# Patient Record
Sex: Male | Born: 1955 | Race: Black or African American | Hispanic: No | Marital: Single | Smoking: Current every day smoker
Health system: Southern US, Community
[De-identification: ages and names within clinical notes are randomized; demographics above are authoritative.]

## PROBLEM LIST (undated history)

## (undated) DIAGNOSIS — I10 Essential (primary) hypertension: Secondary | ICD-10-CM

## (undated) DIAGNOSIS — E785 Hyperlipidemia, unspecified: Secondary | ICD-10-CM

## (undated) DIAGNOSIS — Z9989 Dependence on other enabling machines and devices: Secondary | ICD-10-CM

## (undated) DIAGNOSIS — I2510 Atherosclerotic heart disease of native coronary artery without angina pectoris: Secondary | ICD-10-CM

## (undated) DIAGNOSIS — G4733 Obstructive sleep apnea (adult) (pediatric): Secondary | ICD-10-CM

## (undated) DIAGNOSIS — E669 Obesity, unspecified: Secondary | ICD-10-CM

## (undated) HISTORY — DX: Obesity, unspecified: E66.9

## (undated) HISTORY — DX: Atherosclerotic heart disease of native coronary artery without angina pectoris: I25.10

## (undated) HISTORY — DX: Obstructive sleep apnea (adult) (pediatric): G47.33

## (undated) HISTORY — DX: Dependence on other enabling machines and devices: Z99.89

## (undated) HISTORY — DX: Hyperlipidemia, unspecified: E78.5

## (undated) HISTORY — PX: NO PAST SURGERIES: SHX2092

## (undated) HISTORY — PX: CARDIAC CATHETERIZATION: SHX172

## (undated) HISTORY — DX: Essential (primary) hypertension: I10

---

## 2001-02-23 ENCOUNTER — Emergency Department (HOSPITAL_COMMUNITY): Admission: EM | Admit: 2001-02-23 | Discharge: 2001-02-23 | Payer: Self-pay | Admitting: Emergency Medicine

## 2002-01-29 ENCOUNTER — Emergency Department (HOSPITAL_COMMUNITY): Admission: EM | Admit: 2002-01-29 | Discharge: 2002-01-29 | Payer: Self-pay

## 2002-01-30 ENCOUNTER — Encounter: Payer: Self-pay | Admitting: Emergency Medicine

## 2002-01-30 ENCOUNTER — Emergency Department (HOSPITAL_COMMUNITY): Admission: EM | Admit: 2002-01-30 | Discharge: 2002-01-30 | Payer: Self-pay | Admitting: Emergency Medicine

## 2006-05-21 ENCOUNTER — Emergency Department (HOSPITAL_COMMUNITY): Admission: EM | Admit: 2006-05-21 | Discharge: 2006-05-21 | Payer: Self-pay | Admitting: Emergency Medicine

## 2006-06-28 ENCOUNTER — Encounter: Admission: RE | Admit: 2006-06-28 | Discharge: 2006-08-02 | Payer: Self-pay | Admitting: Internal Medicine

## 2006-07-17 ENCOUNTER — Emergency Department (HOSPITAL_COMMUNITY): Admission: EM | Admit: 2006-07-17 | Discharge: 2006-07-17 | Payer: Self-pay | Admitting: Emergency Medicine

## 2006-09-07 ENCOUNTER — Ambulatory Visit: Payer: Self-pay

## 2006-09-07 ENCOUNTER — Encounter: Payer: Self-pay | Admitting: Cardiology

## 2006-10-12 ENCOUNTER — Ambulatory Visit (HOSPITAL_COMMUNITY): Admission: RE | Admit: 2006-10-12 | Discharge: 2006-10-12 | Payer: Self-pay | Admitting: Cardiology

## 2006-10-12 ENCOUNTER — Encounter (INDEPENDENT_AMBULATORY_CARE_PROVIDER_SITE_OTHER): Payer: Self-pay | Admitting: Cardiology

## 2008-01-19 ENCOUNTER — Emergency Department (HOSPITAL_COMMUNITY): Admission: EM | Admit: 2008-01-19 | Discharge: 2008-01-19 | Payer: Self-pay | Admitting: Emergency Medicine

## 2008-04-14 ENCOUNTER — Ambulatory Visit: Payer: Self-pay | Admitting: Cardiology

## 2008-04-14 ENCOUNTER — Inpatient Hospital Stay (HOSPITAL_COMMUNITY): Admission: AD | Admit: 2008-04-14 | Discharge: 2008-04-26 | Payer: Self-pay | Admitting: Cardiology

## 2008-04-14 ENCOUNTER — Encounter: Payer: Self-pay | Admitting: Emergency Medicine

## 2008-04-16 ENCOUNTER — Ambulatory Visit: Payer: Self-pay | Admitting: Thoracic Surgery (Cardiothoracic Vascular Surgery)

## 2008-04-16 ENCOUNTER — Encounter: Payer: Self-pay | Admitting: Surgery

## 2008-04-16 ENCOUNTER — Encounter: Payer: Self-pay | Admitting: Cardiology

## 2008-04-17 ENCOUNTER — Encounter: Payer: Self-pay | Admitting: Cardiothoracic Surgery

## 2008-04-29 ENCOUNTER — Ambulatory Visit: Payer: Self-pay | Admitting: Internal Medicine

## 2008-04-30 ENCOUNTER — Encounter: Payer: Self-pay | Admitting: Internal Medicine

## 2008-04-30 ENCOUNTER — Inpatient Hospital Stay (HOSPITAL_COMMUNITY): Admission: EM | Admit: 2008-04-30 | Discharge: 2008-05-07 | Payer: Self-pay | Admitting: Emergency Medicine

## 2008-05-21 ENCOUNTER — Ambulatory Visit (HOSPITAL_COMMUNITY): Admission: RE | Admit: 2008-05-21 | Discharge: 2008-05-21 | Payer: Self-pay | Admitting: Cardiology

## 2008-05-21 ENCOUNTER — Encounter: Payer: Self-pay | Admitting: Physician Assistant

## 2008-05-21 ENCOUNTER — Ambulatory Visit: Payer: Self-pay | Admitting: Cardiology

## 2008-05-30 ENCOUNTER — Encounter: Admission: RE | Admit: 2008-05-30 | Discharge: 2008-05-30 | Payer: Self-pay | Admitting: Cardiothoracic Surgery

## 2008-05-30 ENCOUNTER — Ambulatory Visit: Payer: Self-pay | Admitting: Cardiothoracic Surgery

## 2008-06-04 ENCOUNTER — Ambulatory Visit: Payer: Self-pay | Admitting: Cardiovascular Disease

## 2008-06-18 ENCOUNTER — Encounter (HOSPITAL_COMMUNITY): Admission: RE | Admit: 2008-06-18 | Discharge: 2008-07-18 | Payer: Self-pay | Admitting: Cardiology

## 2008-07-02 ENCOUNTER — Ambulatory Visit: Payer: Self-pay | Admitting: Cardiology

## 2008-09-22 ENCOUNTER — Ambulatory Visit: Payer: Self-pay | Admitting: Cardiology

## 2008-09-22 ENCOUNTER — Ambulatory Visit (HOSPITAL_COMMUNITY): Admission: RE | Admit: 2008-09-22 | Discharge: 2008-09-22 | Payer: Self-pay | Admitting: Cardiology

## 2008-09-23 LAB — CONVERTED CEMR LAB
Cholesterol: 144 mg/dL
Triglyceride fasting, serum: 229 mg/dL

## 2008-11-16 ENCOUNTER — Ambulatory Visit: Admission: RE | Admit: 2008-11-16 | Discharge: 2008-11-16 | Payer: Self-pay | Admitting: Internal Medicine

## 2008-11-22 ENCOUNTER — Ambulatory Visit: Payer: Self-pay | Admitting: Internal Medicine

## 2008-12-22 ENCOUNTER — Ambulatory Visit: Payer: Self-pay | Admitting: Cardiology

## 2009-02-01 ENCOUNTER — Ambulatory Visit: Admission: RE | Admit: 2009-02-01 | Discharge: 2009-02-01 | Payer: Self-pay | Admitting: Internal Medicine

## 2009-02-08 ENCOUNTER — Ambulatory Visit: Payer: Self-pay | Admitting: Internal Medicine

## 2009-05-06 ENCOUNTER — Encounter (INDEPENDENT_AMBULATORY_CARE_PROVIDER_SITE_OTHER): Payer: Self-pay | Admitting: *Deleted

## 2009-05-12 DIAGNOSIS — I10 Essential (primary) hypertension: Secondary | ICD-10-CM | POA: Insufficient documentation

## 2009-05-12 DIAGNOSIS — G4733 Obstructive sleep apnea (adult) (pediatric): Secondary | ICD-10-CM

## 2009-05-12 DIAGNOSIS — E119 Type 2 diabetes mellitus without complications: Secondary | ICD-10-CM

## 2009-05-12 DIAGNOSIS — I5032 Chronic diastolic (congestive) heart failure: Secondary | ICD-10-CM

## 2009-05-12 DIAGNOSIS — E785 Hyperlipidemia, unspecified: Secondary | ICD-10-CM

## 2009-05-12 DIAGNOSIS — E663 Overweight: Secondary | ICD-10-CM | POA: Insufficient documentation

## 2009-05-12 DIAGNOSIS — E782 Mixed hyperlipidemia: Secondary | ICD-10-CM | POA: Insufficient documentation

## 2009-06-04 ENCOUNTER — Encounter (INDEPENDENT_AMBULATORY_CARE_PROVIDER_SITE_OTHER): Payer: Self-pay | Admitting: *Deleted

## 2009-07-20 ENCOUNTER — Telehealth (INDEPENDENT_AMBULATORY_CARE_PROVIDER_SITE_OTHER): Payer: Self-pay | Admitting: *Deleted

## 2009-08-21 ENCOUNTER — Ambulatory Visit: Payer: Self-pay | Admitting: Cardiology

## 2009-08-21 ENCOUNTER — Encounter (INDEPENDENT_AMBULATORY_CARE_PROVIDER_SITE_OTHER): Payer: Self-pay | Admitting: *Deleted

## 2009-08-21 DIAGNOSIS — I2510 Atherosclerotic heart disease of native coronary artery without angina pectoris: Secondary | ICD-10-CM | POA: Insufficient documentation

## 2009-08-21 LAB — CONVERTED CEMR LAB
AST: 12 units/L
Albumin: 4.3 g/dL
BUN: 34 mg/dL
CO2: 21 meq/L
Calcium: 9.5 mg/dL
Chloride: 101 meq/L
Creatinine, Ser: 1.041 mg/dL
HCT: 38.1 %
Platelets: 229 10*3/uL
Potassium: 4.9 meq/L
Sodium: 134 meq/L
TSH: 1.029 microintl units/mL
Total Protein: 8 g/dL
WBC: 9.6 10*3/uL

## 2009-09-02 ENCOUNTER — Telehealth (INDEPENDENT_AMBULATORY_CARE_PROVIDER_SITE_OTHER): Payer: Self-pay | Admitting: *Deleted

## 2009-09-15 ENCOUNTER — Encounter: Payer: Self-pay | Admitting: Cardiology

## 2009-09-15 ENCOUNTER — Encounter (INDEPENDENT_AMBULATORY_CARE_PROVIDER_SITE_OTHER): Payer: Self-pay | Admitting: *Deleted

## 2009-09-15 LAB — CONVERTED CEMR LAB
ALT: 9 units/L
AST: 11 units/L
Alkaline Phosphatase: 88 units/L
BUN: 26 mg/dL
Brain Natriuretic Peptide: 87.8
Creatinine, Ser: 1.24 mg/dL
Total Protein: 7.7 g/dL

## 2009-09-16 ENCOUNTER — Encounter (INDEPENDENT_AMBULATORY_CARE_PROVIDER_SITE_OTHER): Payer: Self-pay | Admitting: *Deleted

## 2009-09-16 LAB — CONVERTED CEMR LAB
ALT: 9 units/L (ref 0–53)
AST: 11 units/L (ref 0–37)
Albumin: 4.2 g/dL (ref 3.5–5.2)
Alkaline Phosphatase: 88 units/L (ref 39–117)
Glucose, Bld: 175 mg/dL — ABNORMAL HIGH (ref 70–99)
Potassium: 5.4 meq/L — ABNORMAL HIGH (ref 3.5–5.3)
Sodium: 137 meq/L (ref 135–145)
Total Protein: 7.7 g/dL (ref 6.0–8.3)

## 2009-09-21 ENCOUNTER — Ambulatory Visit: Payer: Self-pay | Admitting: Cardiology

## 2009-10-27 ENCOUNTER — Encounter (INDEPENDENT_AMBULATORY_CARE_PROVIDER_SITE_OTHER): Payer: Self-pay | Admitting: *Deleted

## 2009-10-27 LAB — CONVERTED CEMR LAB
BUN: 21 mg/dL
BUN: 21 mg/dL (ref 6–23)
CO2: 25 meq/L
CO2: 25 meq/L (ref 19–32)
Calcium: 9.4 mg/dL
Calcium: 9.4 mg/dL (ref 8.4–10.5)
Creatinine, Ser: 1.23 mg/dL (ref 0.40–1.50)
Glucose, Bld: 125 mg/dL
Glucose, Bld: 125 mg/dL — ABNORMAL HIGH (ref 70–99)
Sodium: 138 meq/L (ref 135–145)

## 2009-10-29 ENCOUNTER — Encounter (INDEPENDENT_AMBULATORY_CARE_PROVIDER_SITE_OTHER): Payer: Self-pay | Admitting: *Deleted

## 2010-04-01 ENCOUNTER — Encounter (INDEPENDENT_AMBULATORY_CARE_PROVIDER_SITE_OTHER): Payer: Self-pay | Admitting: *Deleted

## 2010-04-09 ENCOUNTER — Encounter (INDEPENDENT_AMBULATORY_CARE_PROVIDER_SITE_OTHER): Payer: Self-pay | Admitting: *Deleted

## 2010-04-09 ENCOUNTER — Ambulatory Visit: Payer: Self-pay | Admitting: Cardiology

## 2010-04-13 ENCOUNTER — Telehealth (INDEPENDENT_AMBULATORY_CARE_PROVIDER_SITE_OTHER): Payer: Self-pay | Admitting: *Deleted

## 2010-04-13 ENCOUNTER — Encounter (INDEPENDENT_AMBULATORY_CARE_PROVIDER_SITE_OTHER): Payer: Self-pay | Admitting: *Deleted

## 2010-04-13 LAB — CONVERTED CEMR LAB
ALT: 9 units/L (ref 0–53)
AST: 10 units/L (ref 0–37)
Albumin: 4 g/dL (ref 3.5–5.2)
CO2: 25 meq/L (ref 19–32)
Calcium: 9.6 mg/dL (ref 8.4–10.5)
Chloride: 103 meq/L (ref 96–112)
Cholesterol: 131 mg/dL (ref 0–200)
Creatinine, Ser: 1.26 mg/dL (ref 0.40–1.50)
Eosinophils Absolute: 0.2 10*3/uL (ref 0.0–0.7)
Eosinophils Relative: 3 % (ref 0–5)
HCT: 39.8 % (ref 39.0–52.0)
Lymphocytes Relative: 55 % — ABNORMAL HIGH (ref 12–46)
Lymphs Abs: 4.3 10*3/uL — ABNORMAL HIGH (ref 0.7–4.0)
MCV: 85.4 fL (ref 78.0–100.0)
Monocytes Relative: 9 % (ref 3–12)
Neutrophils Relative %: 33 % — ABNORMAL LOW (ref 43–77)
Potassium: 5.3 meq/L (ref 3.5–5.3)
RBC: 4.66 M/uL (ref 4.22–5.81)
Total CHOL/HDL Ratio: 4.4
WBC: 7.9 10*3/uL (ref 4.0–10.5)

## 2010-06-12 ENCOUNTER — Encounter (INDEPENDENT_AMBULATORY_CARE_PROVIDER_SITE_OTHER): Payer: Self-pay | Admitting: *Deleted

## 2010-06-12 LAB — CONVERTED CEMR LAB
BUN: 19 mg/dL
BUN: 19 mg/dL (ref 6–23)
CO2: 28 meq/L
CO2: 28 meq/L (ref 19–32)
Calcium: 9.3 mg/dL
Chloride: 105 meq/L (ref 96–112)
Creatinine, Ser: 1.27 mg/dL
Glucose, Bld: 120 mg/dL
Glucose, Bld: 120 mg/dL — ABNORMAL HIGH (ref 70–99)
Potassium: 5.2 meq/L (ref 3.5–5.3)
Sodium: 139 meq/L
Sodium: 139 meq/L (ref 135–145)

## 2010-06-14 ENCOUNTER — Encounter (INDEPENDENT_AMBULATORY_CARE_PROVIDER_SITE_OTHER): Payer: Self-pay | Admitting: *Deleted

## 2010-08-02 ENCOUNTER — Encounter (INDEPENDENT_AMBULATORY_CARE_PROVIDER_SITE_OTHER): Payer: Self-pay | Admitting: *Deleted

## 2010-08-11 ENCOUNTER — Telehealth (INDEPENDENT_AMBULATORY_CARE_PROVIDER_SITE_OTHER): Payer: Self-pay | Admitting: *Deleted

## 2010-08-13 ENCOUNTER — Encounter (INDEPENDENT_AMBULATORY_CARE_PROVIDER_SITE_OTHER): Payer: Self-pay | Admitting: *Deleted

## 2010-08-23 ENCOUNTER — Ambulatory Visit (HOSPITAL_COMMUNITY): Admission: RE | Admit: 2010-08-23 | Discharge: 2010-08-23 | Payer: Self-pay | Admitting: Oral Surgery

## 2010-10-01 ENCOUNTER — Encounter: Payer: Self-pay | Admitting: Cardiology

## 2010-11-29 ENCOUNTER — Encounter: Payer: Self-pay | Admitting: Cardiothoracic Surgery

## 2010-12-09 NOTE — Assessment & Plan Note (Signed)
Summary: 6 mth f/u per checkout on 08/21/09/tg  Medications Added LYRICA 100 MG CAPS (PREGABALIN) take 1 tab three times a day METFORMIN HCL 1000 MG TABS (METFORMIN HCL) take 1 tab two times a day ASPIRIN 81 MG TBEC (ASPIRIN) Take one tablet by mouth daily FUROSEMIDE 20 MG TABS (FUROSEMIDE) take 1 tab daily COMBIVENT 18-103 MCG/ACT AERO (IPRATROPIUM-ALBUTEROL) take 2 puffs two times a day      Allergies Added: NKDA  Visit Type:  Follow-up Primary Provider:  Dr. Concepcion Elk   History of Present Illness: [name] returns to the office as scheduled for continued assessment and treatment of coronary disease and cardiovascular risk factors.  He has done generally well, but does note daily edema, more prominent in the right leg, that resolves overnight.  He has occasional episodes of a sharp discomfort across the anterior mid chest lasting matter of seconds.  Lifestyle is sedentary, but he experiences some dyspnea with exertion and with bending.  Nonetheless, he does some Lobbyist and restoration.   Current Medications (verified): 1)  Diovan 320 Mg Tabs (Valsartan) .... Take 1 Tablet By Mouth Once A Day 2)  Lyrica 100 Mg Caps (Pregabalin) .... Take 1 Tab Three Times A Day 3)  Metformin Hcl 1000 Mg Tabs (Metformin Hcl) .... Take 1 Tab Two Times A Day 4)  Simvastatin 40 Mg Tabs (Simvastatin) .... Take 1 Tab Daily 5)  Glimepiride 4 Mg Tabs (Glimepiride) .... Take 1 Tab Two Times A Day 6)  Aspirin 81 Mg Tbec (Aspirin) .... Take One Tablet By Mouth Daily 7)  Toprol Xl 50 Mg Xr24h-Tab (Metoprolol Succinate) .... Take One  and One Half Tablets By Mouth Daily 8)  Furosemide 20 Mg Tabs (Furosemide) .... Take 1 Tab Daily 9)  Combivent 18-103 Mcg/act Aero (Ipratropium-Albuterol) .... Take 2 Puffs Two Times A Day  Allergies (verified): No Known Drug Allergies  Past History:  PMH, FH, and Social History reviewed and updated.  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  Social History: Disabled  Single  Tobacco Use - 40 pack years; quit in 6/09 Alcohol Use -remote excessive use  Review of Systems       See history of present illness.  Vital Signs:  Patient profile:   55 year old male Weight:      315 pounds Pulse rate:   80 / minute BP sitting:   123 / 79  (right arm)  Vitals Entered By: Dreama Saa, CNA (April 09, 2010 3:02 PM)  Physical Exam  General:    Well developed; no acute distress; obese   Neck-No JVD; no carotid bruits: Lungs-No tachypnea, no rales; no rhonchi; no wheezes; decreased breath sounds at the bases  Thorax- keloid formation in median sternotomy scar Cardiovascular-normal PMI; normal S1 and S2: Abdomen-BS normal; soft and non-tender without masses or organomegaly:  Musculoskeletal-No deformities, no cyanosis or clubbing: Neurologic-Normal cranial nerves; symmetric strength and tone:  Skin-Warm, no significant lesions; scattered minor scars over the trunk Extremities-intact distal pulses; 1+ ankle edema on the right and 1/2+ on the left.     Impression & Recommendations:  Problem # 1:   ATHEROSCLEROTIC CARDIOVASCULAR DISEASE (ICD-429.2) Patient has done well with respect to coronary disease.  He is currently asymptomatic with nothing to suggest ischemia.  Aspirin dosage will be reduced to 81 mg q.d.  Problem # 2:  OBSTRUCTIVE SLEEP APNEA (ICD-327.23)  He has what sounds like a BiPAP device, but uses it only intermittently.  After one month of steady usage, he noted no change in his sense of well-being.  He does not experience daytime somnolence.  Problem # 3:  OVERWEIGHT/OBESITY (ICD-278.02) He has lost 13 pounds since his last visit, which he accomplished by reducing beer consumption.  He also drinks only artificially sweetened beverages and avoids salt and high caloric items.  I suggested that he eliminate fried and fatty foods from his diet.  Problem # 4:  HYPERTENSION, UNSPECIFIED (ICD-401.9) Blood pressure control is good; current medication can be continued.  Problem # 5:  HYPERLIPIDEMIA-MIXED (ICD-272.4) Lipid profile and chemistry profile are pending.  I will plan to see this very nice gentleman again in one year.  Other Orders: Future Orders: T-Lipid Profile ([id number]) ... 04/12/2010 T-Comprehensive Metabolic Panel [phone]) ... 04/12/2010 T-CBC w/Diff ([id number]) ... 04/12/2010  Patient Instructions: 1)  Your physician recommends that you schedule a follow-up appointment in: 1 yr 2)  Your physician recommends that you return for lab work in: next week 3)  Your physician has recommended you make the following change in your medication: decrease aspirin to 81mg  daily b

## 2010-12-09 NOTE — Letter (Signed)
Summary: [place] [facility], agricultural at [employer]  [address] [place] [address]   Phone: [phone]  Fax: [phone]     April 13, 2010 MRN: [id number]   [name] [address] [place], [place]  [address]      YOUR LAB WORK IS DUE   __________August 8, 2011_______________________________  Please go to [facility], located across the street from [facility] on the second floor.  Hours are Monday - Friday 7am until 7:30pm         Saturday 8am until 12noon    __  DO NOT EAT OR DRINK AFTER MIDNIGHT EVENING PRIOR TO LABWORK  _x_ YOUR LABWORK IS NOT FASTING --YOU MAY EAT PRIOR TO LABWORK

## 2010-12-09 NOTE — Miscellaneous (Signed)
Summary: labs bmp,06/12/2010  Clinical Lists Changes  Observations: Added new observation of CALCIUM: 9.3 mg/dL (16/08/9603 5:40) Added new observation of CREATININE: 1.27 mg/dL (98/09/9146 8:29) Added new observation of BUN: 19 mg/dL (56/21/3086 5:78) Added new observation of BG RANDOM: 120 mg/dL (46/96/2952 8:41) Added new observation of CO2 PLSM/SER: 28 meq/L (06/12/2010 8:48) Added new observation of CL SERUM: 105 meq/L (06/12/2010 8:48) Added new observation of K SERUM: 5.2 meq/L (06/12/2010 8:48) Added new observation of NA: 139 meq/L (06/12/2010 8:48)

## 2010-12-09 NOTE — Progress Notes (Signed)
   Phone Note Call from Patient   Caller: [name] Call For: nurse Reason for Call: Refill Medication Details for Reason: called to tell us that his recent Rx had not been received by the pharmacy. Summary of Call: [name] called to tell the nurse that his most recent Rx had not been received by the pharmacy as of today. Initial call taken by: Enzo Montgomery. Clovis Riley  Follow-up for Phone Call        [name] to [facility] diovan to pts house Follow-up by: Teressa Lower RN,  April 13, 2010 5:06 PM

## 2010-12-09 NOTE — Letter (Signed)
Summary: Clearance Letter  [facility]  [address], [place] [address]   Phone: [phone]  Fax: [phone]    August 13, 2010  Re:     [name] Address:   [address]     [place], [place]  [address] DOB:     02/23/1956 MRN:     [id number]   Dear Dr. Ocie Doyne:       [name] is cleared for dental extractions with sedation.  He   is a low cardiac risk.  He should continue all his cardiac medications   as usual.  Please feel free to contact us if we can be of assistance.      Sincerely,  Joni Reining, NP

## 2010-12-09 NOTE — Miscellaneous (Signed)
Summary: labs cmp,bnp,09/15/2009  Clinical Lists Changes  Observations: Added new observation of CALCIUM: 9.4 mg/dL (16/08/9603 5:40) Added new observation of CREATININE: 1.23 mg/dL (98/09/9146 8:29) Added new observation of BUN: 21 mg/dL (56/21/3086 5:78) Added new observation of BG RANDOM: 125 mg/dL (46/96/2952 8:41) Added new observation of CO2 PLSM/SER: 25 meq/L (10/27/2009 8:48) Added new observation of CL SERUM: 103 meq/L (10/27/2009 8:48) Added new observation of K SERUM: 5.3 meq/L (10/27/2009 8:48) Added new observation of NA: 138 meq/L (10/27/2009 8:48) Added new observation of CALCIUM: 9.7 mg/dL (32/44/0102 7:25) Added new observation of ALBUMIN: 4.2 g/dL (36/64/4034 7:42) Added new observation of PROTEIN, TOT: 7.7 g/dL (59/56/3875 6:43) Added new observation of SGPT (ALT): 9 units/L (09/15/2009 8:48) Added new observation of SGOT (AST): 11 units/L (09/15/2009 8:48) Added new observation of ALK PHOS: 88 units/L (09/15/2009 8:48) Added new observation of CREATININE: 1.24 mg/dL (32/95/1884 1:66) Added new observation of BUN: 26 mg/dL (05/06/1600 0:93) Added new observation of BG RANDOM: 175 mg/dL (23/55/7322 0:25) Added new observation of CO2 PLSM/SER: 22 meq/L (09/15/2009 8:48) Added new observation of CL SERUM: 101 meq/L (09/15/2009 8:48) Added new observation of K SERUM: 5.4 meq/L (09/15/2009 8:48) Added new observation of NA: 137 meq/L (09/15/2009 8:48) Added new observation of BNP: 87.8  (09/15/2009 8:48)

## 2010-12-09 NOTE — Letter (Signed)
Summary: Ocie Doyne DMD OFFICE NOTES  SCOTT JENSEN DMD OFFICE NOTES   Imported By: Faythe Ghee 10/01/2010 13:52:56  _____________________________________________________________________  External Attachment:    Type:   Image     Comment:   External Document

## 2010-12-09 NOTE — Progress Notes (Signed)
Summary: Speak with nurse   Phone Note Call from Patient   Caller: Patient Reason for Call: Talk to Nurse Summary of Call: pt states that Dr.Scott Jensen's office was gonna send [place] information regarding being "put to sleep" for teeth extraction / explained to him that we have not yet received that so he wanted to speak with nurse /tg Initial call taken by: Raechel Ache [facility],  August 11, 2010 1:20 PM     Appended Document: Speak with nurse clearance letter generated and faxed to Dr. Barbette Merino

## 2010-12-09 NOTE — Letter (Signed)
Summary: [place] [facility], agricultural at [facility]  [address] [place] [address]   Phone: [phone]  Fax: [phone]      August 02, 2010 MRN: [id number]   DOC MANDALA [address] [place], [place]  [address]   Dear Mr. Gellis,  Your test ordered by Selena Batten has been reviewed by your physician (or physician assistant) and was found to be normal or stable. Your physician (or physician assistant) felt no changes were needed at this time.  ____ Echocardiogram  ____ Cardiac Stress Test  __x__ Lab Work  ____ Peripheral vascular study of arms, legs or neck  ____ CT scan or X-ray  ____ Lung or Breathing test  ____ Other:  No change in medical treatment at this time, per Dr. Dietrich Pates.  Enclosed is a copy of your labwork for your records.  Thank you, Tammy Allyne Gee RN    Sanger Bing, MD, Lenise Arena.C.Gaylord Shih, MD, F.A.C.C Lewayne Bunting, MD, F.A.C.C Nona Dell, MD, F.A.C.C Charlton Haws, MD, Lenise Arena.C.C

## 2010-12-09 NOTE — Letter (Signed)
Summary: [place] [facility] Work [facility], agricultural at [employer]  [address], [place] [address]   Phone: [phone]  Fax: [phone]     April 09, 2010 MRN: [id number]   MARQUAVIOUS NAZAR [address] [place], [place]  [address]      YOUR LAB WORK IS DUE   ____________MONDDAY   JUNE 6, 2011____________________________  Please go to [facility], located across the street from [facility] on the second floor.  Hours are Monday - Friday 7am until 7:30pm         Saturday 8am until 12noon    _X_  DO NOT EAT OR DRINK AFTER MIDNIGHT EVENING PRIOR TO LABWORK  __ YOUR LABWORK IS NOT FASTING --YOU MAY EAT PRIOR TO LABWORK

## 2010-12-09 NOTE — Letter (Signed)
Summary: [place] Results Engineer, agricultural at [facility]  [address], [place] [address]   Phone: [phone]  Fax: [phone]      April 13, 2010 MRN: [id number]   [name] [address] [place], [place]  [address]   Dear [name],  Your test ordered by Selena Batten has been reviewed by your physician (or physician assistant) and was found to be normal or stable. Your physician (or physician assistant) felt no changes were needed at this time.  ____ Echocardiogram  ____ Cardiac Stress Test  __X__ Lab Work  ____ Peripheral vascular study of arms, legs or neck  ____ CT scan or X-ray  ____ Lung or Breathing test  ____ Other: Please decrease the amount of potassium in your diet.  Enclosed is a copy of  your labwork for your records and a list of food high in potassium to avoid in your diet.  We will recheck more labwork in August.  Thank you, Teressa Lower RN    Glen Bing, MD, F.A.C.Gaylord Shih, MD, F.A.C.C Lewayne Bunting, MD, F.A.C.C Nona Dell, MD, F.A.C.C Charlton Haws, MD, Lenise Arena.C.C

## 2011-01-19 LAB — GLUCOSE, CAPILLARY: Glucose-Capillary: 156 mg/dL — ABNORMAL HIGH (ref 70–99)

## 2011-01-20 LAB — BASIC METABOLIC PANEL
BUN: 21 mg/dL (ref 6–23)
CO2: 30 mEq/L (ref 19–32)
Calcium: 9.6 mg/dL (ref 8.4–10.5)
Chloride: 103 mEq/L (ref 96–112)
Creatinine, Ser: 1.17 mg/dL (ref 0.4–1.5)
GFR calc Af Amer: >60 mL/min (ref >60)
Glucose, Bld: 125 mg/dL — ABNORMAL HIGH (ref 70–99)

## 2011-01-20 LAB — CBC
MCH: 26.9 pg (ref 26.0–34.0)
MCHC: 32.3 g/dL (ref 30.0–36.0)
MCV: 83.4 fL (ref 78.0–100.0)
Platelets: 195 10*3/uL (ref 150–400)
RBC: 5.05 MIL/uL (ref 4.22–5.81)

## 2011-03-22 NOTE — Op Note (Signed)
NAMELAEL, PILCH NO.:  [id number]   MEDICAL RECORD NO.:  [id number]           PATIENT TYPE:   LOCATION:                                 FACILITY:   PHYSICIAN:  Kerin Perna, M.D.  DATE OF BIRTH:  January 23, 1956   DATE OF PROCEDURE:  04/17/2008  DATE OF DISCHARGE:                               OPERATIVE REPORT   OPERATION:  1. Coronary artery bypass grafting x4 (left internal mammary artery      LAD, saphenous vein graft to ramus intermediate, saphenous vein      graft to circumflex marginal, and saphenous vein graft to right      coronary artery).  2. Coronary endarterectomy of the left anterior descending  3. Endoscopic saphenous vein harvest of the right leg.  4. Placement of left subclavian triple-lumen catheter for IV access.   SURGEON:  Kerin Perna, MD   ASSISTANTS:  1. Doree Fudge, PA  2. Rowe Clack, PA-C   ANESTHESIA:  General.   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   CLINICAL NOTE:  The patient is a 55 year old disabled diabetic smoker.  He has had progressive dyspnea and chest pain on exertion.  He was  admitted to the hospital and ruled out for MI, but a cardiac cath by Dr.  Gala Romney demonstrated severe critical left main stenosis of 95% with  subpleural stenosis of the right coronary and severe disease of the  circumflex circulation.  His EF was 45%, and he was felt to be candidate  for surgical evaluation.  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  However, it was felt that the anatomic stenosis of the  left main was so great that surgery should proceed without the benefit  of a Plavix washout of a few days.   I examined the patient in the CCU the evening prior surgery and reviewed  the results of his  cardiac cath with the patient.  I discussed the  indications, the benefits, and risks of coronary artery bypass grafting  as well as the alternatives to surgery.  He understood the major aspects  of the procedure including the location of the surgical incisions, the  use of general anesthesia and cardiopulmonary bypass, and the expected  postoperative recovery.  He understood due to his chronic smoking,  obesity, and diabetes that he would be at increased risk for  postoperative pulmonary problems as well as infections.  He also  understood that he would be at a risk for bleeding, blood transfusion  requirement, stroke, MI, and death.  He understood these issues and  agreed to proceed with surgery under what I felt was an informed  consent.   OPERATIVE FINDINGS:  The patient's body habitus made exposure of the  heart difficult.  The aorta was very short.  The heart was enlarged.  The right atrium was fairly  posterior.  There was a question of a patent  foramen ovale on an echo done in 2007; however, the TEE down in the  operating room showed the patent foramen ovale to be extremely small in  the range of 2-3 mm at most.  For that reason, it was not closed.  The  patient did require blood transfusion during surgery.  He also required  platelets and FFP after reversal of the heparin with protamine due to  persistent coagulation abnormalities from long-term Plavix therapy.  Due  to his diffuse and severe coronary disease which required a  endarterectomy of the LAD, his foreshortened aorta, and difficult vessel  exposure, redo surgery would be at extremely high risk if at all  possible.   PROCEDURE:  The patient was brought to the operative room, placed on the  operating table where general anesthesia was induced.  The chest,  abdomen, and legs were prepped with Betadine and draped as a sterile  field.  A sternal incision was made as the saphenous vein was harvested  endoscopically from  the right leg.  The left internal mammary artery was  harvested as a pedicle graft from its origin at the subclavian vessels.  There was a good vessel with excellent flow.  After the saphenous vein  had been adequately harvested, inspected, and found to be adequate, the  patient was heparinized.  The pericardium was opened and suspended.  Pursestrings were placed in the right atrium and in the ascending aorta,  and the patient was cannulated and placed on bypass.  Dual venous  drainage was used.  The coronaries were then identified and dissected  for grafting.  Cardioplegia catheters were placed for both antegrade and  retrograde cold blood cardioplegia, and the patient was cooled to 32  degrees.  The mammary artery and vein grafts were prepared for the  distal anastomoses.  The aortic cross-clamp was then applied and 1 L of  cold blood cardioplegia was delivered in split doses between the  antegrade aortic and retrograde coronary sinus catheters.  There was  good cardioplegic arrest and septal temperature dropped less than 15  degrees.  Cardioplegia was then delivered every 20 minutes or less while  the cross-clamp was applied.   The distal coronary anastomoses were then performed.  The first distal  anastomosis was to the distal RCA.  This was placed at the large RV  marginal branch with free communication to the vein channel of the right  coronary which was heavily calcified and was a poor target for placement  of the anastomosis.  A reverse saphenous vein measuring 4 mm in diameter  was sewn to the 2.0-mm coronary artery using running 7-0 Prolene.  There  was good flow through the graft.  The second distal anastomosis was to  the circumflex marginal.  This had a proximal 90% stenosis and was a 1.5-  mm diameter vessel.  A reverse saphenous vein which was approximately 4  mm in diameter was sewn end to side with running 7-0 Prolene with good  flow through the graft.  Cardioplegia was  redosed.  The third distal  anastomosis was to the ramus branch of the left coronary.  This had a  proximal left main stenosis as well as a proper vessel 70% stenosis, and  the reverse saphenous vein was sewn end to side with running 7-0  Prolene.  The fourth distal anastomosis was to the distal LAD.  It was  heavily calcified and deeply intramyocardial  more proximally.  After an  attempt to perform the anastomosis with the calcified plaque present and  being unsuccessful, I proceeded with a coronary endarterectomy of the  LAD with a good anatomic dissection of the intramural plaque which  feathered off to a tapered end distally.  The endarterectomy was  completed both distally and proximally, and the specimen submitted to  pathology.  After the plaque had been removed, the anastomosis was much  more straight forward and possible, and the left IMA was sewn end to  side with running 8-0 Prolene to the endarterectomized distal LAD and  there was excellent flow through the graft.  Cardioplegia was redosed,  and the pedicle was secured to the epicardium with 6-0 Prolene sutures.   After cardioplegia was redosed, the 3 proximal vein anastomoses were  placed on the ascending aorta using a 4.5-mm punch and running 6-0  Prolene.  Prior to tying down the final proximal anastomosis, air was  vented from the coronaries with a dose of retrograde warm blood  cardioplegia.  The final proximal anastomosis was tied.  The cross-clamp  was removed.  Air was aspirated from the vein graft.  These were checked  and had good flow and hemostasis was documented at the proximal distal  anastomoses.  The cardioplegia catheters were removed.  The patient was  rewarmed to 37 degrees.  The heart resumed a spontaneous rhythm.  Temporary pacing wires were applied.  The patient had been adequately  rewarmed and reperfused.  The lungs were re-expanded.  Ventilator was  resumed.  The patient was then weaned from bypass.   This was performed  on low-dose dopamine.  There was a good cardiac output and stable blood  pressure.  The transesophageal echo showed preserved LV function.  Protamine was administered without adverse reaction.  There was still  considerable coagulopathy from his preoperative Plavix, and the patient  received FFP and platelets with improvement in the coagulation function.  The mediastinum was irrigated with warm antibiotic irrigation.  The leg  incision had some persistent bleeding and was packed until after the  protamine and platelets had been administered.  The superior pericardial  fat was closed over the aorta and vein grafts.  Two mediastinal and a  left pleural chest tube were placed and brought out through separate  incisions.  The sternum was closed with interrupted steel wire.  The  pectoralis fascia was closed with a running #1 Vicryl.  The subcutaneous  and skin layers were closed in running Vicryl and sterile dressings were  applied.  I placed a left subclavian triple-lumen catheter before taking  the drapes off to provide this obese diabetic man with good central  venous access.   We then examined the leg which was still bleeding.  The distal leg  incision below the knee was opened and extended several centimeters.  There was some bleeding and a side branch which was a perforating vein  into the muscle compartment in the calf.  This was oversewn with some  figure-of-eight transfixion sutures which controlled the bleeding.  Next, the incision at the knee was opened more proximally due to  persistent bleeding.  There is some side branches here that were also  oversewn with a 4-0 Prolene transfixion suture.  These incisions were  then closed with interrupted 0 Vicryl for the subcutaneous layer and  interrupted 3-0 nylon for the skin.  Sterile dressings were then applied  on all surgical incisions, and the patient was transferred  the ICU in  critical but stable condition.   Total cardiopulmonary bypass time was  212 minutes with a cross-clamp time of 120 minutes.      Kerin Perna, M.D.  Electronically Signed     PV/MEDQ  D:  04/17/2008  T:  04/18/2008  Job:  [id number]   cc:   Bevelyn Buckles. Bensimhon, MD

## 2011-03-22 NOTE — Procedures (Signed)
NAMEJADARIAN, MCKAY              ACCOUNT NO.:  [id number]   MEDICAL RECORD NO.:  [id number]          PATIENT TYPE:  OUT   LOCATION:  SLEEP CENTER                 FACILITY:  [facility]   PHYSICIAN:  Clinton D. Maple Hudson, MD, FCCP, FACPDATE OF BIRTH:  1956-01-04   DATE OF STUDY:  02/01/2009                            NOCTURNAL POLYSOMNOGRAM   REFERRING PHYSICIAN:  Fleet Contras, M.D.   INDICATIONS FOR STUDY:  Hypersomnia with sleep apnea.   EPWORTH SLEEPINESS SCORE:  Epworth sleepiness score 2/24.  BMI 40.6.  Weight 308 pounds.  Height 73 inches.  Neck 18.5 inches.   MEDICATIONS:  Home medications are charted and reviewed.  A diagnostic  NPSG on November 16, 2008, recorded an AHI of 80.1 per hour.  CPAP  titration is requested.   SLEEP ARCHITECTURE:  Total sleep time 197 minutes with sleep efficiency  of 51.2%.  Stage I was 6.3%.  Stage II 54.7%.  Stage III 29.9%.  REM  9.1% of total sleep time.  Sleep latency 57 minutes.  REM latency 22  minutes.  Awake after sleep onset 131 minutes.  Arousal index 4.9.  No  bedtime medication was taken.  He was awake from approximately 1:15 a.m.  until 3:15 a.m.   RESPIRATORY DATA:  CPAP titration protocol.  CPAP was titrated to a  final pressure of 16 CWP.  AHI 80 per hour.  CPAP pressure was taken as  high as 18 CWP, AHI 90 per hour.  BiPAP was tried briefly at inspiratory  19, expiratory 15 CWP, AHI 75 per hour.  Best pressure and time response  recorded during this study appears to have been had CPAP of 8 CWP.  Recorded for 45 minutes with an AHI of 5.5 per hour.  The technician  commented that he did not tolerate CPAP well at 15 CWP, recorded for 19  minutes with an AHI of 14 per hour.  He had trouble with mouth breathing  despite using a chin strap.  He wore a medium ResMed Swift nasal pillows  mask.   OXYGEN DATA:  Moderate snoring with oxygen desaturation to a nadir of  85%.  Mean oxygen saturation through this CPAP titration was 92.6% on  room air.   CARDIAC DATA:  Sinus rhythm with occasional PVC.   MOVEMENT/PARASOMNIA:  No significant movement disturbance.   IMPRESSION/RECOMMENDATIONS:  1. The technician had difficulty obtaining optimum continuous positive      airway pressure/bilevel control.  The patient was wearing a nasal      pillows mask, medium ResMed Swift, with chin strap and heated      humidifier.  He may do better with a nasal oral mask design, since      apparently part of the problem was mouth breathing.  Suggest an      initial home trial at 8 CWP (AHI 5.5) and reevaluation for mask      style and size by home care      company.  He can be retitrated for secondary consideration of      optimum pressure, as he adapts to CPAP use.  2. A baseline diagnostic nocturnal polysomnogram on November 16, 2008  recorded an AHI of 80.1 per hour.      Clinton D. Maple Hudson, MD, [facility], FACP  Diplomate, Biomedical engineer of Sleep Medicine  Electronically Signed     CDY/MEDQ  D:  02/06/2009 12:55:56  T:  02/06/2009 22:45:19  Job:  [id number]

## 2011-03-22 NOTE — Assessment & Plan Note (Signed)
[facility]                       [place] CARDIOLOGY OFFICE NOTE   DONEVAN, BILLER                     MRN:          [id number]  DATE:07/02/2008                            DOB:          [birth year]    REFERRING PHYSICIAN:  Fleet Contras, MD   HISTORY OF PRESENT ILLNESS:  Mr. Lipschutz returns to the office, now  nearly 3 months following CABG surgery.  His postoperative course was  complicated by rehospitalization for fluid retention.  He has done well  since his subsequent discharge more than a month ago.  He reports no  chest discomfort nor dyspnea.  He has some numbness in the right leg.  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  He is also concerned about black stools.   CURRENT MEDICATION:  1. KCl 20 mEq b.i.d.  2. Metoprolol 25 mg b.i.d.  3. Furosemide 40 mg b.i.d.  4. Diovan 160 mg daily.  5. Glimepiride 4 mg daily.  6. Simvastatin 20 mg daily.  7. Lyrica 50 mg t.i.d.  8. Iron 150 mg b.i.d.   PHYSICAL EXAMINATION:  GENERAL:  Soft spoken gentleman with flat affect,  in no acute distress.  VITAL SIGNS:  The weight is 277, 20 pounds less than 5 weeks ago.  Blood  pressure 105/65, heart rate 80 and regular, respirations 14.  NECK:  No jugular venous distention.  LUNGS:  Few bibasilar rales.  CARDIAC:  Normal first and second heart sounds; fourth heart sound  present.  ABDOMEN:  Soft and nontender; no organomegaly.  EXTREMITIES:  No edema.   IMPRESSION:  Mr. Peruski is recovering well from bypass surgery.  I  told him that he could plan to return to work in 1-2 months, but he does  not seem inclined to do this.  We will stop his iron supplement after  his current prescription, which should permit his stool to return to a  normal color.  His simvastatin dose will be increased to 40 mg daily.  Lab tests will be obtained in 2-month with a return visit in 5 months.    Gerrit Friends. Dietrich Pates, MD, [facility]  Electronically Signed   RMR/MedQ  DD: 07/02/2008  DT: 07/03/2008  Job #: [id number]   cc:   Fleet Contras, M.D.

## 2011-03-22 NOTE — H&P (Signed)
NAMEBURDELL, PEED NO.:  [id number]   MEDICAL RECORD NO.:  [id number]          PATIENT TYPE:  INP   LOCATION:  3701                         FACILITY:  [facility]   PHYSICIAN:  Joellyn Rued, PA-C     DATE OF BIRTH:  25-May-1956   DATE OF ADMISSION:  04/14/2008  DATE OF DISCHARGE:                              HISTORY & PHYSICAL   BRIEF HISTORY:  [name] is a 55 year old African American male who  was transported via [place] from the [facility] Emergency Room to [facility] for further evaluation of his chest discomfort.  He states since  Saturday, he has noticed an anterior sharp sticking chest discomfort  that starts in the left anterior chest and radiates to the right  anterior chest.  This primarily occurs at night.  He states at any time  he lies down, the discomfort begins and it is relieved with sitting up  on the side of the bed.  He denies any associated nausea, vomiting,  diaphoresis, or shortness of breath.  There is no change with exertion,  movement, and burping.  He also states when he lies down he feels like  he is choking, and gives it an 11 on a scale of 0-10.  He has not tried  to take any medications to try to improve this discomfort.  He denies  prior occurrences.  Last night, he states that it was so bad that he did  not get any sleep.  He also states it is not pleuritic nor did it change  with upper body movement.  Currently it is a 0 on a scale of 0-10.   PAST MEDICAL HISTORY:  No known drug allergies.  Medications pior to  admission include, Diovan 160 mg daily, glimepiride 4 mg daily, Plavix  75 mg daily, and unknown inhaler p.r.n.  He has a history of diabetes  for which he does check his sugar every morning; history of  hypertension, that he does not check his blood pressure; hyperlipidemia,  unknown last checked; obesity; he also describes a history of a CVA  where he lost consciousness and has a residual left numbness and he is  not  clear where and when he was hospitalized for this.  He denies any  surgeries.  He specifically denies any history of myocardial infarction,  COPD, bleeding dyscrasias, renal dysfunction, and thyroid disorder.   SOCIAL HISTORY:  He resides in [place] alone in a trailer.  He is  single without children.  He is on disability from being a Curator.  He  smokes approximately 1 pack every 2 days and he has been doing so for 15  years.  He drinks beer daily and liquor 1 time a week.  He states he has  not smoked pot in over 15 years.  Denies herbal medications, specific  diet, or exercise program.   FAMILY HISTORY:  His mother is alive at the age of 19, alive and well.  His father died at age of 76, possible myocardial infarction with a  history of diabetes and hypertension.  He has 3 brothers  and 1 sister  who he states are all in good health.   REVIEW OF SYSTEMS:  In addition to the above, is notable for chronic  sinus congestion, reading glasses, poor dentition, chronic dyspnea on  exertion that has remained unchanged, orthopnea, chronic cough,  wheezing, chronic lower extremity edema, positive snoring, he has never  been evaluated for obstructive sleep apnea, urinary frequency and  nocturia, left-sided weakness and numbness.  All other systems were  unremarkable.   PHYSICAL EXAMINATION:  GENERAL:  Well nourished, well developed, obese  African American male in no acute distress.  VITAL SIGNS:  Temperature is 98.3, blood pressure is 125/66, pulse 89  and regular, respirations 18, 100% sat on 2 L, and Glucometer here at  [facility] is 111.  HEENT:  Unremarkable, except for poor dentition.  NECK:  Supple without thyromegaly, adenopathy, JVD, or carotid bruits.  CHEST:  Symmetrical excursion.  LUNGS:  Sounds were diminished, but clear to auscultation without rales,  rhonchi, or wheezing.  HEART:  PMI is not displaced.  Regular rate and rhythm.  I do appreciate  1/6 systolic ejection murmur,  best appreciated at left sternal border.  All pulses are symmetrical and intact.  I did not appreciate any  abdominal bruits.  SKIN:  Skin integument appears to be intact.  He has what appears to be  possible venous stasis changes in his lower extremities.  ABDOMEN:  Obese.  Bowel sounds present without organomegaly, masses, or  tenderness.  EXTREMITIES:  Negative cyanosis or clubbing.  He has trace edema.  MUSCULOSKELETAL:  Unremarkable.  NEURO:  Unremarkable.   LABORATORY DATA:  Chest x-ray at [facility] showed cardiomegaly, no  active disease.  I think his initial EKG was hooked up incorrectly in  regards to the limb leads.  However, second EKG at 11 o'clock showed  normal sinus rhythm, left axis deviation, early R-wave, and nonspecific  ST-T wave changes.  H&H is 12.8 and 36.5, platelets 223, WBC is 5.4,  sodium 133, potassium 5.0, BUN 29, creatinine 1.40, glucose 140, BNP  171, and D-dimer 0.39.  Point of care markers were unremarkable x3;  however, second point of care marker did show a troponin of 0.19.   IMPRESSION:  1. Atypical chest discomfort of uncertain etiology.  2. Hypertension.  3. Tobacco use.  4. Obesity.  5. Alcohol use.  6. Renal insufficiency.  7. Hyperglycemia with history of diabetes.  8. Abnormal EKG with an early R-wave, possibly indicative of old      posterior myocardial infarction, history as noted per past medical      history.   DISPOSITION:  Dr. Daleen Squibb reviewed the patient's history, spoke with, and  examined the patient and agrees with the above.  Given his abnormal EKG,  we will check an echocardiogram.  If he has wall motion abnormalities,  he will need a cardiac catheterization.  If he rules out for myocardial  infarction and his echocardiogram is unremarkable for wall motion  abnormalities, we will arrange an adenosine Myoview for further  evaluation.  I have asked for the echocardiogram to be done first thing  in the morning with early review  and tentatively scheduled him for an  adenosine Myoview.  We will continue him on his home medications.  In  addition, I have also started a beta blocker, statin, and Protonix.  We  will check his usual labs.      Joellyn Rued, PA-C     EW/MEDQ  D:  04/14/2008  T:  04/15/2008  Job:  [id number]   cc:   Fleet Contras, M.D.
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
## 2011-03-22 NOTE — Op Note (Signed)
NAMEMITHRAN, STRIKE NO.:  [id number]   MEDICAL RECORD NO.:  [id number]          PATIENT TYPE:  INP   LOCATION:  2310                         FACILITY:  [facility]   PHYSICIAN:  Burna Forts, M.D.DATE OF BIRTH:  [birth year]   DATE OF PROCEDURE:  04/17/2008  DATE OF DISCHARGE:                               OPERATIVE REPORT   INDICATIONS FOR PROCEDURE:  Ms. [name] is a 55 year old  gentleman who presents today for coronary artery bypass grafting.  There  is some suspicion he might have a patent foramen ovale.  We have been  asked to place a TEE probe to evaluate cardiac structures and function  with particular attention to look for the PFO.  He was brought to the OR  on the morning of surgery where under local anesthesia with sedation,  radial arterial and pulmonary lines were placed.  Taken to the OR for  routine induction of general anesthesia.  Probe was then lubricated,  protected and passed oropharyngeally into the stomach and then slightly  withdrawn for imaging of the cardiac structures.   PRE-CARDIOPULMONARY BYPASS EXAMINATION:  Left ventricle.  Medially seen  is the short axis view of the left ventricular chamber.  There was left  ventricular hypertrophy noted concentrically in this short axis view.  There is mildly diminished left ventricular function noted in the short  axis view with normal anterior, anterolateral and anteroseptal wall  contractility and function.  There is some mildly diminished posterior  and inferior wall function noted would be considered mildly hypokinetic.  Long axis view in the left ventricular chamber again reveals mild  decrease contractility in the posterior-inferior aspect.  No masses are  noted within and papillary muscles are well outlined.   Mitral valve.  There  is a thickened mitral valve apparatus appreciated.  Both leaflets are somewhat thickened but appeared to open appropriately  during diastolic filling and  cut appropriately just below the level of  the annulus diastolic injection.  Pulse wave Doppler is used to assess  mitral inflow which reveals a very sharply elevated E versus A wave  about two times E versus A.  There is noted decreased deceleration at  this time.  This is indicative of somewhat almost of a restricted  physiology of the left ventricle often associated with higher left  atrial pressures.  Color examination further reveals 1+ mitral  regurgitant flow centrally located.   Left atrium.  The articular chamber itself is mildly enlarged .  Pulmonary veins are not well visualized but we could not demonstrate any  reversal flow there.  The interatrial septum is interrogated carefully  and it reveals a very small in the mid low septal area a very small PFO  with a left-to-right flow.  It is not a turbulent flow.  It is a smooth  and very small PFO appreciated.   Aortic valve.  Normal, thin, trileaflet aortic valve.   Right ventricle.  Right ventricular chamber is somewhat enlarged in  size, contractility appears normal.   Tricuspid valve is visualized.  There is 1 to 1-1/2+ tricuspid  regurgitant  flow noted across the tricuspid valve.  Right atrial chamber  is of normal size and function.  Pulmonary artery catheter is noted  within.   The patient is placed on cardiopulmonary bypass.  This goes on for a  fairly significant period of time.  Due to some technical difficulties  however, coronary artery bypass grafting is carried out.  That is the  only operative procedure there performed  by Dr. Kathlee Nations Tright.  The  patient then rewarmed and separated from cardiopulmonary bypass with the  initial attempt.   </   POST CARDIOPULMONARY BYPASS EXAMINATION (LIMITED EXAM):  Left ventricle.  In the early bypass period, both short and long axis views of the left  ventricular chamber revealed mild-to-moderate diminished LV chamber  function in this early bypass.  However, with  time from separation of  cardiopulmonary bypass and with the addition of the inotrope, this left  ventricular chamber function was significantly improved with good  overall contractility appreciated both in the short axis and long axis  views.  The ejection fraction appeared normal about 40-45% easily.   Mitral valve again indeed interrogated and there is much diminished the  1+ mitral regurgitant flow that had been appreciated was now gone.  The  analysis of the tricuspid valve as well showed much diminished tricuspid  regurgitation now for almost about 1-1/2+ to almost trace to trivial.  The rest of the cardiac examination was as previously described without  any significant changes and the patient was returned to the Cardiac  Intensive Care Unit in stable condition.           ______________________________  Burna Forts, M.D.     JTM/MEDQ  D:  04/18/2008  T:  04/18/2008  Job:  [id number]

## 2011-03-22 NOTE — Assessment & Plan Note (Signed)
OFFICE VISIT   NEITHAN, DAY  DOB:  1956-03-24                                        May 30, 2008  CHART #:  [id number]   The patient is status post coronary artery bypass grafting x4 done by  Dr. Donata Clay on April 17, 2008.  He also had coronary endarterectomy of  the left anterior descending artery at this time as well.  Postoperatively, the patient did have some blood loss anemia, which he  required transfusions for.  He also developed acute tubular necrosis and  was placed on a renal dopamine drip and creatinine monitor.  Creatinine  at the time of discharge was 1.07 with a BUN of 30.  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  The patient's vision had improved prior to discharge.  He presents today for his followup office visit.  The patient states  that he had to be re-admitted to the hospital due to increasing  shortness of breath.  He was re-admitted under Dr. Daleen Squibb with acute on  chronic diastolic congestive heart failure.  The patient received  diuretics and improved and was discharged to home.  Since then, he has  seen Dr. Daleen Squibb in the office.  He has a followup appointment with him  this coming Monday, June 02, 2008.  The patient does plan to do cardiac  rehab when contacted.  He states that they should be contacting him  within the next week.  The patient is progressing well.  He is  ambulating in and around the house.  He says that his breathing has  improved.  He does complain of decreased urination.  He says he go about  2 times in a day.  He denies any urgency, frequency, dysuria, or  hematuria.  The patient denies any opening or drainage from any of his  incision sites,  nausea and vomiting, or significant incisional pain.  He  is off all pain medications.  The patient states that he has not smoked  or drank since surgery.   PHYSICAL EXAMINATION:  VITAL SIGNS:  Blood pressure 115/67, pulse is  98%, respirations of 18, and O2 sat 92% on room air.  RESPIRATORY:  Clear to auscultation bilaterally.  CARDIAC:  Regular rate and rhythm.  S1 and S2 noted.  ABDOMEN:  Bowel sounds x4.  Soft and nontender.  EXTREMITIES:  Positive peripheral edema, right greater than left.  Extremities are warm and well perfused.  Incisions are clean, dry, and  intact and healing well.  One stitch was removed from the upper sternum.  Neosporin placed.   STUDIES:  The patient had a PA and lateral chest x-ray done today, which  is clear showing no pneumonia, atelectasis, or pleural effusions.  Report from chest x-ray done on May 01, 2008, showed posterior aspects  of the left second and third rib fracture with an old fracture in the  posterior aspect of the left fifth rib.  There is an old fracture at  lateral aspect of the right sixth rib.  This is noted  on today's x-ray.   IMPRESSION AND PLAN:  The patient is status post coronary artery bypass  grafting x4 on April 17, 2008, by Dr. Donata Clay.  The patient was seen  and evaluated by Dr. Donata Clay.  Dr. Donata Clay did evaluate the  patient's PA and lateral chest x-ray.  The patient is progressing well.  He is encouraged to do cardiac rehab when contacted.  He is encouraged  to walk at least 10 minutes a day and increase in increments of 5  minutes.  The patient is to keep appointment with his cardiologist on  Monday, June 02, 2008.  He is to continue taking medications per his  cardiologist.  The patient was instructed the need of taking an aspirin  325 mg daily.  The patient is released to drive.  He is told do no heavy  lifting over 10 pounds for another month.  He is to continue watching  his incisions using soap and water.  We  will release the patient from  our office, but if he has any surgical issues, he is to contact us.  He  acknowledges understanding.  He is to continue to follow up with his  primary care physician for diabetes management.   Kerin Perna, M.D.  Electronically Signed   KMD/MEDQ  D:  05/30/2008  T:  05/31/2008  Job:  [id number]   cc:   Kerin Perna, M.D.  Thomas C. Wall, MD, [facility]

## 2011-03-22 NOTE — Letter (Signed)
December 22, 2008    Fleet Contras, MD  [address], [place] [address]   RE:  DEMON, VOLANTE  MRN:  [id number]  /  DOB:  1956-02-27   Dear Dr. Concepcion Elk:   Mr. Sherrod returns to the office for continued assessment and  treatment of coronary disease and cardiovascular risk factors, now  nearly 1 year following CABG surgery.  He continues to do well in  general.  He is exercising on a treadmill total of 20-40 minutes per day  without much difficulty.  He still notes dyspnea with some of his daily  activities.  He continues to refrain from cigarette smoking.  He has  never been asked to modify his diet, which does not sound ideal.  He has  had borderline hyperkalemia that has been stable over the past few  months as well as renal insufficiency exacerbated by diuretics.  He  notes some intermittent edema in his right leg and continued numbness  since surgery.   Current medications are unchanged from his last visit except for  discontinuation of his potassium supplement and iron, and decrease in  furosemide to 40 mg daily.   PHYSICAL EXAMINATION:  GENERAL:  Overweight pleasant gentleman in no  acute distress.  VITAL SIGNS:  Weight is 312, 14 pounds more than in November and 40  pounds above his lowest weight, blood pressure 130/90, heart rate 72 and  regular, respirations 12 and unlabored.  NECK:  No jugular venous distention; no carotid bruits.  LUNGS:  Clear.  Thorax:  Stable sternum; fairly prominent scar tissue  over his mid substernal incision; old scar on the right back.  CARDIAC:  Normal first and second heart sounds.  ABDOMEN:  Soft and nontender; no bruits.  EXTREMITIES:  1/2+ right ankle edema.   Recent laboratory is generally good.  Chest x-ray in November showed  minor chronic bronchitic changes.  Most recent chemistry profile in  December shows potassium of 5.2, glucose of 122 and BUN of 40 with a  creatinine of 1.4.  CBC in November was essentially  normal.  Lipid  profile was excellent with total cholesterol of 144, triglycerides of  229, HDL 31, and LDL of 67.  BNP level was 164.   IMPRESSION:  Mr. Wingard is doing well in general.  I doubt that his  exertional dyspnea is related to his heart disease.  He will continue to  gradually increase exercise time on his treadmill and attempt to lose  weight.  We will provide him with information concerning an appropriate  diabetic diet.  He is congratulated on continuing to refrain from  cigarette smoking.  Renal function has stabilized with a reduction in  his furosemide dose.  He had a recent sleep study ordered by Dr. Jetty Duhamel demonstrating significant sleep apnea.  A return visit for  treatment is planned.  I will recheck a chemistry profile in 4 months  and see this nice gentleman again in 8 months.   Thanks for allowing me to share in his care.    Sincerely,      Gerrit Friends. Dietrich Pates, MD, [facility]  Electronically Signed    RMR/MedQ  DD: 12/22/2008  DT: 12/23/2008  Job #: [id number]

## 2011-03-22 NOTE — Procedures (Signed)
[name], [name]              ACCOUNT NO.:  [id number]   MEDICAL RECORD NO.:  [id number]          PATIENT TYPE:  OUT   LOCATION:  SLEEP CENTER                 FACILITY:  [facility]   PHYSICIAN:  Clinton D. Maple Hudson, MD, FCCP, FACPDATE OF BIRTH:  21-Nov-1957   DATE OF STUDY:  11/16/2008                            NOCTURNAL POLYSOMNOGRAM   REFERRING PHYSICIAN:  Fleet Contras, MD   INDICATION FOR STUDY:  Hypersomnia with sleep apnea.   EPWORTH SLEEPINESS SCORE:  Epworth Sleepiness Score 1/24.  BMI 32.8.  Weight 235 pounds.  Height 71 inches.  Neck 18.5 inches.   MEDICATIONS:  Home medications are charted and reviewed.   SLEEP ARCHITECTURE:  Total sleep time 344 minutes with sleep efficiency  81.3%.  Stage I was 8.1%.  Stage II 77.4%.  Stage III 9.4%.  REM 5.1% of  total sleep time.  Sleep latency 28.5 minutes.  REM latency 283 minutes.  Awake after sleep onset 50.5 minutes.  Arousal index 4.0.  No bedtime  medication taken.   RESPIRATORY DATA:  Diagnostic NPSG study ordered.  Apnea-hypopnea index  (AHI) 80.1 per hour.  A total of 460 events were scored including 24  obstructive apneas and 436 hypopneas.  Events were not positional.  REM  AHI 82.3 per hour.  CPAP titration was not attempted because diagnostic  NPSG protocol was ordered.   OXYGEN DATA:  Moderately loud snoring with oxygen desaturation to a  nadir of 71%.  Mean oxygen saturation through the study was 91.4% on  room air.   CARDIAC DATA:  Sinus rhythm with PVCs.   MOVEMENT-PARASOMNIA:  No significant movement disturbance.  Bathroom x1.   IMPRESSIONS-RECOMMENDATIONS:  1. Sleep architecture fragmented by frequent brief wakings consistent      with respiratory disturbance, reduced percentage time in rapid eye      movement.  2. Severe obstructive sleep apnea/hypopnea syndrome, AHI 80.1 per hour      with loud snoring, nonpositional events and oxygen desaturation to      a nadir of 71% on room air.  A total of 40.1  minutes was recorded      with oxygen saturation less than 88% on room air.  3. Consider return for CPAP titration or evaluate for alternative      therapies as appropriate.  Formal retitration will      allow confirmation that CPAP corrects oxygen desaturation during      sleep.  Otherwise, consider overnight oximetry for this as      appropriate.      Clinton D. Maple Hudson, MD, [facility], FACP  Diplomate, Biomedical engineer of Sleep Medicine  Electronically Signed     CDY/MEDQ  D:  11/22/2008 10:06:14  T:  11/22/2008 22:09:58  Job:  [id number]
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
## 2011-03-22 NOTE — Letter (Signed)
September 22, 2008    Fleet Contras, MD  [address], [place] [address]   RE:  [name], [name]  MRN:  [id number]  /  DOB:  [birth year]   Dear Dr. Concepcion Elk:   [name] returns to the office for continued assessment and  treatment of coronary disease, now 5 months following CABG surgery.  Unfortunately, he was unable to complete the cardiac rehabilitation  program due to difficulties with transportation.  He has remained at  home, inactive and has gained a significant amount of weight.  He  reports dyspnea with mild exertion.  He has had no chest discomfort.  He  has chronic pedal edema that is unchanged.  He was last seen by you  approximately a month ago.  The good news is that he has discontinued  cigarette smoking.   Current medications are uncertain.  He was asked to bring in his  bottles, but he failed to do so.  He cannot describe his current regimen  from memory.   PHYSICAL EXAMINATION:  GENERAL:  Pleasant overweight gentleman in no  acute distress.  VITAL SIGNS:  The weight is 298, 21 pounds more than in August.  Blood  pressure 110/80, heart rate 72 and regular, respirations 16, O2  saturation 93% on room air.  NECK:  No jugular venous distention; no carotid bruits.  LUNGS:  Clear.  CARDIAC:  Normal first and second heart sounds.  Fourth heart sound  present.  ABDOMEN:  Soft and nontender; no organomegaly.  EXTREMITIES:  Pretibial edema 1+.   IMPRESSION:  [name] continues to do fairly well following coronary  artery bypass graft surgery.  It is likely that his exertional dyspnea  is related to inactivity and excessive weight.  We  will check a chest x-ray and a BNP level.  Other basic labs including  CBC, chemistry profile, and lipid profile will be obtained.  If results  are good, I will see this nice gentleman in 3 months.  We are attempting  to obtain records from your office and from the pharmacy to determine  his medical regime.    Sincerely,      Gabriel Friends. Dietrich Pates, MD, [facility]  Electronically Signed    RMR/MedQ  DD: 09/22/2008  DT: 09/23/2008  Job #: [id number]

## 2011-03-22 NOTE — Consult Note (Signed)
[name], SEAVEY NO.:  [id number]   MEDICAL RECORD NO.:  [id number]          PATIENT TYPE:  INP   LOCATION:  2310                         FACILITY:  [facility]   PHYSICIAN:  Melvyn Novas, M.D.  DATE OF BIRTH:  1956-10-26   DATE OF CONSULTATION:  DATE OF DISCHARGE:                                 CONSULTATION   HISTORY:  The patient was transferred from an outside hospital,  evaluation for chest pain.  Plan to undergo open heart surgery at that time.  Today on April 22, 2008, Dr. Dennie Maizes OR nurse called in this consult  for a 55 year old African American male named above, looking much older  than his numerical age.  He presents with new onset of blurred vision  and left-sided visual field loss.  Onset was according to the patient,  around 3 a.m. when he watched T.V. because he could not sleep.  He had noticed none of the vision impairment at 3 and at [facility],  when  again tried to watch T.V. he had blurred vision on complaint.    Now at 10 a.m., the patient states that he has had many times vision  changes before, but it is not very forthcoming nor organized with any  past medical history, visual history, and actual states that he does not  have an ophthalmologist, he regularly follows.  T  he patient has a history of an previous CVA and states that he has  residual left-sided numbness, he was at one time week on the left but  this has resolved he states.  The patient denies any pain, weakness, no  sensory loss, no motor loss, and states that his vision has just been  off as if there is a foreign object in his eye.  He has no diplopia, no fever, no range of motion deficits, no neck  rigidity, and associated tinnitus, swallowing, or speech difficulties.   PAST MEDICAL HISTORY:  The patient was admitted on transfer on April 14, 2008, for chest pain.  A cardiac catheterization showed multi-vessel coronary artery disease.  CABG followed on April 17, 2008.  He has  past medical history of  diabetes, hypertension, obesity, obstructive sleep apnea, CVA, and  previously a CABG x4.   SOCIAL HISTORY:  The patient lives at home, states he is independend.  He has family here in [place].   FAMILY HISTORY:  Diabetes mellitus, hypertension, and coronary artery  disease.   MEDICATIONS:  Plavix, glimepiride, and Diovan were given at home.   ALLERGIES:  He has no known medication allergies listed.   The patient had an unenhanced CT of the skull that showed no  abnormalities.   His labs were 137 for sodium, 4 for potassium, glucose in the morning  was 79.  INR is 1.4 and PT is 17.5.  H and H is remarkably low at 7.7  over 22.5.   Vital signs were 120/74 of blood pressure, heart rate of 86, which is  probably explained with the anemia, O2 of 100% saturation, does nasal  cannula on 2 liters, and respiratory rate that is still  elevated at 18  to 20.  The patient is not on any respiratory-assisted device that I can  see.  The patient is alert and oriented, but a poor historian.  He has  difficulty following simple-step commands through the neurologic exam  especially of his visual fields.  There is no evidence of any slurring  of speech.  There is no evidence of any discomfort or pain.  Cranial  nerve examination shows left-sided ptosis, left monocular upper left  quadrant loss by finger perimetry.  The full lower facial movements do not suggest any cranial nerve 7  lesion.  He has tongue and uvula in midline.  Full neck range of motion.  He can close both eyes but with unequal strength.  Motor examination  shows no drift in the left or right arm.  Finger-to-nose test was  performed, bilaterally equally, he can rotate and rapidly alternate  movements.  He had a drift in his right leg, but states that this hurts  because of his catheterization.  On sensory, left-sided facial, truncal,  and leg numbness in comparison to the right.  This is also confirmed   with simultaneous bilateral stimulation.  Coordination of the lower  extremities by heel-to-shin and by gait exam had to be deferred.   ASSESSMENT:  This is not likely a stroke-related deficit.  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  The patient has no central findings that are new in origin.    Monocular quadrant vision loss is only seen with antichiasmatic  lesions or incomplete recovery from an eye surgery.  Given the  associated ptosis I do think that the patient must have had eye surgery  in the past, also he is unable to give [place] a past medical history in  detail.      Melvyn Novas, M.D.  Electronically Signed     CD/MEDQ  D:  04/22/2008  T:  04/23/2008  Job:  [id number]

## 2011-03-22 NOTE — Discharge Summary (Signed)
NAMEDORSE, LOCY NO.:  [id number]   MEDICAL RECORD NO.:  [id number]           PATIENT TYPE:   LOCATION:                                 FACILITY:   PHYSICIAN:  Kerin Perna, M.D.  DATE OF BIRTH:  20-Aug-1956   DATE OF ADMISSION:  04/14/2008  DATE OF DISCHARGE:  04/26/2008                               DISCHARGE SUMMARY   ADMITTING DIAGNOSES:  1. Multivessel coronary artery disease (include a 95% left main      disease).  2. History of cerebrovascular accident.  3. History of diabetes mellitus.  4. History of hypertension.  5. History of renal insufficiency.  6. History of ethanol abuse.   DISCHARGE DIAGNOSES:  1. Multivessel coronary artery disease (include a 95% left main      disease).  2. History of cerebrovascular accident.  3. History of diabetes mellitus.  4. History of hypertension.  5. History of renal insufficiency.  6. History of ethanol abuse.  7. Postoperative blood loss anemia.  8. Postoperative acute tubular necrosis.   PROCEDURES:  1. Cardiac catheterization performed by Dr. Gala Romney on April 16, 2008.  2. Aortocoronary bypass x4 (LIMA to LAD, SVG to ramus intermediate,      SVG to circumflex marginal, and SVG to right coronary artery with      left anterior descending coronary endarterectomy).  3. EVH of right lower extremity with placement of drain and placement      of left subclavian triple-lumen catheter by Dr. Donata Clay on April 17, 2008.   HOSPITAL COURSE:  The patient is a 55 year old African American male  with a history of hypertension, diabetes mellitus, hyperlipidemia, and  CVA in 2007 who presented to [facility] with chest  discomfort.  The patient was transferred to [facility] for further  evaluation and treatment.  According to medical records, the patient was  placed on Plavix following his stroke in 2007.  At that time, a TEE was  performed that demonstrated a patent foramen ovale which  was never  closed.  The patient presented to [facility] with symptoms of substernal  chest discomfort for the last 5 days.  The pain occurred at rest and was  associated with shortness of breath and a choking sensation in his  neck.  EKG showed early R-wave suggestive of possible myocardial  infarction.  The patient did have elevated troponin levels that ranged  from 0.09-0.19.  The patient underwent cardiac catheterization on April 16, 2008, and was found to have critical left main stenosis of 95% with  subportal stenosis of the right coronary artery and severe disease of  the circumflex.  His EF was estimated at 45%.  It should be noted that  as previously stated the patient had a history of CVA and was on Plavix  prior to his admission.  Because of significant stenosis of the left  main, the patient underwent the aforementioned aortocoronary bypass CABG  surgery with delay.  As previously stated, there is a question of patent  foramen ovale on previous echo done in 2007.  A TEE done  intraoperatively showed the patent foramen ovale to be very small and 2-  3 mm at most, and therefore this was not closed.   Postoperatively, the patient had postoperative anemia.  He was given 1  unit packed red blood cells on postoperative day #1.  He also had ATN  postoperatively.  Renal dopamine was continued as well as a Lasix drip.  His creatinine was monitored closely.  The patient had some abdominal  distention postoperatively.  KUB was obtained on April 20, 2008, which  showed moderate colonic distention, no free air, which was consistent  with a colonic ileus.  The patient remained n.p.o.  He did have a bowel  movement on April 21, 2008, and April 22, 2008.  Liquids were started and  diet was increased as tolerated.  In addition, his creatinine continued  to decrease.  He continued to be anemic postoperatively (7.7 and 22.5).  On April 22, 2008, he was given other unit of packed red blood cells.   Also on this date, the patient had complaints of new-onset blurry vision  and left-sided visual field loss.  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  The patient's vision improved over the next couple of days.  He  continued to work with cardiac rehab.  Currently on postop day #8, the  patient had T-max of 100.5 and later became afebrile.  Vital signs were  stable.  He is still on 2 L nasal cannula oxygen at 97%.  His preop  weight was 132, today's weight is 134.6.  H&H are 8.2 and 23.9.  BUN and  creatinine are 30 and 1.07 respectively.   On physical exam, cardiovascular, regular rate and rhythm.  Pulmonary,  clear to auscultation bilaterally.  Abdomen is soft.  Bowel sounds are  present.  Extremities, right greater than left lower extremity edema.   The patient is going to be given Lasix for volume overload.  Creatinine  will be monitored.  He will be weaned off his oxygen as tolerated.  Continue to use incentive spirometry.  His fever today is likely  secondary to atelectasis, but the patient remains hemodynamically stable  and is weaned off his oxygen.  He will most likely be discharged on April 26, 2008.  Last chest x-ray done on April 24, 2008, which showed  cardiomegaly and bilateral effusions with mild bibasilar atelectasis.  Last CBC done on April 25, 2008, H&H are 8.2 and 22.1 respectively, white  count 5500, platelet count 221,000.  BMET last done today as well;  potassium 4.3, BUN and creatinine 30 and 1.07 respectively.  Labs will  be checked in the morning prior to discharge.   DISCHARGE INSTRUCTIONS:  No driving or lifting more than 10 pounds and  was instructed to do so.  Continue with breathing exercise daily.  Walk  everyday and increase frequency and duration as tolerated.  He is to  remain in a carbohydrate-modified medium caloric  diet.  He has an  appointment to see Dr. Eden Emms of [facility] on May 07, 2008.  In  addition, an appointment was made for the patient to seen Dr. Donata Clay  in 3 weeks.  Prior to this point, a chest x-ray will be obtained.   DISCHARGE MEDICATIONS:  1. EC asa 325 mg  p.o. daily.  2. Lopressor 12.5 mg p.o. twice daily.  3. Zocor 20 mg p.o. nightly.  4. Advair Discus inhaler 250/50 inhale 1 puff 2 times daily.  5. Oxycodone 5 mg 1-2 tablets p.o. q.4-6 hours as needed for pain.  6. Glimepiride 4 mg p.o. daily.   It will be determined on the day of discharge whether or not the patient  has to continue with several more days of Lasix or potassium.      Doree Fudge, [place]      Kerin Perna, M.D.  Electronically Signed    DZ/MEDQ  D:  04/25/2008  T:  04/26/2008  Job:  [id number]

## 2011-04-21 ENCOUNTER — Ambulatory Visit: Payer: Self-pay | Admitting: Adult Health

## 2011-04-29 ENCOUNTER — Encounter: Payer: Self-pay | Admitting: Adult Health

## 2011-04-29 ENCOUNTER — Ambulatory Visit (INDEPENDENT_AMBULATORY_CARE_PROVIDER_SITE_OTHER): Payer: Medicaid Other | Admitting: Adult Health

## 2011-04-29 DIAGNOSIS — I2510 Atherosclerotic heart disease of native coronary artery without angina pectoris: Secondary | ICD-10-CM

## 2011-04-29 DIAGNOSIS — E663 Overweight: Secondary | ICD-10-CM

## 2011-04-29 DIAGNOSIS — I10 Essential (primary) hypertension: Secondary | ICD-10-CM

## 2011-04-29 NOTE — Assessment & Plan Note (Signed)
He says his blood pressure at home is lower than our readings.  I have rechecked his BP in the exam room manuel.  It is equal to the initial blood pressure readings.  I have asked him to bring in his BP cuff from home to calibrate it with ours.  If he keeps a BP < 100 systolic, will need to decrease medications to avoid hypotension.

## 2011-04-29 NOTE — Assessment & Plan Note (Signed)
He is doing well from cardiac standpoint. He has lost 65 lbs and has become more active.  He is without complaint. He is due to see his PCP in 2 weeks for blood work.  Will have copies of this sent to [place] for review as well.  He will see [place] in 6 months unless symptomatic.

## 2011-04-29 NOTE — Assessment & Plan Note (Signed)
I have encouraged him to continue on his weight loss regimen. He has done very well with this.

## 2011-04-29 NOTE — Progress Notes (Signed)
HPI: [name] is a morbidly obese patient of Dr. Dietrich Pates we are following for ongoing assessment and treatment of CAD, and multiple CVRF. He is here for annual follow-up.  He has lost 65 lbs since last seen and continues to be active. He states he is feeling much better without recurrent chest pain.  He is managing his diabetes well and has actually not had to take metformin as his BG is running in the 70's in the am.  He is due to see his PCP in two weeks. He is more active. He is without complaint.  No Known Allergies  Current Outpatient Prescriptions  Medication Sig Dispense Refill  . aspirin 81 MG tablet Take 81 mg by mouth daily.        Marland Kitchen glimepiride (AMARYL) 4 MG tablet Take 4 mg by mouth daily before breakfast.        . lisinopril-hydrochlorothiazide (PRINZIDE,ZESTORETIC) 20-12.5 MG per tablet Take 1 tablet by mouth daily.        . metFORMIN (GLUMETZA) 1000 MG (MOD) 24 hr tablet Take 1,000 mg by mouth 2 (two) times daily.        . metoprolol (TOPROL-XL) 50 MG 24 hr tablet Take 50 mg by mouth daily. Take 1 1/2 tab daily         Past Medical History  Diagnosis Date  . Coronary artery disease   . Hypertension   . Hyperlipidemia   . OSA on CPAP   . Obesity   . Diabetes mellitus   . Diastolic heart failure     History reviewed. No pertinent past surgical history.  AOZ:HYQMVH of systems complete and found to be negative unless listed above PHYSICAL EXAM BP 129/80  Pulse 72  Ht 6\' 1"  (1.854 m)  Wt 265 lb (120.203 kg)  BMI 34.96 kg/m2  SpO2 96% General: Well developed, well nourished, obese in no acute distress Head: Eyes PERRLA, No xanthomas.   Normal cephalic and atramatic  Lungs: Clear bilaterally to auscultation and percussion. Heart: HRRR S1 S2, .  Pulses are 2+ & equal.            No carotid bruit. No JVD.  No abdominal bruits. No femoral bruits. Abdomen: Bowel sounds are positive, abdomen soft and non-tender without masses or                  Hernia's noted. Msk:   Back normal, normal gait. Normal strength and tone for age. Extremities: No clubbing, cyanosis or edema.  DP +1 Neuro: Alert and oriented X 3. Psych:  Good affect, responds appropriately  EKG: NSR, rate of 68 bpm. RBBB with evidence of infero/lateral infarct age undetermined.  ASSESSMENT AND PLAN

## 2011-04-29 NOTE — Patient Instructions (Signed)
**Note De-identified Cinch Ormond Obfuscation** Your physician recommends that you continue on your current medications as directed. Please refer to the Current Medication list given to you today.  Your physician recommends that you schedule a follow-up appointment in: 6 months  

## 2011-05-06 ENCOUNTER — Encounter: Payer: Self-pay | Admitting: Adult Health

## 2011-06-30 ENCOUNTER — Other Ambulatory Visit: Payer: Self-pay | Admitting: Cardiology

## 2011-07-12 ENCOUNTER — Other Ambulatory Visit: Payer: Self-pay

## 2011-07-12 ENCOUNTER — Emergency Department (HOSPITAL_COMMUNITY): Payer: Medicaid Other

## 2011-07-12 ENCOUNTER — Inpatient Hospital Stay (HOSPITAL_COMMUNITY): Payer: Medicaid Other

## 2011-07-12 ENCOUNTER — Inpatient Hospital Stay (HOSPITAL_COMMUNITY)
Admission: EM | Admit: 2011-07-12 | Discharge: 2011-07-13 | DRG: 069 | Disposition: A | Discharge status: Home / Self Care | Payer: Medicaid Other | Attending: Internal Medicine | Admitting: Internal Medicine

## 2011-07-12 ENCOUNTER — Encounter (HOSPITAL_COMMUNITY): Payer: Self-pay

## 2011-07-12 DIAGNOSIS — I5032 Chronic diastolic (congestive) heart failure: Secondary | ICD-10-CM | POA: Diagnosis present

## 2011-07-12 DIAGNOSIS — E119 Type 2 diabetes mellitus without complications: Secondary | ICD-10-CM | POA: Diagnosis present

## 2011-07-12 DIAGNOSIS — G459 Transient cerebral ischemic attack, unspecified: Secondary | ICD-10-CM | POA: Diagnosis present

## 2011-07-12 DIAGNOSIS — Z720 Tobacco use: Secondary | ICD-10-CM | POA: Diagnosis present

## 2011-07-12 DIAGNOSIS — R209 Unspecified disturbances of skin sensation: Secondary | ICD-10-CM

## 2011-07-12 DIAGNOSIS — I679 Cerebrovascular disease, unspecified: Secondary | ICD-10-CM | POA: Diagnosis present

## 2011-07-12 DIAGNOSIS — I69998 Other sequelae following unspecified cerebrovascular disease: Secondary | ICD-10-CM

## 2011-07-12 DIAGNOSIS — I639 Cerebral infarction, unspecified: Secondary | ICD-10-CM | POA: Diagnosis present

## 2011-07-12 DIAGNOSIS — F17200 Nicotine dependence, unspecified, uncomplicated: Secondary | ICD-10-CM | POA: Diagnosis present

## 2011-07-12 DIAGNOSIS — E875 Hyperkalemia: Secondary | ICD-10-CM | POA: Diagnosis present

## 2011-07-12 DIAGNOSIS — I509 Heart failure, unspecified: Secondary | ICD-10-CM | POA: Diagnosis present

## 2011-07-12 DIAGNOSIS — I517 Cardiomegaly: Secondary | ICD-10-CM

## 2011-07-12 DIAGNOSIS — E782 Mixed hyperlipidemia: Secondary | ICD-10-CM | POA: Diagnosis present

## 2011-07-12 DIAGNOSIS — G4733 Obstructive sleep apnea (adult) (pediatric): Secondary | ICD-10-CM | POA: Diagnosis present

## 2011-07-12 DIAGNOSIS — E11628 Type 2 diabetes mellitus with other skin complications: Secondary | ICD-10-CM | POA: Diagnosis present

## 2011-07-12 DIAGNOSIS — I2510 Atherosclerotic heart disease of native coronary artery without angina pectoris: Secondary | ICD-10-CM | POA: Diagnosis present

## 2011-07-12 DIAGNOSIS — I10 Essential (primary) hypertension: Secondary | ICD-10-CM | POA: Diagnosis present

## 2011-07-12 LAB — GLUCOSE, CAPILLARY
Glucose-Capillary: 129 mg/dL — ABNORMAL HIGH (ref 70–99)
Glucose-Capillary: 129 mg/dL — ABNORMAL HIGH (ref 70–99)
Glucose-Capillary: 215 mg/dL — ABNORMAL HIGH (ref 70–99)
Glucose-Capillary: 221 mg/dL — ABNORMAL HIGH (ref 70–99)

## 2011-07-12 LAB — COMPREHENSIVE METABOLIC PANEL
CO2: 24 mEq/L (ref 19–32)
Calcium: 9.4 mg/dL (ref 8.4–10.5)
Creatinine, Ser: 1.27 mg/dL (ref 0.50–1.35)
GFR calc Af Amer: >60 mL/min (ref >60)
GFR calc non Af Amer: 59 mL/min — ABNORMAL LOW (ref >60)
Glucose, Bld: 135 mg/dL — ABNORMAL HIGH (ref 70–99)
Total Protein: 6.7 g/dL (ref 6.0–8.3)

## 2011-07-12 LAB — DIFFERENTIAL
Basophils Absolute: 0 10*3/uL (ref 0.0–0.1)
Eosinophils Absolute: 0.1 10*3/uL (ref 0.0–0.7)
Eosinophils Relative: 1 % (ref 0–5)
Lymphocytes Relative: 48 % — ABNORMAL HIGH (ref 12–46)
Lymphs Abs: 2.9 10*3/uL (ref 0.7–4.0)
Monocytes Absolute: 0.6 10*3/uL (ref 0.1–1.0)

## 2011-07-12 LAB — POCT I-STAT, CHEM 8
BUN: 28 mg/dL — ABNORMAL HIGH (ref 6–23)
Chloride: 109 mEq/L (ref 96–112)
Sodium: 139 mEq/L (ref 135–145)

## 2011-07-12 LAB — CK TOTAL AND CKMB (NOT AT ARMC)
CK, MB: 2.1 ng/mL (ref 0.3–4.0)
Relative Index: INVALID (ref 0.0–2.5)

## 2011-07-12 LAB — CBC
HCT: 37.7 % — ABNORMAL LOW (ref 39.0–52.0)
MCH: 28.7 pg (ref 26.0–34.0)
MCV: 86.7 fL (ref 78.0–100.0)
RDW: 15.2 % (ref 11.5–15.5)
WBC: 6 10*3/uL (ref 4.0–10.5)

## 2011-07-12 LAB — LIPID PANEL
LDL Cholesterol: 64 mg/dL (ref 0–99)
VLDL: 17 mg/dL (ref 0–40)

## 2011-07-12 LAB — PROTIME-INR: Prothrombin Time: 13.8 seconds (ref 11.6–15.2)

## 2011-07-12 MED ORDER — ASPIRIN-DIPYRIDAMOLE ER 25-200 MG PO CP12
1.0000 | ORAL_CAPSULE | Freq: Two times a day (BID) | ORAL | Status: DC
  Administered 2011-07-12 – 2011-07-13 (×3): 1 via ORAL
  Filled 2011-07-12 (×7): qty 1

## 2011-07-12 MED ORDER — ACETAMINOPHEN 325 MG PO TABS: 650.0000 mg | ORAL_TABLET | ORAL | Status: DC | PRN

## 2011-07-12 MED ORDER — METOPROLOL SUCCINATE ER 25 MG PO TB24
75.0000 mg | ORAL_TABLET | Freq: Every day | ORAL | Status: DC
  Administered 2011-07-13: 75 mg via ORAL
  Filled 2011-07-12 (×2): qty 3

## 2011-07-12 MED ORDER — METFORMIN HCL 500 MG PO TABS
1000.0000 mg | ORAL_TABLET | Freq: Two times a day (BID) | ORAL | Status: DC
  Administered 2011-07-13: 1000 mg via ORAL
  Filled 2011-07-12 (×2): qty 1

## 2011-07-12 MED ORDER — HYDROCHLOROTHIAZIDE 12.5 MG PO CAPS
12.5000 mg | ORAL_CAPSULE | Freq: Every day | ORAL | Status: DC
  Administered 2011-07-13: 12.5 mg via ORAL
  Filled 2011-07-12 (×2): qty 1

## 2011-07-12 MED ORDER — SIMVASTATIN 20 MG PO TABS
40.0000 mg | ORAL_TABLET | Freq: Every day | ORAL | Status: DC
  Administered 2011-07-12: 40 mg via ORAL
  Filled 2011-07-12: qty 2

## 2011-07-12 MED ORDER — NICOTINE 21 MG/24HR TD PT24
21.0000 mg | MEDICATED_PATCH | Freq: Every day | TRANSDERMAL | Status: DC
  Administered 2011-07-12 – 2011-07-13 (×3): 21 mg via TRANSDERMAL
  Filled 2011-07-12 (×3): qty 1

## 2011-07-12 MED ORDER — ENOXAPARIN SODIUM 40 MG/0.4ML ~~LOC~~ SOLN
40.0000 mg | SUBCUTANEOUS | Status: DC
  Administered 2011-07-12: 40 mg via SUBCUTANEOUS
  Filled 2011-07-12: qty 0.4

## 2011-07-12 MED ORDER — ASPIRIN 81 MG PO CHEW
324.0000 mg | CHEWABLE_TABLET | Freq: Once | ORAL | Status: AC
  Administered 2011-07-12: 324 mg via ORAL
  Filled 2011-07-12: qty 4

## 2011-07-12 MED ORDER — HYDRALAZINE HCL 20 MG/ML IJ SOLN: 10.0000 mg | Freq: Four times a day (QID) | INTRAMUSCULAR | Status: DC | PRN

## 2011-07-12 NOTE — ED Notes (Signed)
Woke from sleep, got up walking to kitchen, left leg and left arm were numb and could not hold them  Up, better now.

## 2011-07-12 NOTE — Progress Notes (Signed)
*  PRELIMINARY RESULTS* Echocardiogram 2D Echocardiogram has been performed.  [name] 07/12/2011, 4:29 PM

## 2011-07-12 NOTE — ED Notes (Signed)
Called to floor for report. RN with pt at present, to call back when ready for report.

## 2011-07-12 NOTE — ED Provider Notes (Signed)
History     CSN: [id number] Arrival date & time: 07/12/2011  5:54 AM  Chief Complaint  Patient presents with  . Numbness   HPI Comments: Patient is a very pleasant 55 year old male who presents with weakness of his left arm and left leg. He states that he went to bed last night feeling at his normal baseline and awoke this morning finding that he could not walk or use his left hand well. He states that trying to lift his arm off the bed it would collapse back down and his left leg would not work to help him walk.  His right arm and leg he seemed to work in a normal fashion. He denies any changes in his vision or speech. The symptoms are constant but gradually improving. He denies any fever, chills, vomiting, nausea, abdominal pain, chest pain, palpitations, headache, rash. He does have a history of cardiovascular disease and has undergone coronary artery bypass grafting in the last several years. His diabetes has been fairly well controlled his blood sugars have ranged in the 70s to 100s. On EMS arrival his blood sugar was 66. He was given orange juice and it improved to 70 and now to 100 on arrival.  He has never had a transient ischemic attack or stroke in the past.  He has followed regularly with the cardiology offices of Dr. Dietrich Pates.  He is unsure when the symptoms started since he awoke with them this morning. The symptoms are moderate at this time  The history is provided by the patient and medical records.    Past Medical History  Diagnosis Date  . Coronary artery disease   . Hypertension   . Hyperlipidemia   . OSA on CPAP   . Obesity   . Diabetes mellitus   . Diastolic heart failure     History reviewed. No pertinent past surgical history.  No family history on file.  History  Substance Use Topics  . Smoking status: Current Everyday Smoker    Types: Cigarettes  . Smokeless tobacco: Never Used  . Alcohol Use: Yes     occ      Review of Systems  All other systems  reviewed and are negative.    Physical Exam  BP 151/83  Pulse 73  Temp(Src) 98.2 F (36.8 C) (Oral)  Resp 20  Ht 6\' 1"  (1.854 m)  Wt 255 lb (115.667 kg)  BMI 33.64 kg/m2  SpO2 98%  Physical Exam  Nursing note and vitals reviewed. Constitutional: He appears well-developed and well-nourished. No distress.  HENT:  Head: Normocephalic and atraumatic.  Mouth/Throat: Oropharynx is clear and moist. No oropharyngeal exudate.       Adentulous, mucous membranes moist, uvula midline.  Eyes: Conjunctivae and EOM are normal. Pupils are equal, round, and reactive to light. Right eye exhibits no discharge. Left eye exhibits no discharge. No scleral icterus.  Neck: Normal range of motion. Neck supple. No JVD present. No thyromegaly present.  Cardiovascular: Normal rate, regular rhythm, normal heart sounds and intact distal pulses.  Exam reveals no gallop and no friction rub.   No murmur heard. Pulmonary/Chest: Effort normal and breath sounds normal. No respiratory distress. He has no wheezes. He has no rales.       Large anterior midline scar of the chest  Abdominal: Soft. Bowel sounds are normal. He exhibits no distension and no mass. There is no tenderness.  Musculoskeletal: Normal range of motion. He exhibits edema (1+ bilateral pitting edema of the lower extremities).  He exhibits no tenderness.  Lymphadenopathy:    He has no cervical adenopathy.  Neurological: He is alert. Coordination normal.       Speech is normal, coordination is normal and has no limb ataxia. Right upper and lower extremities with normal range of motion, strength, sensation. Left lower extremity with mild weakness at the hip flexors. He is able to lift the leg off the bed for 5 seconds but there is a noticeable asymmetry in the strength of the lower extremities with the right leg being normal and the left leg being weak. There is a slight sensory deficit to the left upper extremity to both light touch and pinprick of the  hand. There is no weakness to grip. Finger-nose-finger test and heel shin test and rapid alternating movement tests are normal. Extraocular movements are normal and peripheral visual fields are normal. There is no diplopia  Skin: Skin is warm and dry. No rash noted. No erythema.  Psychiatric: He has a normal mood and affect. His behavior is normal.    ED Course  Procedures  MDM Grossly abnormal neurologic exam consistent with a cerebrovascular accident. Blood pressure currently is 151/83, pulse 73, afebrile. His blood sugar is currently 100. I do not believe that his symptoms are explained by a mild hypoglycemia.  Will push forward with CT scan of his brain and stroke labs. EKG below shows no ischemia or atrial fibrillation.  ED ECG REPORT   Date: 07/12/2011   Rate: 69  Rhythm: normal sinus rhythm  QRS Axis: right  Intervals: normal  ST/T Wave abnormalities: nonspecific ST/T changes  Conduction Disutrbances:right bundle branch block occasional PAC  Narrative Interpretation: no sig changes  Old EKG Reviewed: unchanged 08/20/2010   Results for orders placed during the hospital encounter of 07/12/11  CBC      Component Value Range   WBC 6.0  4.0 - 10.5 (K/uL)   RBC 4.35  4.22 - 5.81 (MIL/uL)   Hemoglobin 12.5 (*) 13.0 - 17.0 (g/dL)   HCT 40.9 (*) 81.1 - 52.0 (%)   MCV 86.7  78.0 - 100.0 (fL)   MCH 28.7  26.0 - 34.0 (pg)   MCHC 33.2  30.0 - 36.0 (g/dL)   RDW 91.4  78.2 - 95.6 (%)   Platelets 183  150 - 400 (K/uL)  DIFFERENTIAL      Component Value Range   Neutrophils Relative 41 (*) 43 - 77 (%)   Neutro Abs 2.5  1.7 - 7.7 (K/uL)   Lymphocytes Relative 48 (*) 12 - 46 (%)   Lymphs Abs 2.9  0.7 - 4.0 (K/uL)   Monocytes Relative 10  3 - 12 (%)   Monocytes Absolute 0.6  0.1 - 1.0 (K/uL)   Eosinophils Relative 1  0 - 5 (%)   Eosinophils Absolute 0.1  0.0 - 0.7 (K/uL)   Basophils Relative 0  0 - 1 (%)   Basophils Absolute 0.0  0.0 - 0.1 (K/uL)  COMPREHENSIVE METABOLIC PANEL       Component Value Range   Sodium 139  135 - 145 (mEq/L)   Potassium 5.5 (*) 3.5 - 5.1 (mEq/L)   Chloride 106  96 - 112 (mEq/L)   CO2 24  19 - 32 (mEq/L)   Glucose, Bld 135 (*) 70 - 99 (mg/dL)   BUN 27 (*) 6 - 23 (mg/dL)   Creatinine, Ser 2.13  0.50 - 1.35 (mg/dL)   Calcium 9.4  8.4 - 08.6 (mg/dL)   Total Protein 6.7  6.0 - 8.3 (g/dL)   Albumin 3.3 (*) 3.5 - 5.2 (g/dL)   AST 12  0 - 37 (U/L)   ALT 7  0 - 53 (U/L)   Alkaline Phosphatase 68  39 - 117 (U/L)   Total Bilirubin 0.2 (*) 0.3 - 1.2 (mg/dL)   GFR calc non Af Amer 59 (*) >60 (mL/min)   GFR calc Af Amer >60  >60 (mL/min)  CK TOTAL AND CKMB      Component Value Range   Total CK 41  7 - 232 (U/L)   CK, MB 2.1  0.3 - 4.0 (ng/mL)   Relative Index RELATIVE INDEX IS INVALID  0.0 - 2.5   TROPONIN I      Component Value Range   Troponin I <0.30  <0.30 (ng/mL)  POCT I-STAT, CHEM 8      Component Value Range   Sodium 139  135 - 145 (mEq/L)   Potassium 5.4 (*) 3.5 - 5.1 (mEq/L)   Chloride 109  96 - 112 (mEq/L)   BUN 28 (*) 6 - 23 (mg/dL)   Creatinine, Ser 7.82 (*) 0.50 - 1.35 (mg/dL)   Glucose, Bld 956 (*) 70 - 99 (mg/dL)   Calcium, Ion 2.13  0.86 - 1.32 (mmol/L)   TCO2 21  0 - 100 (mmol/L)   Hemoglobin 13.3  13.0 - 17.0 (g/dL)   HCT 57.8  46.9 - 62.9 (%)  GLUCOSE, CAPILLARY      Component Value Range   Glucose-Capillary 129 (*) 70 - 99 (mg/dL)   Ct Head Wo Contrast  07/12/2011  *RADIOLOGY REPORT*  Clinical Data: Left leg and arm weakness and numbness.  CT HEAD WITHOUT CONTRAST  Technique:  Contiguous axial images were obtained from the base of the skull through the vertex without contrast.  Comparison: None.  Findings: Vascular calcifications in the intracranial arteries. Large area of encephalomalacia in the right occipital region likely representing old infarct.  No mass effect or midline shift.  Mild atrophy.  Gray-white matter junctions are distinct.  Basal cisterns are not effaced.  No abnormal extra-axial fluid collections.  No  evidence of acute intracranial hemorrhage.  No depressed skull fractures.  Old left orbital fractures post plate and screw fixation.  Nasal bone fractures.  Probable old right medial orbital wall fracture.  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  Atrophy.  Vascular calcifications.  Old nasal and orbital fractures.  No evidence of acute intracranial hemorrhage, mass lesion, or acute infarct.  Original Report Authenticated By: Marlon Pel, M.D.    Laboratory workup reviewed and shows slight hyperkalemia and mild renal insufficiency. Otherwise no acute findings. CT scan reviewed. No acute hemorrhage. I have discussed the care with Dr. Lendell Caprice of the [facility] who has agreed with temporary admission orders to a monitored bed.   Vida Roller, MD 07/12/11 [phone]

## 2011-07-12 NOTE — H&P (Signed)
Hospital Admission Note Date: 07/12/2011  Patient name: [name] Medical record number: [id number] Date of birth: [birth year] Age: 55 y.o. Gender: male PCP: Dorrene German, MD  Attending physician: Christiane Ha  Chief Complaint: Left side weak  History of Present Illness: [name] is an 55 y.o. male who presents via EMS after waking at about 4 AM with left-sided weakness. He has chronic left hand numbness from previous stroke several years ago. However, his left arm and leg were suddenly weak today. He had a difficult time walking. He could not hold up his left arm. His symptoms have improved but did not feel back to baseline. He takes aspirin 81 mg a day. He has a history of diabetes, hypertension, obstructive sleep apnea, hyperlipidemia, and tobacco abuse. He has had no headache. He had a CT scan of the brain in the emergency room which showed an old stroke, but nothing new. He reports that his blood pressure and diabetes as well as hyperlipidemia have been controlled as far as he knows.  Past Medical History  Diagnosis Date  . Coronary artery disease   . Hypertension   . Hyperlipidemia   . OSA on CPAP   . Obesity   . Diabetes mellitus   . Diastolic heart failure    previous H&P also lists left carotid endarterectomy, the patient denies. History of coronary artery bypass graft  Meds: Prescriptions prior to admission  Medication Sig Dispense Refill  . aspirin 81 MG tablet Take 81 mg by mouth daily.        Marland Kitchen lisinopril-hydrochlorothiazide (PRINZIDE,ZESTORETIC) 20-12.5 MG per tablet Take 1 tablet by mouth daily.        Marland Kitchen losartan (COZAAR) 100 MG tablet Take 100 mg by mouth daily.        . metFORMIN (GLUCOPHAGE) 1000 MG tablet Take 1,000 mg by mouth 2 (two) times daily with a meal.       . metoprolol (TOPROL-XL) 50 MG 24 hr tablet Take 75 mg by mouth daily.        . simvastatin (ZOCOR) 40 MG tablet Take 40 mg by mouth at bedtime.        Marland Kitchen DISCONTD: metFORMIN  (GLUMETZA) 1000 MG (MOD) 24 hr tablet Take 1,000 mg by mouth 2 (two) times daily.       Marland Kitchen DISCONTD: metoprolol (TOPROL XL) 50 MG 24 hr tablet        . DISCONTD: glimepiride (AMARYL) 4 MG tablet Take 4 mg by mouth daily before breakfast.         Allergies: Review of patient's allergies indicates no known allergies.   Social history: The patient lives with family. He is single. He smokes a pack of cigarettes a day. He denies heavy drinking or drug abuse.   Family history: Reviewed and as per previous H&P.  Review of Systems: Systems reviewed and as above otherwise negative.  Physical Exam: Blood pressure 125/59, pulse 65, temperature 98.1 F (36.7 C), temperature source Oral, resp. rate 20, height 6\' 1"  (1.854 m), weight 115.667 kg (255 lb), SpO2 99.00%. BP 125/59  Pulse 65  Temp(Src) 98.1 F (36.7 C) (Oral)  Resp 20  Ht 6\' 1"  (1.854 m)  Wt 115.667 kg (255 lb)  BMI 33.64 kg/m2  SpO2 99%  General Appearance:    Alert, cooperative, no distress, appears stated age  Head:    Normocephalic, without obvious abnormality, atraumatic  Eyes:    PERRL, conjunctiva/corneas clear, EOM's intact, fundi    benign, both  eyes          Nose:   Nares normal, septum midline, mucosa normal, no drainage    or sinus tenderness  Throat:   Lips, mucosa, and tongue normal; edentulous   Neck:   Supple, symmetrical, trachea midline, no adenopathy;       thyroid:  No enlargement/tenderness/nodules; no carotid   bruit or JVD  Back:     Symmetric, no curvature, ROM normal, no CVA tenderness  Lungs:     Clear to auscultation bilaterally, respirations unlabored  Chest wall:    No tenderness or deformity  Heart:    Regular rate and rhythm, S1 and S2 normal, no murmur, rub   or gallop  Abdomen:     Soft, non-tender, bowel sounds active all four quadrants,    no masses, no organomegaly  Genitalia:   deferred   Rectal:   deferred  Extremities:   Extremities normal, atraumatic, no cyanosis bilateral brawny edema  with multiple healed scars   Pulses:   2+ and symmetric all extremities  Skin:   Skin color, texture, turgor normal, no rashes or lesions  Lymph nodes:   Cervical, supraclavicular, and axillary nodes normal  Neurologic:  cranial nerves intact. Left arm motor strength possibly slightly weaker than the right though largely intact left leg slightly weaker. Romberg negative. Gait normal. Difficulty with heel-to-toe walking but patient reports this is chronic. Diminished sensation in the left hand and arm which is chronic    Lab results: Basic Metabolic Panel:  Basename 07/12/11 0627 07/12/11 0611  NA 139 139  K 5.4* 5.5*  CL 109 106  CO2 -- 24  GLUCOSE 125* 135*  BUN 28* 27*  CREATININE 1.40* 1.27  CALCIUM -- 9.4  MG -- --  PHOS -- --   Liver Function Tests:  Basename 07/12/11 0611  AST 12  ALT 7  ALKPHOS 68  BILITOT 0.2*  PROT 6.7  ALBUMIN 3.3*   No results found for this basename: LIPASE:2,AMYLASE:2 in the last 72 hours No results found for this basename: AMMONIA:2 in the last 72 hours CBC:  Basename 07/12/11 0627 07/12/11 0611  WBC -- 6.0  NEUTROABS -- 2.5  HGB 13.3 12.5*  HCT 39.0 37.7*  MCV -- 86.7  PLT -- 183   Cardiac Enzymes:  Basename 07/12/11 0611  CKTOTAL 41  CKMB 2.1  CKMBINDEX --  TROPONINI <0.30   BNP: No results found for this basename: POCBNP:3 in the last 72 hours D-Dimer: No results found for this basename: DDIMER:2 in the last 72 hours CBG:  Basename 07/12/11 0647  GLUCAP 129*   Hemoglobin A1C: No results found for this basename: HGBA1C in the last 72 hours Fasting Lipid Panel: No results found for this basename: CHOL,HDL,LDLCALC,TRIG,CHOLHDL,LDLDIRECT in the last 72 hours Thyroid Function Tests: No results found for this basename: TSH,T4TOTAL,FREET4,T3FREE,THYROIDAB in the last 72 hours Anemia Panel: No results found for this basename: VITAMINB12,FOLATE,FERRITIN,TIBC,IRON,RETICCTPCT in the last 72 hours Alcohol Level: No results  found for this basename: ETH:2 in the last 72 hours  Imaging results:  Ct Head Wo Contrast  07/12/2011  *RADIOLOGY REPORT*  Clinical Data: Left leg and arm weakness and numbness.  CT HEAD WITHOUT CONTRAST  Technique:  Contiguous axial images were obtained from the base of the skull through the vertex without contrast.  Comparison: None.  Findings: Vascular calcifications in the intracranial arteries. Large area of encephalomalacia in the right occipital region likely representing old infarct.  No mass effect or midline shift.  Mild atrophy.  Gray-white matter junctions are distinct.  Basal cisterns are not effaced.  No abnormal extra-axial fluid collections.  No evidence of acute intracranial hemorrhage.  No depressed skull fractures.  Old left orbital fractures post plate and screw fixation.  Nasal bone fractures.  Probable old right medial orbital wall fracture.  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  Atrophy.  Vascular calcifications.  Old nasal and orbital fractures.  No evidence of acute intracranial hemorrhage, mass lesion, or acute infarct.  Original Report Authenticated By: Marlon Pel, M.D.    EKG shows normal sinus rhythm with PACs and old right bundle branch block  Assessment & Plan: Principal Problem:  *Stroke, ischemic Active Problems:  DIABETES MELLITUS  HYPERTENSION, UNSPECIFIED  Hyperkalemia  HYPERLIPIDEMIA-MIXED  OBSTRUCTIVE SLEEP APNEA  DIASTOLIC HEART FAILURE, CHRONIC  ATHEROSCLEROTIC CARDIOVASCULAR DISEASE  Tobacco abuse   the patient will get an MRI of the brain, MRA of the head. Carotid Dopplers, fasting lipid profile, echocardiogram. He was already on an aspirin a day, though low dose. I will put him on Aggrenox as an aspirin failure. He will get occupational therapy involved. It appears that his left leg strength has improved. Hold off on physical therapy for now. He will get neurologic checks and remain on  telemetry.  Hold ARB and ACE inhibitor do to the hyperkalemia. Recheck AP mat in the morning.  Continue metformin and monitor her blood glucose and hemoglobin A1c.  Continue CPAP for sleep apnea. His diastolic dysfunction appears compensated and he has no evidence of acute diastolic heart failure. He will need tobacco cessation counseling. He is agreeable to a patch. I will give IV hydralazine as needed for accelerated hypertension.  Arlee Santosuosso L 07/12/2011, 10:53 AM

## 2011-07-12 NOTE — ED Notes (Signed)
Pt transferred to floor via stretcher. Monitor in place.

## 2011-07-13 DIAGNOSIS — I679 Cerebrovascular disease, unspecified: Secondary | ICD-10-CM | POA: Diagnosis present

## 2011-07-13 DIAGNOSIS — G459 Transient cerebral ischemic attack, unspecified: Secondary | ICD-10-CM | POA: Diagnosis present

## 2011-07-13 LAB — BASIC METABOLIC PANEL
Calcium: 9.3 mg/dL (ref 8.4–10.5)
Creatinine, Ser: 1.05 mg/dL (ref 0.50–1.35)
GFR calc Af Amer: >60 mL/min (ref >60)

## 2011-07-13 LAB — GLUCOSE, CAPILLARY: Glucose-Capillary: 131 mg/dL — ABNORMAL HIGH (ref 70–99)

## 2011-07-13 MED ORDER — ASPIRIN-DIPYRIDAMOLE ER 25-200 MG PO CP12: 1.0000 | ORAL_CAPSULE | Freq: Two times a day (BID) | ORAL | Status: AC

## 2011-07-13 MED ORDER — KETOROLAC TROMETHAMINE 30 MG/ML IJ SOLN: 30.0000 mg | Freq: Once | INTRAMUSCULAR | Status: DC

## 2011-07-13 NOTE — Progress Notes (Signed)
Pt d/c home today. Per pt independent of adls. Compliant with medications. Per pt no d/c needs.

## 2011-07-13 NOTE — Progress Notes (Signed)
Occupational Therapy Evaluation Patient Name: [name] Date: 07/13/2011 Problem List:  Patient Active Problem List  Diagnoses  . DIABETES MELLITUS  . HYPERLIPIDEMIA-MIXED  . OVERWEIGHT/OBESITY  . OBSTRUCTIVE SLEEP APNEA  . HYPERTENSION, UNSPECIFIED  . DIASTOLIC HEART FAILURE, CHRONIC  . ATHEROSCLEROTIC CARDIOVASCULAR DISEASE  . Stroke  . Tobacco abuse  . Hyperkalemia   Past Medical History:  Past Medical History  Diagnosis Date  . Coronary artery disease   . Hypertension   . Hyperlipidemia   . OSA on CPAP   . Obesity   . Diabetes mellitus   . Diastolic heart failure    Past Surgical History:  Past Surgical History  Procedure Date  . No past surgeries     Precautions/Restrictions    Prior Functioning  Home Living Type of Home: House Lives With: Family Receives Help From: Family Home Layout: One level Bathroom Toilet: Standard Bathroom Accessibility: Yes How Accessible: Accessible via walker Prior Function Level of Independence: Independent with transfers;Other (comment) (I with BADLs except for donning socks and shoes) Comments: works on cars ADL ADL Eating/Feeding: Performed;Independent Grooming: Performed;Independent Lower Body Dressing: Performed;Minimal assistance Where Assessed - Lower Body Dressing:  (to don and doff socks (baseline)) Vision/Perception  Vision - History Baseline Vision: Wears glasses all the time Cognition Cognition Arousal/Alertness: Awake/alert Overall Cognitive Status: Appears within functional limits for tasks assessed Orientation Level: Oriented X4 Sensation/Coordination Sensation Light Touch: Appears Intact Additional Comments: LUE feels tingly, but light touch sensation is intact. Coordination Gross Motor Movements are Fluid and Coordinated: Yes (dysdiadochokinesia was slightly slower in left hand, but WFL) Fine Motor Movements are Fluid and Coordinated: Yes Extremity Assessment RUE Assessment RUE  Assessment: Within Functional Limits LUE Assessment LUE Assessment: Within Functional Limits Mobility    Exercises    End of Session OT - End of Session Activity Tolerance: Patient tolerated treatment well Patient left: in bed General Behavior During Session: [facility] for tasks performed Cognition: [facility] for tasks performed OT Assessment/Plan/Recommendation OT Assessment Clinical Impression Statement: Patient is at prior level of I with BADLs.  His left upper extremity has some tingling sensations, but AROM and strength have completely resolved to [facility]. OT Recommendation/Assessment: Patient does not need any further OT services OT Goals  N/A - recommended he continue using LUE as much as possible with his daily activities.  Shirlean Mylar, OTR/L  07/13/2011, 9:13 AM

## 2011-07-13 NOTE — Progress Notes (Signed)
Patient was placed on cpap tonight at 2200 and took off at midnight pt stated "Our machine makes too much noise than his machine at home".

## 2011-07-13 NOTE — Plan of Care (Signed)
Problem: Phase I Progression Outcomes Goal: Strict NPO til swallow screen done Outcome: Completed/Met Date Met:  07/13/11 Pt passed sss

## 2011-07-13 NOTE — Progress Notes (Signed)
UR Chart Review Completed  

## 2011-07-13 NOTE — Discharge Summary (Addendum)
Physician Discharge Summary  Patient ID: [name] MRN: [id number] DOB/AGE: [birth year] 53 y.o.  Admit date: 07/12/2011 Discharge date: 07/13/2011  Discharge Diagnoses:  Principal Problem:  *TIA (transient ischemic attack) Active Problems:  DIABETES MELLITUS  HYPERTENSION, UNSPECIFIED  Hyperkalemia  CVD (cerebrovascular disease)  HYPERLIPIDEMIA-MIXED  OBSTRUCTIVE SLEEP APNEA  DIASTOLIC HEART FAILURE, CHRONIC  ATHEROSCLEROTIC CARDIOVASCULAR DISEASE  Tobacco abuse   Current Discharge Medication List    START taking these medications   Details  dipyridamole-aspirin (AGGRENOX) 25-200 MG per 12 hr capsule Take 1 capsule by mouth 2 (two) times daily. Qty: 30 capsule, Refills: 0      CONTINUE these medications which have NOT CHANGED   Details  lisinopril-hydrochlorothiazide (PRINZIDE,ZESTORETIC) 20-12.5 MG per tablet Take 1 tablet by mouth daily.      losartan (COZAAR) 100 MG tablet Take 100 mg by mouth daily.      metFORMIN (GLUCOPHAGE) 1000 MG tablet Take 1,000 mg by mouth 2 (two) times daily with a meal.     metoprolol (TOPROL-XL) 50 MG 24 hr tablet Take 75 mg by mouth daily.      simvastatin (ZOCOR) 40 MG tablet Take 40 mg by mouth at bedtime.        STOP taking these medications     aspirin 81 MG tablet      metFORMIN (GLUMETZA) 1000 MG (MOD) 24 hr tablet      glimepiride (AMARYL) 4 MG tablet         Discharge Orders    Future Orders Please Complete By Expires   Diet - low sodium heart healthy      Diet Carb Modified      Discharge instructions      Comments:   Stop smoking   Activity as tolerated - No restrictions         Follow-up Information    Follow up with AVBUERE,EDWIN A. (As previously scheduled to check potassium and cholesterol)    Contact information:   [address] [place] [address] [phone]          Disposition:   Discharged Condition:   Consults:    Labs:   Results for orders placed during  the hospital encounter of 07/12/11 (from the past 48 hour(s))  PROTIME-INR     Status: Normal   Collection Time   07/12/11  6:11 AM      Component Value Range Comment   Prothrombin Time 13.8  11.6 - 15.2 (seconds)    INR 1.04  0.00 - 1.49    APTT     Status: Normal   Collection Time   07/12/11  6:11 AM      Component Value Range Comment   aPTT 24  24 - 37 (seconds)   CBC     Status: Abnormal   Collection Time   07/12/11  6:11 AM      Component Value Range Comment   WBC 6.0  4.0 - 10.5 (K/uL)    RBC 4.35  4.22 - 5.81 (MIL/uL)    Hemoglobin 12.5 (*) 13.0 - 17.0 (g/dL)    HCT 62.9 (*) 52.8 - 52.0 (%)    MCV 86.7  78.0 - 100.0 (fL)    MCH 28.7  26.0 - 34.0 (pg)    MCHC 33.2  30.0 - 36.0 (g/dL)    RDW 41.3  24.4 - 01.0 (%)    Platelets 183  150 - 400 (K/uL)   DIFFERENTIAL     Status: Abnormal   Collection Time  07/12/11  6:11 AM      Component Value Range Comment   Neutrophils Relative 41 (*) 43 - 77 (%)    Neutro Abs 2.5  1.7 - 7.7 (K/uL)    Lymphocytes Relative 48 (*) 12 - 46 (%)    Lymphs Abs 2.9  0.7 - 4.0 (K/uL)    Monocytes Relative 10  3 - 12 (%)    Monocytes Absolute 0.6  0.1 - 1.0 (K/uL)    Eosinophils Relative 1  0 - 5 (%)    Eosinophils Absolute 0.1  0.0 - 0.7 (K/uL)    Basophils Relative 0  0 - 1 (%)    Basophils Absolute 0.0  0.0 - 0.1 (K/uL)   COMPREHENSIVE METABOLIC PANEL     Status: Abnormal   Collection Time   07/12/11  6:11 AM      Component Value Range Comment   Sodium 139  135 - 145 (mEq/L)    Potassium 5.5 (*) 3.5 - 5.1 (mEq/L)    Chloride 106  96 - 112 (mEq/L)    CO2 24  19 - 32 (mEq/L)    Glucose, Bld 135 (*) 70 - 99 (mg/dL)    BUN 27 (*) 6 - 23 (mg/dL)    Creatinine, Ser 1.91  0.50 - 1.35 (mg/dL)    Calcium 9.4  8.4 - 10.5 (mg/dL)    Total Protein 6.7  6.0 - 8.3 (g/dL)    Albumin 3.3 (*) 3.5 - 5.2 (g/dL)    AST 12  0 - 37 (U/L)    ALT 7  0 - 53 (U/L)    Alkaline Phosphatase 68  39 - 117 (U/L)    Total Bilirubin 0.2 (*) 0.3 - 1.2 (mg/dL)    GFR calc  non Af Amer 59 (*) >60 (mL/min)    GFR calc Af Amer >60  >60 (mL/min)   CK TOTAL AND CKMB     Status: Normal   Collection Time   07/12/11  6:11 AM      Component Value Range Comment   Total CK 41  7 - 232 (U/L)    CK, MB 2.1  0.3 - 4.0 (ng/mL)    Relative Index RELATIVE INDEX IS INVALID  0.0 - 2.5    TROPONIN I     Status: Normal   Collection Time   07/12/11  6:11 AM      Component Value Range Comment   Troponin I <0.30  <0.30 (ng/mL)   POCT I-STAT, CHEM 8     Status: Abnormal   Collection Time   07/12/11  6:27 AM      Component Value Range Comment   Sodium 139  135 - 145 (mEq/L)    Potassium 5.4 (*) 3.5 - 5.1 (mEq/L)    Chloride 109  96 - 112 (mEq/L)    BUN 28 (*) 6 - 23 (mg/dL)    Creatinine, Ser 4.78 (*) 0.50 - 1.35 (mg/dL)    Glucose, Bld 295 (*) 70 - 99 (mg/dL)    Calcium, Ion 6.21  1.12 - 1.32 (mmol/L)    TCO2 21  0 - 100 (mmol/L)    Hemoglobin 13.3  13.0 - 17.0 (g/dL)    HCT 30.8  65.7 - 84.6 (%)   GLUCOSE, CAPILLARY     Status: Abnormal   Collection Time   07/12/11  6:47 AM      Component Value Range Comment   Glucose-Capillary 129 (*) 70 - 99 (mg/dL)   HEMOGLOBIN N6E  Status: Abnormal   Collection Time   07/12/11 10:46 AM      Component Value Range Comment   Hemoglobin A1C 5.7 (*) <5.7 (%)    Mean Plasma Glucose 117 (*) <117 (mg/dL)   LIPID PANEL     Status: Normal   Collection Time   07/12/11 10:46 AM      Component Value Range Comment   Cholesterol 127  0 - 200 (mg/dL)    Triglycerides 84  <161 (mg/dL)    HDL 46  >09 (mg/dL)    Total CHOL/HDL Ratio 2.8      VLDL 17  0 - 40 (mg/dL)    LDL Cholesterol 64  0 - 99 (mg/dL)   GLUCOSE, CAPILLARY     Status: Abnormal   Collection Time   07/12/11  4:00 PM      Component Value Range Comment   Glucose-Capillary 129 (*) 70 - 99 (mg/dL)   GLUCOSE, CAPILLARY     Status: Abnormal   Collection Time   07/12/11  9:05 PM      Component Value Range Comment   Glucose-Capillary 221 (*) 70 - 99 (mg/dL)   GLUCOSE, CAPILLARY      Status: Abnormal   Collection Time   07/12/11  9:07 PM      Component Value Range Comment   Glucose-Capillary 215 (*) 70 - 99 (mg/dL)   BASIC METABOLIC PANEL     Status: Abnormal   Collection Time   07/13/11  5:22 AM      Component Value Range Comment   Sodium 136  135 - 145 (mEq/L)    Potassium 5.0  3.5 - 5.1 (mEq/L)    Chloride 106  96 - 112 (mEq/L)    CO2 26  19 - 32 (mEq/L)    Glucose, Bld 115 (*) 70 - 99 (mg/dL)    BUN 19  6 - 23 (mg/dL)    Creatinine, Ser 6.04  0.50 - 1.35 (mg/dL)    Calcium 9.3  8.4 - 10.5 (mg/dL)    GFR calc non Af Amer >60  >60 (mL/min)    GFR calc Af Amer >60  >60 (mL/min)   GLUCOSE, CAPILLARY     Status: Abnormal   Collection Time   07/13/11  7:36 AM      Component Value Range Comment   Glucose-Capillary 131 (*) 70 - 99 (mg/dL)     Diagnostics:  Ct Head Wo Contrast  07/12/2011  *RADIOLOGY REPORT*  Clinical Data: Left leg and arm weakness and numbness.  CT HEAD WITHOUT CONTRAST  Technique:  Contiguous axial images were obtained from the base of the skull through the vertex without contrast.  Comparison: None.  Findings: Vascular calcifications in the intracranial arteries. Large area of encephalomalacia in the right occipital region likely representing old infarct.  No mass effect or midline shift.  Mild atrophy.  Gray-white matter junctions are distinct.  Basal cisterns are not effaced.  No abnormal extra-axial fluid collections.  No evidence of acute intracranial hemorrhage.  No depressed skull fractures.  Old left orbital fractures post plate and screw fixation.  Nasal bone fractures.  Probable old right medial orbital wall fracture.  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  Atrophy.  Vascular calcifications.  Old nasal and orbital fractures.  No evidence of acute intracranial hemorrhage, mass lesion, or acute infarct.  Original Report Authenticated By: Marlon Pel, M.D.   Mr Angiogram Head Wo Contrast  07/12/2011   *RADIOLOGY  REPORT*  Clinical Data: Remote right PCA stroke. New left-sided symptoms.  MRA HEAD WITHOUT CONTRAST  Technique: Angiographic images of the Circle of Willis were obtained using MRA technique without intravenous contrast.  Comparison: CT head without contrast 07/12/2011.  MRI brain 07/12/2011.  Findings: Moderate tortuosity of the left internal carotid artery is present.  There is no focal stenosis.  Minimal irregularity is noted in the cavernous segments bilaterally.  The A1 and M1 segments are normal bilaterally.  The anterior communicating artery is not clearly visualized and may not be present.  The MCA bifurcations are normal.  There is some attenuation of distal MCA branch vessels.  The vertebral arteries are codominant.  The left PICA origin is visualized and normal.  The right PICA origin is not visualized. The basilar artery is normal.  There is focal signal loss in the mid right P2 segment compatible with high-grade stenosis and likely related to the patient's prior infarct.  The attenuation of left PCA branch vessels is more distal.  IMPRESSION:  1.  Focal signal loss compatible with high-grade stenosis or occlusion of the distal right P2 segment.  This corresponds with the patient's remote infarct. 2.  Small vessel disease. 3.  No other significant proximal stenosis, aneurysm, or branch vessel occlusion. 4.  Tortuous left internal carotid artery, compatible with chronic hypertension.  Original Report Authenticated By: Jamesetta Orleans. MATTERN, M.D.   Mri Brain Without Contrast  07/12/2011  *RADIOLOGY REPORT*  Clinical Data: Chronic left hand numbness with new onset of left arm and leg weakness.  MRI HEAD WITHOUT CONTRAST  Technique:  Multiplanar, multiecho pulse sequences of the brain and surrounding structures were obtained according to standard protocol without intravenous contrast.  Comparison: CT head without contrast 07/12/2011.  Findings: A remote right PCA territory infarct is evident.   The diffusion weighted images demonstrate no evidence for acute or subacute infarction.  There is some gliosis adjacent to the infarct.  There is a remote lacunar infarct of the right lentiform nucleus and another of the right thalamus.  No other significant infarcts are present.  Periventricular and scattered subcortical T2 and FLAIR hyperintensities are evident bilaterally.  Flow is present in the major intracranial arteries.  The globes and orbits are intact.  The paranasal sinuses and mastoid air cells are clear.  IMPRESSION:  1.  Remote right PCA territory infarct. 2.  Remote lacunar infarcts of the right basal ganglia. 3.  No acute intracranial abnormality. 4.  Atrophy and white matter disease is somewhat advanced for age. Given the other infarct, this likely reflects the sequelae of chronic microvascular ischemia.  Original Report Authenticated By: Jamesetta Orleans. MATTERN, M.D.   US Carotid Duplex Bilateral  07/12/2011  *RADIOLOGY REPORT*  Clinical Data: Hypertension, diabetes, tobacco use, coronary disease  BILATERAL CAROTID DUPLEX ULTRASOUND  Technique: Gray scale imaging, color Doppler and duplex ultrasound was performed of bilateral carotid and vertebral arteries in the neck.  Comparison:  None.  Criteria:  Quantification of carotid stenosis is based on velocity parameters that correlate the residual internal carotid diameter with NASCET-based stenosis levels, using the diameter of the distal internal carotid lumen as the denominator for stenosis measurement.  The following velocity measurements were obtained:                   PEAK SYSTOLIC/END DIASTOLIC RIGHT ICA:                        58/20cm/sec CCA:  84/16cm/sec SYSTOLIC ICA/CCA RATIO:     0.69 DIASTOLIC ICA/CCA RATIO:    1.27 ECA:                        57cm/sec  LEFT ICA:                        57/20cm/sec CCA:                        84/13cm/sec SYSTOLIC ICA/CCA RATIO:     0.68 DIASTOLIC ICA/CCA RATIO:    1.56 ECA:                         56 cm/sec  Findings:  RIGHT CAROTID ARTERY: mild bifurcation heterogeneous plaque formation.  No hemodynamically significant ICA stenosis, velocity elevation, or turbulent flow detected.  RIGHT VERTEBRAL ARTERY:  Antegrade  LEFT CAROTID ARTERY: Mild diffuse heterogeneous left carotid bifurcation atherosclerosis.  No hemodynamically significant left ICA stenosis, velocity elevation, or turbulent flow.  LEFT VERTEBRAL ARTERY:  Antegrade  IMPRESSION: Bilateral mild carotid atherosclerosis.  No hemodynamically significant ICA stenosis.  Original Report Authenticated By: Judie Petit. Ruel Favors, M.D.   EKG: Normal sinus rhythm with chronic right bundle branch block   - Left ventricle: The cavity size was normal. There was moderate concentric hypertrophy. Systolic function was mildly reduced. The estimated ejection fraction was in the range of 45% to 50%. Moderate to severe hypokinesis of the inferior myocardium. - Aortic valve: Mildly calcified annulus. Normal thickness leaflets. - Mitral valve: Calcified annulus. - Left atrium: The atrium was mildly to moderately dilated. - Right atrium: The atrium was mildly to moderately dilated. - Atrial septum: No defect or patent foramen ovale was identified. Impressions:  - Compared to the prior study perofrmed 07/12/11, abnormal septal motion no longer noted; inferior wall motion abnormality now identified..   Full Code   Hospital Course: Please see H&P for complete admission details. Mr. Elsass is a pleasant 55 year old black male with multiple medical problems including previous stroke. He woke up with left-sided weakness. By the time he was seen in the emergency room, his symptoms were much improved. Currently he is back to baseline. When I evaluated him he had numbness in the left arm with possibly some slight weakness of the left arm and leg. He has chronic left arm and hand numbness from previous stroke. He is right-handed. He had been on aspirin  daily he has a history of diabetes hypertension hyperlipidemia. He had a workup for stroke and remained on telemetry where he was in normal sinus rhythm. He shows no acute changes on his MRI. His renovascular disease is likely old. Echocardiogram shows no source of embolus and he has an ejection fraction which is probably unchanged from previous. He does smoke cigarettes and we discussed the need to eliminate this and control his other risk factors for stroke. Fasting lipids have been done but are pending. This will need to be followed up as an outpatient. His hemoglobin A1c was good. He worked with occupational therapy who felt that he was back to baseline. He had previously been on aspirin 81 mg a day and reported compliance with it. Therefore we will switch this to Aggrenox.  He complains of some left arm pain and neck pain today which I suspect is musculoskeletal. I will give him a dose of Toradol and he can be discharged home on  Aggrenox.   His potassium was slightly elevated on admission but is normal today. This will need to be repeated as an outpatient and if still elevated consider stopping his ACE. He has been off his ACE inhibitor and ARB. His blood pressure today is ranging 110-120 systolic. I will therefore stop his ARB to avoid the likelihood of hypo-tension in the setting of TIA. His blood pressure will need to be rechecked as an outpatient as well. The rest of his medical problems remain stable. Total time on the day of discharge is greater than 30 minutes.  Dischar ge Exam: Blood pressure 119/77, pulse 64, temperature 98.2 F (36.8 C), temperature source Oral, resp. rate 20, height 6\' 1"  (1.854 m), weight 115.667 kg (255 lb), SpO2 98.00%. Has some left neck muscle spasm. Motor strength 5 out of 5 diminished sensation in the right hand and arm as previous.    SignedChristiane Ha 07/13/2011, 10:53 AM

## 2011-07-13 NOTE — Progress Notes (Signed)
IV removed, site WNL.  Pt given d/c instructions and new prescription.  Discussed home care with patient and discussed home medications, patient verbalizes undstanding. F/U appointments in place, pt states they will keep appts. Pt A&O and stable at this time.  Pt educated on Smoking cessation and stroke prevention.  Pt escorted to ED entrance by staff member, (refused wheelchair).

## 2011-07-20 NOTE — Progress Notes (Signed)
Encounter addended by: Clarene Critchley on: 07/20/2011  7:10 AM<BR>     Documentation filed: Flowsheet VN

## 2011-08-04 LAB — CBC
HCT: 22.9 — ABNORMAL LOW
HCT: 23 — ABNORMAL LOW
HCT: 23 — ABNORMAL LOW
HCT: 24.7 — ABNORMAL LOW
HCT: 27.4 — ABNORMAL LOW
HCT: 34.7 — ABNORMAL LOW
HCT: 36.5 — ABNORMAL LOW
HCT: 22.5 — ABNORMAL LOW
HCT: 23.8 — ABNORMAL LOW
HCT: 23.9 — ABNORMAL LOW
HCT: 24.2 — ABNORMAL LOW
HCT: 24.6 — ABNORMAL LOW
HCT: 25.1 — ABNORMAL LOW
HCT: 26.7 — ABNORMAL LOW
Hemoglobin: 12 — ABNORMAL LOW
Hemoglobin: 12.8 — ABNORMAL LOW
Hemoglobin: 7.7 — CL
Hemoglobin: 7.7 — CL
Hemoglobin: 7.9 — CL
Hemoglobin: 8.7 — ABNORMAL LOW
Hemoglobin: 9.5 — ABNORMAL LOW
Hemoglobin: 7.7 — CL
Hemoglobin: 8.1 — ABNORMAL LOW
Hemoglobin: 8.1 — ABNORMAL LOW
Hemoglobin: 8.2 — ABNORMAL LOW
Hemoglobin: 8.3 — ABNORMAL LOW
MCHC: 33.7
MCHC: 33.8
MCHC: 33.9
MCHC: 34.1
MCHC: 34.3
MCHC: 34.4
MCHC: 34.8
MCHC: 35
MCHC: 33.5
MCHC: 33.8
MCHC: 33.8
MCHC: 34.2
MCHC: 34.3
MCHC: 34.6
MCV: 87.9
MCV: 88.4
MCV: 88.5
MCV: 88.9
MCV: 89.2
MCV: 89.3
MCV: 89.7
MCV: 90.4
MCV: 88.3
MCV: 88.4
MCV: 88.4
MCV: 88.5
MCV: 88.5
MCV: 88.6
MCV: 88.8
MCV: 89.1
MCV: 89.1
MCV: 89.6
Platelets: 126 — ABNORMAL LOW
Platelets: 137 — ABNORMAL LOW
Platelets: 163
Platelets: 172
Platelets: 195
Platelets: 212
Platelets: 181
Platelets: 207
Platelets: 262
Platelets: 281
Platelets: 331
Platelets: 414 — ABNORMAL HIGH
Platelets: 419 — ABNORMAL HIGH
Platelets: 479 — ABNORMAL HIGH
RBC: 2.57 — ABNORMAL LOW
RBC: 2.6 — ABNORMAL LOW
RBC: 2.62 — ABNORMAL LOW
RBC: 2.73 — ABNORMAL LOW
RBC: 3.07 — ABNORMAL LOW
RBC: 3.88 — ABNORMAL LOW
RBC: 4.13 — ABNORMAL LOW
RBC: 2.53 — ABNORMAL LOW
RBC: 2.65 — ABNORMAL LOW
RBC: 2.68 — ABNORMAL LOW
RBC: 2.69 — ABNORMAL LOW
RBC: 2.74 — ABNORMAL LOW
RBC: 2.79 — ABNORMAL LOW
RBC: 2.79 — ABNORMAL LOW
RBC: 2.89 — ABNORMAL LOW
RBC: 3.01 — ABNORMAL LOW
RDW: 14.6
RDW: 14.7
RDW: 14.8
RDW: 15
RDW: 15
RDW: 16.5 — ABNORMAL HIGH
RDW: 15.6 — ABNORMAL HIGH
RDW: 15.8 — ABNORMAL HIGH
RDW: 15.8 — ABNORMAL HIGH
RDW: 16 — ABNORMAL HIGH
RDW: 16.1 — ABNORMAL HIGH
RDW: 16.6 — ABNORMAL HIGH
WBC: 10.5
WBC: 10.9 — ABNORMAL HIGH
WBC: 11.3 — ABNORMAL HIGH
WBC: 6.8
WBC: 7
WBC: 7.7
WBC: 8.1
WBC: 8.4
WBC: 10.1
WBC: 10.5
WBC: 11.8 — ABNORMAL HIGH
WBC: 7.1
WBC: 7.9
WBC: 8
WBC: 8.6
WBC: 8.9
WBC: 9.3
WBC: 9.8

## 2011-08-04 LAB — POCT I-STAT 3, ART BLOOD GAS (G3+)
Acid-Base Excess: 1
Acid-base deficit: 4 — ABNORMAL HIGH
Acid-base deficit: 5 — ABNORMAL HIGH
Bicarbonate: 21.7
Bicarbonate: 23.5
Bicarbonate: 26.5 — ABNORMAL HIGH
Bicarbonate: 26.6 — ABNORMAL HIGH
O2 Saturation: 100
O2 Saturation: 89
O2 Saturation: 91
Operator id: 209401
Operator id: 209401
Operator id: 209401
Operator id: 209401
Operator id: 3390
Patient temperature: 33
Patient temperature: 36.5
Patient temperature: 37.4
Patient temperature: 37.4
Patient temperature: 37.5
Patient temperature: 97.5
TCO2: 23
TCO2: 23
TCO2: 25
TCO2: 28
pCO2 arterial: 36.7
pCO2 arterial: 37.4
pCO2 arterial: 38
pCO2 arterial: 38.7
pCO2 arterial: 46.1 — ABNORMAL HIGH
pH, Arterial: 7.282 — ABNORMAL LOW
pH, Arterial: 7.316 — ABNORMAL LOW
pH, Arterial: 7.336 — ABNORMAL LOW
pH, Arterial: 7.365
pH, Arterial: 7.368
pH, Arterial: 7.372
pH, Arterial: 7.389
pH, Arterial: 7.411
pO2, Arterial: 108 — ABNORMAL HIGH
pO2, Arterial: 68 — ABNORMAL LOW
pO2, Arterial: 79 — ABNORMAL LOW
pO2, Arterial: 81

## 2011-08-04 LAB — POCT CARDIAC MARKERS
CKMB, poc: <1 — ABNORMAL LOW
CKMB, poc: <1 — ABNORMAL LOW
Myoglobin, poc: 102
Myoglobin, poc: 72.3
Myoglobin, poc: 92.9
Operator id: 247131
Operator id: 264761
Operator id: 272551
Troponin i, poc: 0.09 — ABNORMAL HIGH
Troponin i, poc: 0.16 — ABNORMAL HIGH
Troponin i, poc: <0.05
Troponin i, poc: <0.05

## 2011-08-04 LAB — CREATININE, SERUM
Creatinine, Ser: 1.47
Creatinine, Ser: 2.55 — ABNORMAL HIGH
GFR calc Af Amer: 33 — ABNORMAL LOW
GFR calc Af Amer: >60
GFR calc non Af Amer: 27 — ABNORMAL LOW
GFR calc non Af Amer: 51 — ABNORMAL LOW

## 2011-08-04 LAB — BLOOD GAS, ARTERIAL
Acid-Base Excess: 1.1
Bicarbonate: 25.4 — ABNORMAL HIGH
FIO2: 0.21
O2 Saturation: 94.3
Patient temperature: 98.6
TCO2: 26.7
pCO2 arterial: 42.1
pH, Arterial: 7.398
pO2, Arterial: 72.8 — ABNORMAL LOW

## 2011-08-04 LAB — CARDIAC PANEL(CRET KIN+CKTOT+MB+TROPI)
CK, MB: 2.7
CK, MB: 0.9
Relative Index: 2.1
Relative Index: INVALID
Relative Index: INVALID
Relative Index: INVALID
Total CK: 82
Total CK: 85
Total CK: 81
Troponin I: 0.4 — ABNORMAL HIGH

## 2011-08-04 LAB — BASIC METABOLIC PANEL
BUN: 17
BUN: 21
BUN: 22
BUN: 30 — ABNORMAL HIGH
BUN: 30 — ABNORMAL HIGH
BUN: 31 — ABNORMAL HIGH
BUN: 31 — ABNORMAL HIGH
BUN: 34 — ABNORMAL HIGH
BUN: 35 — ABNORMAL HIGH
BUN: 29 — ABNORMAL HIGH
CO2: 22
CO2: 24
CO2: 24
CO2: 27
CO2: 28
CO2: 29
CO2: 29
CO2: 30
CO2: 30
CO2: 30
CO2: 31
CO2: 33 — ABNORMAL HIGH
Calcium: 8 — ABNORMAL LOW
Calcium: 8.2 — ABNORMAL LOW
Calcium: 9.2
Calcium: 9.5
Calcium: 8.6
Calcium: 8.6
Calcium: 8.7
Calcium: 8.8
Calcium: 8.9
Calcium: 8.9
Calcium: 9.1
Calcium: 9.8
Calcium: 9.5
Chloride: 104
Chloride: 104
Chloride: 104
Chloride: 102
Chloride: 102
Chloride: 102
Chloride: 96
Chloride: 97
Chloride: 97
Chloride: 98
Chloride: 98
Creatinine, Ser: 1.75 — ABNORMAL HIGH
Creatinine, Ser: 2.7 — ABNORMAL HIGH
Creatinine, Ser: 1.04
Creatinine, Ser: 1.07
Creatinine, Ser: 1.13
Creatinine, Ser: 1.16
Creatinine, Ser: 1.26
Creatinine, Ser: 1.32
Creatinine, Ser: 1.38
Creatinine, Ser: 1.63 — ABNORMAL HIGH
Creatinine, Ser: 1.78 — ABNORMAL HIGH
GFR calc Af Amer: 31 — ABNORMAL LOW
GFR calc Af Amer: 50 — ABNORMAL LOW
GFR calc Af Amer: >60
GFR calc Af Amer: 49 — ABNORMAL LOW
GFR calc Af Amer: >60
GFR calc Af Amer: >60
GFR calc Af Amer: >60
GFR calc Af Amer: >60
GFR calc Af Amer: >60
GFR calc Af Amer: >60
GFR calc Af Amer: >60
GFR calc non Af Amer: 25 — ABNORMAL LOW
GFR calc non Af Amer: 42 — ABNORMAL LOW
GFR calc non Af Amer: 56 — ABNORMAL LOW
GFR calc non Af Amer: 50 — ABNORMAL LOW
GFR calc non Af Amer: 55 — ABNORMAL LOW
GFR calc non Af Amer: 58 — ABNORMAL LOW
GFR calc non Af Amer: >60
GFR calc non Af Amer: >60
GFR calc non Af Amer: >60
GFR calc non Af Amer: >60
Glucose, Bld: 122 — ABNORMAL HIGH
Glucose, Bld: 140 — ABNORMAL HIGH
Glucose, Bld: 162 — ABNORMAL HIGH
Glucose, Bld: 95
Glucose, Bld: 112 — ABNORMAL HIGH
Glucose, Bld: 117 — ABNORMAL HIGH
Glucose, Bld: 118 — ABNORMAL HIGH
Glucose, Bld: 122 — ABNORMAL HIGH
Glucose, Bld: 130 — ABNORMAL HIGH
Glucose, Bld: 79
Glucose, Bld: 86
Glucose, Bld: 88
Glucose, Bld: 96
Potassium: 5
Potassium: 5.2 — ABNORMAL HIGH
Potassium: 5.3 — ABNORMAL HIGH
Potassium: 4.1
Potassium: 4.3
Potassium: 4.3
Potassium: 4.6
Sodium: 133 — ABNORMAL LOW
Sodium: 135
Sodium: 136
Sodium: 133 — ABNORMAL LOW
Sodium: 133 — ABNORMAL LOW
Sodium: 134 — ABNORMAL LOW
Sodium: 134 — ABNORMAL LOW
Sodium: 137
Sodium: 138
Sodium: 140

## 2011-08-04 LAB — CROSSMATCH
ABO/RH(D): A POS
Antibody Screen: NEGATIVE

## 2011-08-04 LAB — LIPID PANEL
Cholesterol: 126
Cholesterol: 140
HDL: 35 — ABNORMAL LOW
LDL Cholesterol: 63
Total CHOL/HDL Ratio: 3.3
Total CHOL/HDL Ratio: 3.6
Triglycerides: 138
VLDL: 28

## 2011-08-04 LAB — PROTIME-INR
INR: 1
INR: 1.4
Prothrombin Time: 17.5 — ABNORMAL HIGH

## 2011-08-04 LAB — POCT I-STAT, CHEM 8
BUN: 17
BUN: 23
BUN: 40 — ABNORMAL HIGH
Calcium, Ion: 1.17
Chloride: 103
Creatinine, Ser: 2.5 — ABNORMAL HIGH
Glucose, Bld: 120 — ABNORMAL HIGH
Potassium: 4.7
Potassium: 4.8
Sodium: 135
Sodium: 135
Sodium: 137
TCO2: 22
TCO2: 24

## 2011-08-04 LAB — APTT
aPTT: 46 — ABNORMAL HIGH
aPTT: 63 — ABNORMAL HIGH

## 2011-08-04 LAB — COMPREHENSIVE METABOLIC PANEL
ALT: 138 — ABNORMAL HIGH
AST: 137 — ABNORMAL HIGH
Albumin: 3.6
Albumin: 2.7 — ABNORMAL LOW
Alkaline Phosphatase: 37 — ABNORMAL LOW
BUN: 25 — ABNORMAL HIGH
BUN: 26 — ABNORMAL HIGH
CO2: 30
Calcium: 9.5
Calcium: 8.9
Chloride: 103
Chloride: 102
Creatinine, Ser: 1.17
Creatinine, Ser: 3.26 — ABNORMAL HIGH
Creatinine, Ser: 1.41
GFR calc Af Amer: >60
GFR calc non Af Amer: 53 — ABNORMAL LOW
Glucose, Bld: 113 — ABNORMAL HIGH
Potassium: 5.4 — ABNORMAL HIGH
Sodium: 139
Total Bilirubin: 0.9
Total Bilirubin: 0.7
Total Protein: 7.3

## 2011-08-04 LAB — DIFFERENTIAL
Basophils Absolute: 0
Basophils Relative: 0
Basophils Relative: 1
Eosinophils Absolute: 0.3
Eosinophils Relative: 2
Eosinophils Relative: 3
Lymphocytes Relative: 45
Lymphocytes Relative: 20
Lymphs Abs: 2.4
Monocytes Absolute: 0.6
Monocytes Absolute: 0.9
Monocytes Relative: 12
Neutro Abs: 2.2
Neutro Abs: 7.2
Neutrophils Relative %: 66

## 2011-08-04 LAB — TROPONIN I: Troponin I: 0.05

## 2011-08-04 LAB — POCT I-STAT 4, (NA,K, GLUC, HGB,HCT)
Glucose, Bld: 101 — ABNORMAL HIGH
Glucose, Bld: 116 — ABNORMAL HIGH
HCT: 19 — ABNORMAL LOW
HCT: 36 — ABNORMAL LOW
Hemoglobin: 10.5 — ABNORMAL LOW
Hemoglobin: 9.2 — ABNORMAL LOW
Operator id: 284731
Operator id: 3390
Potassium: 3.6
Potassium: 4.2
Potassium: 4.4
Sodium: 135
Sodium: 136

## 2011-08-04 LAB — POCT I-STAT 7, (LYTES, BLD GAS, ICA,H+H)
Acid-base deficit: 3 — ABNORMAL HIGH
Bicarbonate: 23.1
O2 Saturation: 97
pO2, Arterial: 97

## 2011-08-04 LAB — HEPARIN LEVEL (UNFRACTIONATED)
Heparin Unfractionated: 0.28 — ABNORMAL LOW
Heparin Unfractionated: 0.48
Heparin Unfractionated: 0.56
Heparin Unfractionated: <0.1 — ABNORMAL LOW
Heparin Unfractionated: <0.1 — ABNORMAL LOW

## 2011-08-04 LAB — PREPARE RBC (CROSSMATCH)

## 2011-08-04 LAB — PREPARE FRESH FROZEN PLASMA

## 2011-08-04 LAB — POCT I-STAT 3, VENOUS BLOOD GAS (G3P V)
Acid-base deficit: 5 — ABNORMAL HIGH
Bicarbonate: 25.6 — ABNORMAL HIGH
O2 Saturation: 52
O2 Saturation: 55
O2 Saturation: 78
Patient temperature: 33
TCO2: 24
TCO2: 27
pCO2, Ven: 39.2 — ABNORMAL LOW
pO2, Ven: 34

## 2011-08-04 LAB — URINE CULTURE

## 2011-08-04 LAB — MAGNESIUM
Magnesium: 2.6 — ABNORMAL HIGH
Magnesium: 2.6 — ABNORMAL HIGH

## 2011-08-04 LAB — URINALYSIS, ROUTINE W REFLEX MICROSCOPIC
Glucose, UA: NEGATIVE
Hgb urine dipstick: NEGATIVE
Protein, ur: NEGATIVE
pH: 5.5

## 2011-08-04 LAB — TSH: TSH: 1.421

## 2011-08-04 LAB — POCT I-STAT GLUCOSE
Glucose, Bld: 127 — ABNORMAL HIGH
Glucose, Bld: 165 — ABNORMAL HIGH
Glucose, Bld: 205 — ABNORMAL HIGH
Operator id: 300801
Operator id: 3406

## 2011-08-04 LAB — TYPE AND SCREEN
ABO/RH(D): A POS
ABO/RH(D): A POS
Antibody Screen: NEGATIVE
Antibody Screen: NEGATIVE

## 2011-08-04 LAB — PREPARE PLATELET PHERESIS

## 2011-08-04 LAB — D-DIMER, QUANTITATIVE: D-Dimer, Quant: 7.62 — ABNORMAL HIGH

## 2011-08-04 LAB — HEMOGLOBIN A1C
Hgb A1c MFr Bld: 6.5 — ABNORMAL HIGH
Hgb A1c MFr Bld: 6.6 — ABNORMAL HIGH
Mean Plasma Glucose: 158

## 2011-08-04 LAB — HEMOGLOBIN AND HEMATOCRIT, BLOOD: Hemoglobin: 6.3 — CL

## 2011-08-04 LAB — IRON AND TIBC: UIBC: 215

## 2011-08-04 LAB — PLATELET COUNT: Platelets: 110 — ABNORMAL LOW

## 2011-08-04 LAB — AMYLASE: Amylase: 61

## 2011-08-23 ENCOUNTER — Other Ambulatory Visit: Payer: Self-pay | Admitting: Cardiology

## 2011-10-14 ENCOUNTER — Encounter: Payer: Self-pay | Admitting: Adult Health

## 2011-10-27 ENCOUNTER — Encounter: Payer: Self-pay | Admitting: Adult Health

## 2011-10-27 ENCOUNTER — Ambulatory Visit (INDEPENDENT_AMBULATORY_CARE_PROVIDER_SITE_OTHER): Payer: Medicaid Other | Admitting: Adult Health

## 2011-10-27 DIAGNOSIS — R0989 Other specified symptoms and signs involving the circulatory and respiratory systems: Secondary | ICD-10-CM

## 2011-10-27 DIAGNOSIS — G459 Transient cerebral ischemic attack, unspecified: Secondary | ICD-10-CM

## 2011-10-27 DIAGNOSIS — I10 Essential (primary) hypertension: Secondary | ICD-10-CM

## 2011-10-27 DIAGNOSIS — E785 Hyperlipidemia, unspecified: Secondary | ICD-10-CM

## 2011-10-27 DIAGNOSIS — I2510 Atherosclerotic heart disease of native coronary artery without angina pectoris: Secondary | ICD-10-CM

## 2011-10-27 DIAGNOSIS — I6529 Occlusion and stenosis of unspecified carotid artery: Secondary | ICD-10-CM

## 2011-10-27 MED ORDER — LISINOPRIL-HYDROCHLOROTHIAZIDE 20-12.5 MG PO TABS: 1.0000 | ORAL_TABLET | Freq: Every day | ORAL | Status: DC

## 2011-10-27 MED ORDER — SIMVASTATIN 40 MG PO TABS: 40.0000 mg | ORAL_TABLET | Freq: Every day | ORAL | Status: DC

## 2011-10-27 MED ORDER — METOPROLOL SUCCINATE ER 50 MG PO TB24: 75.0000 mg | ORAL_TABLET | Freq: Every day | ORAL | Status: DC

## 2011-10-27 NOTE — Assessment & Plan Note (Signed)
He is without complaint and doing well. I would like to see him lose about 100 lbs and increase his activity.  Instead, we have talked about 20-30 lbs wt loss and walking daily. He verbalizes understanding. I will not plan any cardiac testing at this time as he is asymptomatic. Will see him in 6 month unless he has problems.

## 2011-10-27 NOTE — Assessment & Plan Note (Signed)
I am hearing a left carotid bruit. Will check carotid studies for evaluation.  He will continue aggrenox for now.  Further testing depending on carotid study results.

## 2011-10-27 NOTE — Patient Instructions (Signed)
Your physician recommends that you schedule a follow-up appointment in: 6 months  Your physician has requested that you have a carotid duplex. This test is an ultrasound of the carotid arteries in your neck. It looks at blood flow through these arteries that supply the brain with blood. Allow one hour for this exam. There are no restrictions or special instructions.   Your physician recommends that you return for lab work in: Within the week

## 2011-10-27 NOTE — Assessment & Plan Note (Signed)
Blood pressure is well controlled.  Continue current meds. Will check a BMET for renal fix with lisinopril and HCTZ use.

## 2011-10-27 NOTE — Progress Notes (Signed)
   HPI: [name] is a morbidly obese patient of Dr. Dietrich Pates we are following for going assessment and treatment of CAD and multiple CVRF. He comes today without complaints, remains active and medically compliant. He has not lost any weight since being seen last, but is not gaining wt either. Diabetes is better managed per patient. He states that the metformin dose is being adjusted down per PCP.  No Known Allergies  Current Outpatient Prescriptions  Medication Sig Dispense Refill  . dipyridamole-aspirin (AGGRENOX) 25-200 MG per 12 hr capsule Take 1 capsule by mouth 2 (two) times daily.  30 capsule  0  . lisinopril-hydrochlorothiazide (PRINZIDE,ZESTORETIC) 20-12.5 MG per tablet Take 1 tablet by mouth daily.  90 tablet  3  . metFORMIN (GLUCOPHAGE) 1000 MG tablet Take 1,000 mg by mouth 2 (two) times daily with a meal.       . metoprolol (TOPROL-XL) 50 MG 24 hr tablet Take 1.5 tablets (75 mg total) by mouth daily.  90 tablet  3  . simvastatin (ZOCOR) 40 MG tablet Take 1 tablet (40 mg total) by mouth at bedtime.  90 tablet  3    Past Medical History  Diagnosis Date  . Coronary artery disease   . Hypertension   . Hyperlipidemia   . OSA on CPAP   . Obesity   . Diabetes mellitus   . Diastolic heart failure     Past Surgical History  Procedure Date  . No past surgeries     ROS: Review of systems complete and found to be negative unless listed above PHYSICAL EXAM BP 135/90  Pulse 68  Ht 6\' 1"  (1.854 m)  Wt 270 lb (122.471 kg)  BMI 35.62 kg/m2  General: Well developed, well nourished, in no acute distress. Obese Head: Eyes PERRLA, No xanthomas.   Normal cephalic and atramatic  Lungs: Clear bilaterally to auscultation and percussion. Heart: HRRR S1 S2, systolic murmur at the apex. .  Pulses are 2+ & equal.            + Carotid bruit on the left.Marland Kitchen No JVD.  No abdominal bruits. No femoral bruits. Abdomen: Bowel sounds are positive, abdomen soft and non-tender without masses or                Hernia's noted. Msk:  Back normal, normal gait. Normal strength and tone for age. Extremities: No clubbing, cyanosis or edema.  DP +1 Neuro: Alert and oriented X 3. Psych:  Good affect, responds appropriately    ASSESSMENT AND PLAN

## 2011-10-27 NOTE — Assessment & Plan Note (Signed)
Fasting lipids and LFT's will be completed for follow-up and ongoing assessment. Medication adjustments as necessary.

## 2011-11-04 ENCOUNTER — Other Ambulatory Visit: Payer: Self-pay

## 2011-11-04 DIAGNOSIS — I10 Essential (primary) hypertension: Secondary | ICD-10-CM

## 2011-11-04 LAB — HEPATIC FUNCTION PANEL
ALT: 9 U/L (ref 0–53)
Albumin: 4 g/dL (ref 3.5–5.2)
Alkaline Phosphatase: 49 U/L (ref 39–117)
Total Protein: 7.2 g/dL (ref 6.0–8.3)

## 2011-11-04 LAB — BASIC METABOLIC PANEL
Chloride: 108 mEq/L (ref 96–112)
Creat: 1.23 mg/dL (ref 0.50–1.35)
Potassium: 5.5 mEq/L — ABNORMAL HIGH (ref 3.5–5.3)

## 2011-11-10 ENCOUNTER — Ambulatory Visit (HOSPITAL_COMMUNITY)
Admission: RE | Admit: 2011-11-10 | Discharge: 2011-11-10 | Disposition: A | Discharge status: Home / Self Care | Payer: Medicaid Other | Source: Ambulatory Visit | Attending: Adult Health | Admitting: Adult Health

## 2011-11-10 ENCOUNTER — Other Ambulatory Visit (HOSPITAL_COMMUNITY): Payer: Medicaid Other

## 2011-11-10 DIAGNOSIS — R0989 Other specified symptoms and signs involving the circulatory and respiratory systems: Secondary | ICD-10-CM

## 2011-11-11 LAB — BASIC METABOLIC PANEL
Calcium: 9.6 mg/dL (ref 8.4–10.5)
Creat: 1.28 mg/dL (ref 0.50–1.35)
Sodium: 140 mEq/L (ref 135–145)

## 2012-05-24 ENCOUNTER — Other Ambulatory Visit: Payer: Self-pay | Admitting: Cardiology

## 2012-05-24 DIAGNOSIS — R0989 Other specified symptoms and signs involving the circulatory and respiratory systems: Secondary | ICD-10-CM

## 2012-05-25 ENCOUNTER — Ambulatory Visit: Payer: Medicaid Other | Admitting: Adult Health

## 2012-06-01 ENCOUNTER — Ambulatory Visit (HOSPITAL_COMMUNITY)
Admission: RE | Admit: 2012-06-01 | Discharge: 2012-06-01 | Disposition: A | Discharge status: Home / Self Care | Payer: Medicaid Other | Source: Ambulatory Visit | Attending: Cardiology | Admitting: Cardiology

## 2012-06-01 DIAGNOSIS — E119 Type 2 diabetes mellitus without complications: Secondary | ICD-10-CM | POA: Insufficient documentation

## 2012-06-01 DIAGNOSIS — I10 Essential (primary) hypertension: Secondary | ICD-10-CM | POA: Insufficient documentation

## 2012-06-01 DIAGNOSIS — R0989 Other specified symptoms and signs involving the circulatory and respiratory systems: Secondary | ICD-10-CM | POA: Insufficient documentation

## 2012-06-01 DIAGNOSIS — F17200 Nicotine dependence, unspecified, uncomplicated: Secondary | ICD-10-CM | POA: Insufficient documentation

## 2012-06-02 ENCOUNTER — Encounter: Payer: Self-pay | Admitting: Cardiology

## 2012-06-05 ENCOUNTER — Ambulatory Visit (INDEPENDENT_AMBULATORY_CARE_PROVIDER_SITE_OTHER): Payer: Medicaid Other | Admitting: Adult Health

## 2012-06-05 ENCOUNTER — Encounter: Payer: Self-pay | Admitting: Adult Health

## 2012-06-05 VITALS — BP 110/75 | HR 64 | Ht 73.0 in | Wt 270.5 lb

## 2012-06-05 DIAGNOSIS — I2510 Atherosclerotic heart disease of native coronary artery without angina pectoris: Secondary | ICD-10-CM

## 2012-06-05 DIAGNOSIS — E663 Overweight: Secondary | ICD-10-CM

## 2012-06-05 DIAGNOSIS — F17200 Nicotine dependence, unspecified, uncomplicated: Secondary | ICD-10-CM

## 2012-06-05 DIAGNOSIS — Z720 Tobacco use: Secondary | ICD-10-CM

## 2012-06-05 DIAGNOSIS — I10 Essential (primary) hypertension: Secondary | ICD-10-CM

## 2012-06-05 DIAGNOSIS — I679 Cerebrovascular disease, unspecified: Secondary | ICD-10-CM

## 2012-06-05 NOTE — Assessment & Plan Note (Signed)
Blood pressure is well-controlled currently. We will continue him on medications as directed. He is followed by his PCP for labs. He sees Dr. Concepcion Elk. He states that his blood glucose and blood pressure has been doing well. He offers no complaints. We will see him again in 6 months.

## 2012-06-05 NOTE — Patient Instructions (Addendum)
Your physician recommends that you schedule a follow-up appointment in: 6 months  STOP smoking and walk daily

## 2012-06-05 NOTE — Assessment & Plan Note (Signed)
He continues to be morbidly obese. He has not gained or lost any weight since being seen last. He is very inactive. I have not addressed this with him and ask him to become more active and to decrease his calorie intake. He verbalizes understanding.

## 2012-06-05 NOTE — Progress Notes (Signed)
   HPI: [name] is a morbidly obese 56 year old patient of Dr. Dietrich Pates that we are following for ongoing assessment and treatment of CAD with multiple cardiovascular risk factors. He has not been seen for 6 months. He has not had any hospitalizations. On last visit carotid ultrasound was completed secondary to bilateral carotid bruits. The patient has not lost any weight, but states that he is unable to do so secondary to chronic pain in his right foot. He is followed by primary care for diabetic management. He offers no complaints of shortness of breath chest discomfort or near syncope. He continues to eat cholesterol rich foods, and has begun to smoke again.  No Known Allergies  Current Outpatient Prescriptions  Medication Sig Dispense Refill  . dipyridamole-aspirin (AGGRENOX) 25-200 MG per 12 hr capsule Take 1 capsule by mouth 2 (two) times daily.  30 capsule  0  . lisinopril-hydrochlorothiazide (PRINZIDE,ZESTORETIC) 20-12.5 MG per tablet Take 1 tablet by mouth daily.  90 tablet  3  . metFORMIN (GLUCOPHAGE) 1000 MG tablet Take 1,000 mg by mouth 2 (two) times daily with a meal.       . metoprolol (TOPROL-XL) 50 MG 24 hr tablet Take 1.5 tablets (75 mg total) by mouth daily.  90 tablet  3  . simvastatin (ZOCOR) 40 MG tablet Take 1 tablet (40 mg total) by mouth at bedtime.  90 tablet  3  . fluticasone (FLONASE) 50 MCG/ACT nasal spray       . HYDROcodone-acetaminophen (VICODIN) 5-500 MG per tablet       . QC LORATADINE ALLERGY RELIEF 10 MG tablet         Past Medical History  Diagnosis Date  . Coronary artery disease   . Hypertension   . Hyperlipidemia   . OSA on CPAP   . Obesity   . Diabetes mellitus   . Diastolic heart failure     Past Surgical History  Procedure Date  . No past surgeries     ZOX:WRUEAV of systems complete and found to be negative unless listed above  PHYSICAL EXAM BP 110/75  Pulse 64  Ht 6\' 1"  (1.854 m)  Wt 270 lb 8 oz (122.698 kg)  BMI 35.69  kg/m2  General: Well developed, well nourished, in no acute distress, obese Head: Eyes PERRLA, No xanthomas.   Normal cephalic and atramatic  Lungs: Clear bilaterally to auscultation and percussion. Heart: HRRR S1 S2, 1/6 systolic murmur.   Pulses are 2+ & equal.           Bilateral  carotid bruit. No JVD.  No abdominal bruits. No femoral bruits. Abdomen: Bowel sounds are positive, abdomen soft and non-tender without masses or                  Hernia's noted. Msk:  Back normal, normal gait. Normal strength and tone for age. Extremities: No clubbing, cyanosis or edema.  DP +1. Support hose to right leg. Neuro: Alert and oriented X 3. Psych:  Good affect, responds appropriately  EKG: NSR with LAE, RBBB, with inferior Q-waves noted. Rate of 64 bpm.  ASSESSMENT AND PLAN

## 2012-06-05 NOTE — Assessment & Plan Note (Signed)
He has unfortunately returned to tobacco abuse. I have counseled him again on this. I have spoken with him about a high likelihood of recurrent MI should he continue to smoke. He verbalizes understanding.

## 2012-06-05 NOTE — Assessment & Plan Note (Signed)
No recurrent symptoms of chest discomfort. I have counseled him on risk modification. Is my hopes that he will begin to have some lifestyle changes which will reduce his risk of recurrent MI. We have discussed smoking cessation, exercise, and weight loss. He will continue on current medications and statin. Labs are completed primary care physician.

## 2012-06-05 NOTE — Assessment & Plan Note (Signed)
Review of carotid ultrasound revealed bilateral atherosclerosis not resulting in hemodynamically significant stenosis. We will continue to follow.

## 2012-08-07 ENCOUNTER — Other Ambulatory Visit: Payer: Self-pay | Admitting: Adult Health

## 2012-11-19 ENCOUNTER — Other Ambulatory Visit: Payer: Self-pay | Admitting: Adult Health

## 2012-12-03 ENCOUNTER — Encounter: Payer: Self-pay | Admitting: Adult Health

## 2012-12-03 ENCOUNTER — Ambulatory Visit (INDEPENDENT_AMBULATORY_CARE_PROVIDER_SITE_OTHER): Payer: Medicaid Other | Admitting: Adult Health

## 2012-12-03 VITALS — BP 123/76 | HR 71 | Ht 73.0 in | Wt 279.0 lb

## 2012-12-03 DIAGNOSIS — I10 Essential (primary) hypertension: Secondary | ICD-10-CM

## 2012-12-03 DIAGNOSIS — I2510 Atherosclerotic heart disease of native coronary artery without angina pectoris: Secondary | ICD-10-CM

## 2012-12-03 DIAGNOSIS — Z720 Tobacco use: Secondary | ICD-10-CM

## 2012-12-03 DIAGNOSIS — E785 Hyperlipidemia, unspecified: Secondary | ICD-10-CM

## 2012-12-03 DIAGNOSIS — F17200 Nicotine dependence, unspecified, uncomplicated: Secondary | ICD-10-CM

## 2012-12-03 MED ORDER — LISINOPRIL-HYDROCHLOROTHIAZIDE 20-12.5 MG PO TABS: 1.0000 | ORAL_TABLET | Freq: Every day | ORAL | Status: DC

## 2012-12-03 MED ORDER — METOPROLOL SUCCINATE ER 50 MG PO TB24: 50.0000 mg | ORAL_TABLET | Freq: Every day | ORAL | Status: DC

## 2012-12-03 MED ORDER — SIMVASTATIN 40 MG PO TABS: 40.0000 mg | ORAL_TABLET | Freq: Every day | ORAL | Status: DC

## 2012-12-03 NOTE — Assessment & Plan Note (Signed)
He states he only smokes 1-2 cigarettes a week now. Does not buy them but "bums them" because they cost too much and he is scared of recurrent MI, per our counseling on last office visit. I am congratulated him on his efforts to quit, and have encouraged him to stop completely.

## 2012-12-03 NOTE — Assessment & Plan Note (Signed)
No recent symptoms of angina. He will continue on BB and ASA with risk reduction strategies and lifestyle modifications.

## 2012-12-03 NOTE — Patient Instructions (Addendum)
Your physician recommends that you schedule a follow-up appointment in: 6 MONTHS WITH KL  Your physician recommends that you return for lab work in: THIS WEEK (LIPIDS, LFT'S) SLIPS GIVEN

## 2012-12-03 NOTE — Progress Notes (Signed)
   HPI: [name] is a morbidly obese 57 y/o patient of Dr.Rothbart we are seeing for ongoing assessment and treatment of CAD with multiple CVRF to include hypertension, diabetes, hypercholesterolemia and ongoing tobacco abuse. He is without complaint today for chest pain, DOE, or weakness. He complains of numbness in his feet with burning. He has not had any ER visits or hospitalizations since last office visit 6 months ago.   No Known Allergies  Current Outpatient Prescriptions  Medication Sig Dispense Refill  . ergocalciferol (VITAMIN D2) 50000 UNITS capsule Take 50,000 Units by mouth once a week.      . fluticasone (FLONASE) 50 MCG/ACT nasal spray       . HYDROcodone-acetaminophen (VICODIN) 5-500 MG per tablet       . lisinopril-hydrochlorothiazide (PRINZIDE,ZESTORETIC) 20-12.5 MG per tablet TAKE 1 TABLET BY MOUTH ONCE DAILY.  30 tablet  6  . metFORMIN (GLUCOPHAGE) 1000 MG tablet Take 1,000 mg by mouth 2 (two) times daily with a meal.       . QC LORATADINE ALLERGY RELIEF 10 MG tablet       . simvastatin (ZOCOR) 40 MG tablet Take 1 tablet (40 mg total) by mouth at bedtime.  90 tablet  3  . TOPROL XL 50 MG 24 hr tablet TAKE 1 AND 1/2 TABLETS BY MOUTH DAILY  45 tablet  6    Past Medical History  Diagnosis Date  . Coronary artery disease   . Hypertension   . Hyperlipidemia   . OSA on CPAP   . Obesity   . Diabetes mellitus   . Diastolic heart failure     Past Surgical History  Procedure Date  . No past surgeries     WUJ:WJXBJY of systems complete and found to be negative unless listed above  PHYSICAL EXAM BP 123/76  Pulse 71  Ht 6\' 1"  (1.854 m)  Wt 279 lb (126.554 kg)  BMI 36.81 kg/m2  General: Well developed, well nourished, in no acute distress Head: Eyes PERRLA, No xanthomas.   Normal cephalic and atramatic  Lungs: Clear bilaterally to auscultation and percussion. Heart: HRRR S1 S2, with soft 1/6 systolic murmur.  Pulses are 2+ & equal.            Soft bilateral  carotid bruits. No JVD.  No abdominal bruits. No femoral bruits. Abdomen: Bowel sounds are positive, abdomen soft and non-tender without masses or                  Hernia's noted. Msk:  Back normal, normal gait. Normal strength and tone for age. Extremities: No clubbing, cyanosis or edema.  DP +1 Neuro: Alert and oriented X 3. Psych:  Good affect, responds appropriately  NWG:NFAOZ rhythm with RBBB. Nonspecific infero/lateral T-wave abnormalities.  ASSESSMENT AND PLAN

## 2012-12-03 NOTE — Progress Notes (Deleted)
Name: [name]    DOB: [birth year]  Age: 57 y.o.  MR#: [id number]       PCP:  Dorrene German, MD      Insurance: @PAYORNAME @   CC:   No chief complaint on file.   VS BP 123/76  Pulse 71  Ht 6\' 1"  (1.854 m)  Wt 279 lb (126.554 kg)  BMI 36.81 kg/m2  Weights Current Weight  12/03/12 279 lb (126.554 kg)  06/05/12 270 lb 8 oz (122.698 kg)  10/27/11 270 lb (122.471 kg)    Blood Pressure  BP Readings from Last 3 Encounters:  12/03/12 123/76  06/05/12 110/75  10/27/11 135/90     Admit date:  (Not on file) Last encounter with RMR:  11/19/2012   Allergy No Known Allergies  Current Outpatient Prescriptions  Medication Sig Dispense Refill  . ergocalciferol (VITAMIN D2) 50000 UNITS capsule Take 50,000 Units by mouth once a week.      . fluticasone (FLONASE) 50 MCG/ACT nasal spray       . HYDROcodone-acetaminophen (VICODIN) 5-500 MG per tablet       . lisinopril-hydrochlorothiazide (PRINZIDE,ZESTORETIC) 20-12.5 MG per tablet TAKE 1 TABLET BY MOUTH ONCE DAILY.  30 tablet  6  . metFORMIN (GLUCOPHAGE) 1000 MG tablet Take 1,000 mg by mouth 2 (two) times daily with a meal.       . QC LORATADINE ALLERGY RELIEF 10 MG tablet       . simvastatin (ZOCOR) 40 MG tablet Take 1 tablet (40 mg total) by mouth at bedtime.  90 tablet  3  . TOPROL XL 50 MG 24 hr tablet TAKE 1 AND 1/2 TABLETS BY MOUTH DAILY  45 tablet  6    Discontinued Meds:   There are no discontinued medications.  Patient Active Problem List  Diagnosis  . DIABETES MELLITUS  . HYPERLIPIDEMIA-MIXED  . OVERWEIGHT/OBESITY  . OBSTRUCTIVE SLEEP APNEA  . HYPERTENSION, UNSPECIFIED  . DIASTOLIC HEART FAILURE, CHRONIC  . ATHEROSCLEROTIC CARDIOVASCULAR DISEASE  . Tobacco abuse  . Hyperkalemia  . TIA (transient ischemic attack)  . Cerebrovascular disease    LABS No visits with results within 3 Month(s) from this visit. Latest known visit with results is:  Orders Only on 11/04/2011  Component Date Value  . Sodium  11/10/2011 140   . Potassium 11/10/2011 5.6*  . Chloride 11/10/2011 109   . CO2 11/10/2011 22   . Glucose, Bld 11/10/2011 110*  . BUN 11/10/2011 33*  . Creat 11/10/2011 1.28   . Calcium 11/10/2011 9.6      Results for this Opt Visit:     Results for orders placed in visit on 11/04/11  BASIC METABOLIC PANEL      Component Value Range   Sodium 140  135 - 145 mEq/L   Potassium 5.6 (*) 3.5 - 5.3 mEq/L   Chloride 109  96 - 112 mEq/L   CO2 22  19 - 32 mEq/L   Glucose, Bld 110 (*) 70 - 99 mg/dL   BUN 33 (*) 6 - 23 mg/dL   Creat 4.09  8.11 - 9.14 mg/dL   Calcium 9.6  8.4 - 78.2 mg/dL    EKG Orders placed in visit on 06/05/12  . EKG 12-LEAD     Prior Assessment and Plan Problem List as of 12/03/2012            Cardiology Problems   HYPERLIPIDEMIA-MIXED   Last Assessment & Plan Note   10/27/2011 Office Visit Signed 10/27/2011  3:50 PM  by Jodelle Gross, NP    Fasting lipids and LFT's will be completed for follow-up and ongoing assessment. Medication adjustments as necessary.    HYPERTENSION, UNSPECIFIED   Last Assessment & Plan Note   06/05/2012 Office Visit Signed 06/05/2012  2:37 PM by Jodelle Gross, NP    Blood pressure is well-controlled currently. We will continue him on medications as directed. He is followed by his PCP for labs. He sees Dr. Concepcion Elk. He states that his blood glucose and blood pressure has been doing well. He offers no complaints. We will see him again in 6 months.    DIASTOLIC HEART FAILURE, CHRONIC   ATHEROSCLEROTIC CARDIOVASCULAR DISEASE   Last Assessment & Plan Note   06/05/2012 Office Visit Signed 06/05/2012  2:40 PM by Jodelle Gross, NP    No recurrent symptoms of chest discomfort. I have counseled him on risk modification. Is my hopes that he will begin to have some lifestyle changes which will reduce his risk of recurrent MI. We have discussed smoking cessation, exercise, and weight loss. He will continue on current medications and statin.  Labs are completed primary care physician.    TIA (transient ischemic attack)   Last Assessment & Plan Note   10/27/2011 Office Visit Signed 10/27/2011  3:52 PM by Jodelle Gross, NP    I am hearing a left carotid bruit. Will check carotid studies for evaluation.  He will continue aggrenox for now.  Further testing depending on carotid study results.    Cerebrovascular disease   Last Assessment & Plan Note   06/05/2012 Office Visit Signed 06/05/2012  2:40 PM by Jodelle Gross, NP    Review of carotid ultrasound revealed bilateral atherosclerosis not resulting in hemodynamically significant stenosis. We will continue to follow.      Other   Tobacco abuse   Last Assessment & Plan Note   06/05/2012 Office Visit Signed 06/05/2012  2:38 PM by Jodelle Gross, NP    He has unfortunately returned to tobacco abuse. I have counseled him again on this. I have spoken with him about a high likelihood of recurrent MI should he continue to smoke. He verbalizes understanding.    DIABETES MELLITUS   OVERWEIGHT/OBESITY   Last Assessment & Plan Note   06/05/2012 Office Visit Signed 06/05/2012  2:38 PM by Jodelle Gross, NP    He continues to be morbidly obese. He has not gained or lost any weight since being seen last. He is very inactive. I have not addressed this with him and ask him to become more active and to decrease his calorie intake. He verbalizes understanding.    OBSTRUCTIVE SLEEP APNEA   Hyperkalemia       Imaging: No results found.   FRS Calculation: Score not calculated. Missing: Total Cholesterol

## 2012-12-03 NOTE — Assessment & Plan Note (Signed)
Excellent control of BP on current medications. Will continue with refills authorized.

## 2012-12-03 NOTE — Assessment & Plan Note (Signed)
Review of records demonstrates that he has not had lipid studies since Sept of 2012. He is followed by [facility] and continues on simvastatin. Will have fasting lipids and LFT's done this week with copy to [facility]. Will will request recent chemistry studies from his office for review. He is advised to do a better job on low cholesterol diet.

## 2012-12-04 NOTE — Addendum Note (Signed)
Addended by: Derry Lory A on: 12/04/2012 11:18 AM   Modules accepted: Orders

## 2012-12-08 LAB — HEPATIC FUNCTION PANEL
Albumin: 3.9 g/dL (ref 3.5–5.2)
Alkaline Phosphatase: 56 U/L (ref 39–117)
Bilirubin, Direct: 0.1 mg/dL (ref 0.0–0.3)
Total Bilirubin: 0.3 mg/dL (ref 0.3–1.2)

## 2012-12-08 LAB — LIPID PANEL
HDL: 30 mg/dL — ABNORMAL LOW (ref >39)
LDL Cholesterol: 65 mg/dL (ref 0–99)
Total CHOL/HDL Ratio: 4.1 Ratio

## 2012-12-10 ENCOUNTER — Telehealth: Payer: Self-pay | Admitting: Adult Health

## 2012-12-10 MED ORDER — METOPROLOL SUCCINATE ER 50 MG PO TB24: ORAL_TABLET | ORAL | Status: DC

## 2012-12-10 NOTE — Addendum Note (Signed)
Addended by: Reather Laurence A on: 12/10/2012 11:16 AM   Modules accepted: Orders

## 2012-12-10 NOTE — Telephone Encounter (Signed)
Patient has been taking 75 mg of Metoprolol and script was sent in for 50 at his last visit.  Advised him that I would update his prescription.

## 2012-12-10 NOTE — Telephone Encounter (Signed)
PT STATES THAT AT LAST WEEKS VISIT NOTHING WAS MENTIONED ABOUT CHANGING DOSE ON METOPROLOL. BUT ON THE PAPER WE SENT HONE WITH HIS ,TO DOSE WAS CHANGED TO ONE PILL INSTEAD OF TWO.

## 2012-12-28 ENCOUNTER — Telehealth: Payer: Self-pay | Admitting: Cardiology

## 2012-12-28 MED ORDER — METOPROLOL SUCCINATE ER 50 MG PO TB24: ORAL_TABLET | ORAL | Status: DC

## 2012-12-28 NOTE — Telephone Encounter (Signed)
Quantity was sent to [facility] for 45 tablets on 12/10/12.  Will send, yet another script for same quantity.  Patient notified.

## 2012-12-28 NOTE — Telephone Encounter (Signed)
Patient called and states that prescription for metoprolol succinate (TOPROL XL) 50 MG 24 hr tablet He just picked up was for qty 30 and should be for qty 45.  He takes 1 1/2 daily. Patient states he is out today.

## 2013-02-18 ENCOUNTER — Telehealth: Payer: Self-pay | Admitting: *Deleted

## 2013-02-18 NOTE — Telephone Encounter (Signed)
Received request for recommendations related to Aggrenox prior to screening colonoscopy, scheduled for 03/18/2013 with Dr Ewing Schlein.  Fax [phone] phone [phone].  Please advise.

## 2013-02-21 ENCOUNTER — Encounter: Payer: Self-pay | Admitting: *Deleted

## 2013-02-21 NOTE — Telephone Encounter (Signed)
Letter of recommendation sent to Dr Ewing Schlein.

## 2013-02-21 NOTE — Telephone Encounter (Signed)
Hold for 5 days prior to procedure 

## 2013-03-18 ENCOUNTER — Other Ambulatory Visit: Payer: Self-pay | Admitting: Gastroenterology

## 2013-06-03 ENCOUNTER — Ambulatory Visit: Payer: Medicaid Other | Admitting: Adult Health

## 2013-06-13 ENCOUNTER — Ambulatory Visit: Payer: Medicaid Other | Admitting: Adult Health

## 2013-06-26 ENCOUNTER — Encounter: Payer: Medicaid Other | Admitting: Adult Health

## 2013-06-26 NOTE — Progress Notes (Signed)
   HPI: [name] is a 57 year old morbidly obese patient of Dr. Dietrich Pates we are following for ongoing assessment and management of CAD with multiple cardiovascular risk factors, including hypertension diabetes hypercholesterolemia and ongoing tobacco abuse. He was last seen in the office in January of 2014 was without complaint. Last visit fasting lipids in about these were completed with copy to his primary care physician. No medications were changed at that time.   Total cholesterol was 123, triglycerides 138, HDL 30, LDL 65, LFTs were within normal limits.  No Known Allergies  Current Outpatient Prescriptions  Medication Sig Dispense Refill  . ergocalciferol (VITAMIN D2) 50000 UNITS capsule Take 50,000 Units by mouth once a week.      . fluticasone (FLONASE) 50 MCG/ACT nasal spray       . HYDROcodone-acetaminophen (VICODIN) 5-500 MG per tablet       . lisinopril-hydrochlorothiazide (PRINZIDE,ZESTORETIC) 20-12.5 MG per tablet Take 1 tablet by mouth daily.  30 tablet  6  . metFORMIN (GLUCOPHAGE) 1000 MG tablet Take 1,000 mg by mouth 2 (two) times daily with a meal.       . metoprolol succinate (TOPROL XL) 50 MG 24 hr tablet Take 1 1/2 tablets daily  45 tablet  6  . QC LORATADINE ALLERGY RELIEF 10 MG tablet       . simvastatin (ZOCOR) 40 MG tablet Take 1 tablet (40 mg total) by mouth at bedtime.  90 tablet  1   No current facility-administered medications for this visit.    Past Medical History  Diagnosis Date  . Coronary artery disease   . Hypertension   . Hyperlipidemia   . OSA on CPAP   . Obesity   . Diabetes mellitus   . Diastolic heart failure     Past Surgical History  Procedure Laterality Date  . No past surgeries      ROS: PHYSICAL EXAM There were no vitals taken for this visit.  EKG:  ASSESSMENT AND PLAN

## 2013-07-01 ENCOUNTER — Ambulatory Visit (INDEPENDENT_AMBULATORY_CARE_PROVIDER_SITE_OTHER): Payer: Medicaid Other | Admitting: Adult Health

## 2013-07-01 ENCOUNTER — Ambulatory Visit: Payer: Medicaid Other | Admitting: Adult Health

## 2013-07-01 ENCOUNTER — Encounter: Payer: Self-pay | Admitting: Adult Health

## 2013-07-01 VITALS — BP 130/82 | HR 60 | Ht 73.0 in | Wt 283.0 lb

## 2013-07-01 DIAGNOSIS — G459 Transient cerebral ischemic attack, unspecified: Secondary | ICD-10-CM

## 2013-07-01 DIAGNOSIS — I2510 Atherosclerotic heart disease of native coronary artery without angina pectoris: Secondary | ICD-10-CM

## 2013-07-01 DIAGNOSIS — I5032 Chronic diastolic (congestive) heart failure: Secondary | ICD-10-CM

## 2013-07-01 DIAGNOSIS — E785 Hyperlipidemia, unspecified: Secondary | ICD-10-CM

## 2013-07-01 DIAGNOSIS — I10 Essential (primary) hypertension: Secondary | ICD-10-CM

## 2013-07-01 NOTE — Progress Notes (Signed)
   HPI: Mr. Baer is a 56 y/o obese patient of Dr. Dietrich Pates we are seeing for ongoing assessment and treatment of CAD, multiple CVRF to include hypertension, diabetes, hypercholesterolemia and ongoing tobacco abuse. He comes today without complaints of fatigue, chest discomfort or fluid retention. He has some dyspnea, especially when he bends over to tie his shoes. He has not had any hospitalizations or ER visits over the last 6 months.  No Known Allergies  Current Outpatient Prescriptions  Medication Sig Dispense Refill  . HYDROcodone-acetaminophen (VICODIN) 5-500 MG per tablet Take 1 tablet by mouth every 4 (four) hours as needed.       Marland Kitchen lisinopril-hydrochlorothiazide (PRINZIDE,ZESTORETIC) 20-12.5 MG per tablet Take 1 tablet by mouth daily.  30 tablet  6  . metFORMIN (GLUCOPHAGE) 1000 MG tablet Take 1,000 mg by mouth 2 (two) times daily with a meal.       . metoprolol succinate (TOPROL XL) 50 MG 24 hr tablet Take 1 1/2 tablets daily  45 tablet  6  . QC LORATADINE ALLERGY RELIEF 10 MG tablet Take 10 mg by mouth daily.       . simvastatin (ZOCOR) 40 MG tablet Take 1 tablet (40 mg total) by mouth at bedtime.  90 tablet  1   No current facility-administered medications for this visit.    Past Medical History  Diagnosis Date  . Coronary artery disease   . Hypertension   . Hyperlipidemia   . OSA on CPAP   . Obesity   . Diabetes mellitus   . Diastolic heart failure     Past Surgical History  Procedure Laterality Date  . No past surgeries      ROS: Review of systems complete and found to be negative unless listed above  PHYSICAL EXAM BP 130/82  Pulse 60  Ht 6\' 1"  (1.854 m)  Wt 283 lb (128.368 kg)  BMI 37.35 kg/m2  SpO2 98%  General: Well developed, well nourished, in no acute distress Head: Eyes PERRLA, No xanthomas.   Normal cephalic and atramatic  Lungs: Clear bilaterally to auscultation and percussion. Heart: HRRR S1 S2, without MRG.  Pulses are 2+ & equal.  No carotid bruit. No JVD.  No abdominal bruits. No femoral bruits. Abdomen: Bowel sounds are positive, abdomen soft and non-tender without masses or                  Hernia's noted. Msk:  Back normal, normal gait. Normal strength and tone for age. Extremities: No clubbing, cyanosis or edema.  DP +1 Neuro: Alert and oriented X 3. Psych:  Good affect, responds appropriately  EKG: NSR  ASSESSMENT AND PLAN

## 2013-07-01 NOTE — Assessment & Plan Note (Signed)
No evidence of fluid retention or CHF decompensation. Continue medications. Will check BMET as he is on ACE with HCTZ. He is reminded to avoid salt.

## 2013-07-01 NOTE — Assessment & Plan Note (Signed)
Fasting lipids and LFT's will be drawn.

## 2013-07-01 NOTE — Assessment & Plan Note (Signed)
He is without cardiac complaint. Will focus on risk management at this time.Continue ACE inhibitor, statin, and BB. No stress myoviews have been completed since CABG in  2009. Will proceed with this should he become symptomatic.

## 2013-07-01 NOTE — Patient Instructions (Addendum)
Your physician recommends that you schedule a follow-up appointment in: 6 months You will receive a reminder letter two months in advance reminding you to call and schedule your appointment. If you don't receive this letter, please contact our office.  Your physician recommends that you continue on your current medications as directed. Please refer to the Current Medication list given to you today.  Your physician recommends that you return for lab work this week: Fasting LIPIDS, LFTS, BMET, CBC, A1C

## 2013-07-01 NOTE — Progress Notes (Deleted)
Name: [name]    DOB: 12/29/1955  Age: 57 y.o.  MR#: [id number]       PCP:  Dorrene German, MD      Insurance: Payor: MEDICAID [place] / Plan: MEDICAID [place] ACCESS / Product Type: *No Product type* /   CC:   No chief complaint on file.   VS Filed Vitals:   07/01/13 1320  BP: 130/82  Pulse: 60  Height: 6\' 1"  (1.854 m)  Weight: 283 lb (128.368 kg)  SpO2: 98%    Weights Current Weight  07/01/13 283 lb (128.368 kg)  12/03/12 279 lb (126.554 kg)  06/05/12 270 lb 8 oz (122.698 kg)    Blood Pressure  BP Readings from Last 3 Encounters:  07/01/13 130/82  12/03/12 123/76  06/05/12 110/75     Admit date:  (Not on file) Last encounter with RMR:  Visit date not found   Allergy Review of patient's allergies indicates no known allergies.  Current Outpatient Prescriptions  Medication Sig Dispense Refill  . HYDROcodone-acetaminophen (VICODIN) 5-500 MG per tablet Take 1 tablet by mouth every 4 (four) hours as needed.       Marland Kitchen lisinopril-hydrochlorothiazide (PRINZIDE,ZESTORETIC) 20-12.5 MG per tablet Take 1 tablet by mouth daily.  30 tablet  6  . metFORMIN (GLUCOPHAGE) 1000 MG tablet Take 1,000 mg by mouth 2 (two) times daily with a meal.       . metoprolol succinate (TOPROL XL) 50 MG 24 hr tablet Take 1 1/2 tablets daily  45 tablet  6  . QC LORATADINE ALLERGY RELIEF 10 MG tablet Take 10 mg by mouth daily.       . simvastatin (ZOCOR) 40 MG tablet Take 1 tablet (40 mg total) by mouth at bedtime.  90 tablet  1   No current facility-administered medications for this visit.    Discontinued Meds:    Medications Discontinued During This Encounter  Medication Reason  . fluticasone (FLONASE) 50 MCG/ACT nasal spray Error  . ergocalciferol (VITAMIN D2) 50000 UNITS capsule Error    Patient Active Problem List   Diagnosis Date Noted  . TIA (transient ischemic attack) 07/13/2011  . Cerebrovascular disease 07/13/2011  . Tobacco abuse 07/12/2011  . Hyperkalemia 07/12/2011  .  ATHEROSCLEROTIC CARDIOVASCULAR DISEASE 08/21/2009  . DIABETES MELLITUS 05/12/2009  . HYPERLIPIDEMIA-MIXED 05/12/2009  . OVERWEIGHT/OBESITY 05/12/2009  . OBSTRUCTIVE SLEEP APNEA 05/12/2009  . HYPERTENSION, UNSPECIFIED 05/12/2009  . DIASTOLIC HEART FAILURE, CHRONIC 05/12/2009    LABS    Component Value Date/Time   NA 140 11/10/2011 1149   NA 139 11/03/2011 1020   NA 136 07/13/2011 0522   K 5.6* 11/10/2011 1149   K 5.5* 11/03/2011 1020   K 5.0 07/13/2011 0522   CL 109 11/10/2011 1149   CL 108 11/03/2011 1020   CL 106 07/13/2011 0522   CO2 22 11/10/2011 1149   CO2 22 11/03/2011 1020   CO2 26 07/13/2011 0522   GLUCOSE 110* 11/10/2011 1149   GLUCOSE 120* 11/03/2011 1020   GLUCOSE 115* 07/13/2011 0522   BUN 33* 11/10/2011 1149   BUN 31* 11/03/2011 1020   BUN 19 07/13/2011 0522   CREATININE 1.28 11/10/2011 1149   CREATININE 1.23 11/03/2011 1020   CREATININE 1.05 07/13/2011 0522   CREATININE 1.40* 07/12/2011 0627   CREATININE 1.27 07/12/2011 0611   CALCIUM 9.6 11/10/2011 1149   CALCIUM 9.3 11/03/2011 1020   CALCIUM 9.3 07/13/2011 0522   GFRNONAA >60 07/13/2011 0522   GFRNONAA 59* 07/12/2011 0611   GFRNONAA >60  08/20/2010 1501   GFRAA >60 07/13/2011 0522   GFRAA >60 07/12/2011 0611   GFRAA  Value: >60        The eGFR has been calculated using the MDRD equation. This calculation has not been validated in all clinical situations. eGFR's persistently <60 mL/min signify possible Chronic Kidney Disease. 08/20/2010 1501   CMP     Component Value Date/Time   NA 140 11/10/2011 1149   K 5.6* 11/10/2011 1149   CL 109 11/10/2011 1149   CO2 22 11/10/2011 1149   GLUCOSE 110* 11/10/2011 1149   BUN 33* 11/10/2011 1149   CREATININE 1.28 11/10/2011 1149   CREATININE 1.05 07/13/2011 0522   CALCIUM 9.6 11/10/2011 1149   PROT 6.8 12/03/2012 1338   ALBUMIN 3.9 12/03/2012 1338   AST 11 12/03/2012 1338   ALT <8 12/03/2012 1338   ALKPHOS 56 12/03/2012 1338   BILITOT 0.3 12/03/2012 1338   GFRNONAA >60 07/13/2011 0522   GFRAA >60 07/13/2011 0522        Component Value Date/Time   WBC 6.0 07/12/2011 0611   WBC 8.8 08/20/2010 1501   WBC 7.9 04/12/2010 1825   HGB 13.3 07/12/2011 0627   HGB 12.5* 07/12/2011 0611   HGB 13.6 08/20/2010 1501   HCT 39.0 07/12/2011 0627   HCT 37.7* 07/12/2011 0611   HCT 42.1 08/20/2010 1501   MCV 86.7 07/12/2011 0611   MCV 83.4 08/20/2010 1501   MCV 85.4 04/12/2010 1825    Lipid Panel     Component Value Date/Time   CHOL 123 12/03/2012 1338   TRIG 138 12/03/2012 1338   HDL 30* 12/03/2012 1338   CHOLHDL 4.1 12/03/2012 1338   VLDL 28 12/03/2012 1338   LDLCALC 65 12/03/2012 1338    ABG    Component Value Date/Time   PHART 7.336* 04/18/2008 1737   PCO2ART 43.7 04/18/2008 1737   PO2ART 58.0* 04/18/2008 1737   HCO3 23.5 04/18/2008 1737   TCO2 21 07/12/2011 0627   ACIDBASEDEF 2.0 04/18/2008 1737   O2SAT 89.0 04/18/2008 1737     Lab Results  Component Value Date   TSH 1.029 08/21/2009   BNP (last 3 results) No results found for this basename: PROBNP,  in the last 8760 hours Cardiac Panel (last 3 results) No results found for this basename: CKTOTAL, CKMB, TROPONINI, RELINDX,  in the last 72 hours  Iron/TIBC/Ferritin    Component Value Date/Time   IRON 24* 05/03/2008 0555   TIBC 239 05/03/2008 0555   FERRITIN 340* 05/03/2008 0555     EKG Orders placed in visit on 07/01/13  . EKG 12-LEAD     Prior Assessment and Plan Problem List as of 07/01/2013     Cardiovascular and Mediastinum   HYPERTENSION, UNSPECIFIED   Last Assessment & Plan   12/03/2012 Office Visit Written 12/03/2012  1:42 PM by Jodelle Gross, NP     Excellent control of BP on current medications. Will continue with refills authorized.    DIASTOLIC HEART FAILURE, CHRONIC   ATHEROSCLEROTIC CARDIOVASCULAR DISEASE   Last Assessment & Plan   12/03/2012 Office Visit Written 12/03/2012  1:44 PM by Jodelle Gross, NP     No recent symptoms of angina. He will continue on BB and ASA with risk reduction strategies and lifestyle modifications.    TIA  (transient ischemic attack)   Last Assessment & Plan   10/27/2011 Office Visit Written 10/27/2011  3:52 PM by Jodelle Gross, NP     I am hearing  a left carotid bruit. Will check carotid studies for evaluation.  He will continue aggrenox for now.  Further testing depending on carotid study results.    Cerebrovascular disease   Last Assessment & Plan   06/05/2012 Office Visit Written 06/05/2012  2:40 PM by Jodelle Gross, NP     Review of carotid ultrasound revealed bilateral atherosclerosis not resulting in hemodynamically significant stenosis. We will continue to follow.      Respiratory   OBSTRUCTIVE SLEEP APNEA     Endocrine   DIABETES MELLITUS     Other   Tobacco abuse   Last Assessment & Plan   12/03/2012 Office Visit Written 12/03/2012  1:45 PM by Jodelle Gross, NP     He states he only smokes 1-2 cigarettes a week now. Does not buy them but "bums them" because they cost too much and he is scared of recurrent MI, per our counseling on last office visit. I am congratulated him on his efforts to quit, and have encouraged him to stop completely.    HYPERLIPIDEMIA-MIXED   Last Assessment & Plan   12/03/2012 Office Visit Written 12/03/2012  1:42 PM by Jodelle Gross, NP     Review of records demonstrates that he has not had lipid studies since Sept of 2012. He is followed by [facility] and continues on simvastatin. Will have fasting lipids and LFT's done this week with copy to [facility]. Will will request recent chemistry studies from his office for review. He is advised to do a better job on low cholesterol diet.     OVERWEIGHT/OBESITY   Last Assessment & Plan   06/05/2012 Office Visit Written 06/05/2012  2:38 PM by Jodelle Gross, NP     He continues to be morbidly obese. He has not gained or lost any weight since being seen last. He is very inactive. I have not addressed this with him and ask him to become more active and to decrease his calorie intake. He verbalizes  understanding.    Hyperkalemia       Imaging: No results found.

## 2013-07-10 LAB — HEPATIC FUNCTION PANEL
Albumin: 3.7 g/dL (ref 3.5–5.2)
Total Bilirubin: 0.4 mg/dL (ref 0.3–1.2)

## 2013-07-10 LAB — LIPID PANEL
Cholesterol: 116 mg/dL (ref 0–200)
HDL: 32 mg/dL — ABNORMAL LOW (ref >39)
Total CHOL/HDL Ratio: 3.6 Ratio
VLDL: 38 mg/dL (ref 0–40)

## 2013-07-10 LAB — BASIC METABOLIC PANEL
BUN: 21 mg/dL (ref 6–23)
CO2: 24 mEq/L (ref 19–32)
Chloride: 112 mEq/L (ref 96–112)
Creat: 1.24 mg/dL (ref 0.50–1.35)

## 2013-07-10 LAB — CBC
MCH: 27.9 pg (ref 26.0–34.0)
MCHC: 32.9 g/dL (ref 30.0–36.0)
Platelets: 206 10*3/uL (ref 150–400)
RDW: 15.8 % — ABNORMAL HIGH (ref 11.5–15.5)

## 2013-07-10 LAB — HEMOGLOBIN A1C: Hgb A1c MFr Bld: 7.3 % — ABNORMAL HIGH (ref <5.7)

## 2013-07-11 ENCOUNTER — Encounter: Payer: Self-pay | Admitting: *Deleted

## 2013-07-15 ENCOUNTER — Telehealth: Payer: Self-pay | Admitting: *Deleted

## 2013-07-15 NOTE — Telephone Encounter (Signed)
Spoke to patient concerning lab/test results/instructions from provider. Patient understood.    

## 2013-07-15 NOTE — Telephone Encounter (Signed)
PT HAS QUESTIONS ABOUT LAB RESULTS

## 2013-08-05 ENCOUNTER — Telehealth: Payer: Self-pay | Admitting: Adult Health

## 2013-08-05 MED ORDER — LISINOPRIL-HYDROCHLOROTHIAZIDE 20-12.5 MG PO TABS: 1.0000 | ORAL_TABLET | Freq: Every day | ORAL | Status: DC

## 2013-08-05 NOTE — Telephone Encounter (Signed)
Medication sent via escribe.  

## 2013-08-05 NOTE — Telephone Encounter (Signed)
Needs refill on Lisinopril sent to  [facility]  °

## 2013-08-07 ENCOUNTER — Other Ambulatory Visit: Payer: Self-pay | Admitting: Adult Health

## 2013-11-06 ENCOUNTER — Other Ambulatory Visit: Payer: Self-pay | Admitting: Adult Health

## 2013-11-08 ENCOUNTER — Telehealth: Payer: Self-pay | Admitting: *Deleted

## 2013-11-08 NOTE — Telephone Encounter (Signed)
Pt needs to know if we have took him off simvastatin

## 2013-11-29 NOTE — Telephone Encounter (Signed)
Patient called ,unsure if he was to continue Zocor.Per K.Lawrence NP he should continue.  Has f/u apt with MD on 01/01/14

## 2013-11-29 NOTE — Telephone Encounter (Signed)
This is till in my basket, has it been addressed?

## 2014-01-01 ENCOUNTER — Ambulatory Visit: Payer: Medicaid Other | Admitting: Cardiology

## 2014-01-01 ENCOUNTER — Encounter: Payer: Self-pay | Admitting: Cardiology

## 2014-01-01 ENCOUNTER — Ambulatory Visit (INDEPENDENT_AMBULATORY_CARE_PROVIDER_SITE_OTHER): Payer: Medicaid Other | Admitting: Cardiology

## 2014-01-01 VITALS — BP 144/88 | HR 65 | Ht 73.0 in | Wt 285.0 lb

## 2014-01-01 DIAGNOSIS — I10 Essential (primary) hypertension: Secondary | ICD-10-CM

## 2014-01-01 DIAGNOSIS — E785 Hyperlipidemia, unspecified: Secondary | ICD-10-CM

## 2014-01-01 DIAGNOSIS — I2510 Atherosclerotic heart disease of native coronary artery without angina pectoris: Secondary | ICD-10-CM

## 2014-01-01 MED ORDER — ATORVASTATIN CALCIUM 80 MG PO TABS: 80.0000 mg | ORAL_TABLET | Freq: Every day | ORAL | Status: DC

## 2014-01-01 NOTE — Progress Notes (Signed)
Clinical Summary [name] is a 58 y.o.male former patient of Dr Dietrich Pates, this is our first visit together. He is seen for the following medical problems.  1. 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  - echo 07/2011 LVEF 45-50% with inferior hypokinesis.  -denies any chest pain, no SOB or DOE though sedentary lifestyle due to neuropathy. - compliant with meds  2. HTN - checks bp at home, typically around 120s/70s - compliant with meds, he has DM and is on ACE-I  3. Hyperlipidemia - compliant with zocor - panel 07/2013: TC 116 TG 190 HDL 32 LDL 46  4. CVA - on aggrenox for secondary prevention. Also on statin, ACE-I   Past Medical History  Diagnosis Date  . Coronary artery disease   . Hypertension   . Hyperlipidemia   . OSA on CPAP   . Obesity   . Diabetes mellitus   . Diastolic heart failure      No Known Allergies   Current Outpatient Prescriptions  Medication Sig Dispense Refill  . HYDROcodone-acetaminophen (VICODIN) 5-500 MG per tablet Take 1 tablet by mouth every 4 (four) hours as needed.       Marland Kitchen lisinopril-hydrochlorothiazide (PRINZIDE,ZESTORETIC) 20-12.5 MG per tablet Take 1 tablet by mouth daily.  30 tablet  6  . metFORMIN (GLUCOPHAGE) 1000 MG tablet Take 1,000 mg by mouth 2 (two) times daily with a meal.       . QC LORATADINE ALLERGY RELIEF 10 MG tablet Take 10 mg by mouth daily.       . simvastatin (ZOCOR) 40 MG tablet TAKE (1) TABLET BY MOUTH AT BEDTIME FOR CHOLESTEROL.  90 tablet  6  . TOPROL XL 50 MG 24 hr tablet TAKE 1 AND 1/2 TABLETS BY MOUTH ONCE DAILY.  45 tablet  6   No current facility-administered medications for this visit.     Past Surgical History  Procedure Laterality Date  . No past surgeries       No Known Allergies    Family History  Problem Relation Age of Onset  . Diabetes Father   . Hypertension Father      Social History [name] reports that he has been smoking Cigarettes.  He  has been smoking about 0.00 packs per day for the past 15 years. He has never used smokeless tobacco. [name] reports that he drinks about 2.4 ounces of alcohol per week.   Review of Systems CONSTITUTIONAL: No weight loss, fever, chills, weakness or fatigue.  HEENT: Eyes: No visual loss, blurred vision, double vision or yellow sclerae.No hearing loss, sneezing, congestion, runny nose or sore throat.  SKIN: No rash or itching.  CARDIOVASCULAR: per HPI RESPIRATORY: No shortness of breath, cough or sputum.  GASTROINTESTINAL: No anorexia, nausea, vomiting or diarrhea. No abdominal pain or blood.  GENITOURINARY: No burning on urination, no polyuria NEUROLOGICAL: No headache, dizziness, syncope, paralysis, ataxia, numbness or tingling in the extremities. No change in bowel or bladder control.  MUSCULOSKELETAL: No muscle, back pain, joint pain or stiffness.  LYMPHATICS: No enlarged nodes. No history of splenectomy.  PSYCHIATRIC: No history of depression or anxiety.  ENDOCRINOLOGIC: No reports of sweating, cold or heat intolerance. No polyuria or polydipsia.  Marland Kitchen   Physical Examination p 65 bp 140/80 Wt 285 lbs BMI 38 Gen: resting comfortably, no acute distress HEENT: no scleral icterus, pupils equal round and reactive, no palptable cervical adenopathy,  CV: RRR, no m/r/g, no JVD, no  carotid bruits Resp: Clear to auscultation bilaterally GI: abdomen is soft, non-tender, non-distended, normal bowel sounds, no hepatosplenomegaly MSK: extremities are warm, 1+ bilateral edema Skin: warm, no rash Neuro:  no focal deficits Psych: appropriate affect   Diagnostic Studies 07/2011 Echo LVEF 45-50%, mod biatrial enlargement, diastolic function not described.   05/2012 Carotid US  IMPRESSION: Bilateral atherosclerosis, not resulting in hemodynamically significant stenosis.   Assessment and Plan  1. CAD - no current symptoms, continue risk factor modification - will start ASA 81 mg daily,  he is on aggrenox however that only contains 25mg  of ASA, below the recommended dose for secondary prevention  2. HTN - home numbers are at goal, continue current meds - he will bring a bp log in 1 months  3. Hyperlipidemia - in setting of known CAD, consistent with most recent lipid guidelines will change to high dose statin lipitor 80mg  daily    Follow up 1 year    Antoine PocheJonathan F. Cincere Deprey, M.D., F.A.C.C.

## 2014-01-01 NOTE — Patient Instructions (Signed)
Your physician wants you to follow-up in: ONE YEAR You will receive a reminder letter in the mail two months in advance. If you don't receive a letter, please call our office to schedule the follow-up appointment.  Your physician has recommended you make the following change in your medication:   1) STOP TAKING SIMVASTATIN 2) START TAKING LIPITOR 80MG  ONCE DAILY 3) START ASPIRIN 81MG  ONCE DAILY  Your physician has requested that you regularly monitor and record your blood pressure readings at home. Please use the same machine at the same time of day to check your readings and record them to bring to your follow-up visit.BRING RECORDINGS OF DAILY BLOOD PRESSURES TO OUR OFFICE IN ONE MONTH

## 2014-01-02 ENCOUNTER — Other Ambulatory Visit: Payer: Self-pay | Admitting: Adult Health

## 2014-02-04 ENCOUNTER — Encounter: Payer: Self-pay | Admitting: Cardiology

## 2014-03-09 ENCOUNTER — Emergency Department (HOSPITAL_COMMUNITY): Payer: Medicaid Other

## 2014-03-09 ENCOUNTER — Encounter (HOSPITAL_COMMUNITY): Payer: Self-pay | Admitting: Emergency Medicine

## 2014-03-09 ENCOUNTER — Emergency Department (HOSPITAL_COMMUNITY)
Admission: EM | Admit: 2014-03-09 | Discharge: 2014-03-09 | Disposition: A | Discharge status: Home / Self Care | Payer: Medicaid Other | Attending: Emergency Medicine | Admitting: Emergency Medicine

## 2014-03-09 DIAGNOSIS — Z79899 Other long term (current) drug therapy: Secondary | ICD-10-CM | POA: Insufficient documentation

## 2014-03-09 DIAGNOSIS — I5030 Unspecified diastolic (congestive) heart failure: Secondary | ICD-10-CM | POA: Insufficient documentation

## 2014-03-09 DIAGNOSIS — R609 Edema, unspecified: Secondary | ICD-10-CM | POA: Insufficient documentation

## 2014-03-09 DIAGNOSIS — F17200 Nicotine dependence, unspecified, uncomplicated: Secondary | ICD-10-CM | POA: Insufficient documentation

## 2014-03-09 DIAGNOSIS — J069 Acute upper respiratory infection, unspecified: Secondary | ICD-10-CM

## 2014-03-09 DIAGNOSIS — I10 Essential (primary) hypertension: Secondary | ICD-10-CM | POA: Insufficient documentation

## 2014-03-09 DIAGNOSIS — G4733 Obstructive sleep apnea (adult) (pediatric): Secondary | ICD-10-CM | POA: Insufficient documentation

## 2014-03-09 DIAGNOSIS — I2510 Atherosclerotic heart disease of native coronary artery without angina pectoris: Secondary | ICD-10-CM | POA: Insufficient documentation

## 2014-03-09 DIAGNOSIS — E785 Hyperlipidemia, unspecified: Secondary | ICD-10-CM | POA: Insufficient documentation

## 2014-03-09 DIAGNOSIS — E669 Obesity, unspecified: Secondary | ICD-10-CM | POA: Insufficient documentation

## 2014-03-09 DIAGNOSIS — Z7982 Long term (current) use of aspirin: Secondary | ICD-10-CM | POA: Insufficient documentation

## 2014-03-09 DIAGNOSIS — E119 Type 2 diabetes mellitus without complications: Secondary | ICD-10-CM | POA: Insufficient documentation

## 2014-03-09 DIAGNOSIS — Z9981 Dependence on supplemental oxygen: Secondary | ICD-10-CM | POA: Insufficient documentation

## 2014-03-09 MED ORDER — AMOXICILLIN-POT CLAVULANATE ER 1000-62.5 MG PO TB12
2.0000 | ORAL_TABLET | Freq: Two times a day (BID) | ORAL | Status: DC
Start: 2014-03-09 — End: 2016-02-10

## 2014-03-09 NOTE — ED Notes (Signed)
Pt received discharge instructions and prescriptions, verbalized understanding and has no further questions. Pt ambulated to exit in stable condition.   

## 2014-03-09 NOTE — ED Provider Notes (Signed)
CSN: [id number]     Arrival date & time 03/09/14  1032 History  This chart was scribed for American Expressathan R. Rubin PayorPickering, MD by Quintella ReichertMatthew Underwood, ED scribe.  This patient was seen in room APA11/[facility]11 and the patient's care was started at 11:57 AM.   Chief Complaint  Patient presents with  . Cough  . Shortness of Breath    The history is provided by the patient. No language interpreter was used.    HPI Comments: [name] is a 58 y.o. male with h/o diastolic heart failure, OSA on CPAP, DM, obesity, HTN and hyperlipidemia who presents to the Emergency Department complaining of 3 weeks of persistent cough with associated chest congestion and SOB.  Pt states cough is intermittently productive of a small amount of brown sputum.  He denies associated CP, new leg swelling, back pain, or fever.  He has attempted to treat symptoms with Robitussin DM, without relief.  He went to his PCP 6 days ago and was given an inhaler, which he has been using with some relief.  Pt quit smoking one month ago and developed his current symptoms shortly after.   Past Medical History  Diagnosis Date  . Coronary artery disease   . Hypertension   . Hyperlipidemia   . OSA on CPAP   . Obesity   . Diabetes mellitus   . Diastolic heart failure     Past Surgical History  Procedure Laterality Date  . No past surgeries      Family History  Problem Relation Age of Onset  . Diabetes Father   . Hypertension Father     History  Substance Use Topics  . Smoking status: Current Some Day Smoker -- 15 years    Types: Cigarettes  . Smokeless tobacco: Never Used     Comment: Down to a cigarette per week  . Alcohol Use: 2.4 oz/week    2 Cans of beer, 2 Shots of liquor per week     Comment: occ     Review of Systems  Constitutional: Negative for fever.  HENT: Positive for congestion.   Respiratory: Positive for cough and shortness of breath.   Cardiovascular: Positive for leg swelling (chronic, unchanged). Negative  for chest pain.  Musculoskeletal: Negative for back pain.  All other systems reviewed and are negative.     Allergies  Review of patient's allergies indicates no known allergies.  Home Medications   Prior to Admission medications   Medication Sig Start Date End Date Taking? Authorizing Provider  atorvastatin (LIPITOR) 80 MG tablet Take 1 tablet (80 mg total) by mouth daily. 01/01/14   Antoine PocheJonathan F Branch, MD  dipyridamole-aspirin (AGGRENOX) 200-25 MG per 12 hr capsule Take 1 capsule by mouth 2 (two) times daily.    Historical Provider, MD  gabapentin (NEURONTIN) 300 MG capsule Take 300 mg by mouth 3 (three) times daily.    Historical Provider, MD  lisinopril-hydrochlorothiazide (PRINZIDE,ZESTORETIC) 20-12.5 MG per tablet Take 1 tablet by mouth daily. 08/05/13   Jodelle GrossKathryn M Lawrence, NP  metFORMIN (GLUCOPHAGE) 1000 MG tablet Take 1,000 mg by mouth 2 (two) times daily with a meal.     Historical Provider, MD  QC LORATADINE ALLERGY RELIEF 10 MG tablet Take 10 mg by mouth daily.  04/06/12   Historical Provider, MD  simvastatin (ZOCOR) 40 MG tablet TAKE (1) TABLET BY MOUTH AT BEDTIME FOR CHOLESTEROL. 11/06/13   Jodelle GrossKathryn M Lawrence, NP  TOPROL XL 50 MG 24 hr tablet TAKE 1 AND 1/2 TABLETS  BY MOUTH ONCE DAILY. 08/07/13   Jodelle GrossKathryn M Lawrence, NP   BP 166/88  Pulse 79  Temp(Src) 98 F (36.7 C) (Oral)  Resp 20  Ht 6\' 1"  (1.854 m)  Wt 285 lb (129.275 kg)  BMI 37.61 kg/m2  SpO2 99%  Physical Exam  Nursing note and vitals reviewed. Constitutional: He is oriented to person, place, and time. He appears well-developed and well-nourished. No distress.  Somewhat obese  HENT:  Head: Normocephalic and atraumatic.  Eyes: EOM are normal.  Neck: Neck supple. No JVD present. No tracheal deviation present.  Cardiovascular: Normal rate.   Pulmonary/Chest: Effort normal. No respiratory distress.  Upper airway sounds Diffuse harsh rhonchi No wheezes  Abdominal: There is no tenderness.  Musculoskeletal:  Normal range of motion. He exhibits edema (bilateral lower extremities).  Neurological: He is alert and oriented to person, place, and time.  Skin: Skin is warm and dry.  Psychiatric: He has a normal mood and affect. His behavior is normal.    ED Course  Procedures (including critical care time)  COORDINATION OF CARE: 12:01 PM-Discussed treatment plan which includes cardiac workup with pt at bedside and pt agreed to plan.     Labs Review Labs Reviewed - No data to display  Imaging Review No results found.   EKG Interpretation   Date/Time:  Sunday Mar 09 2014 10:48:28 EDT Ventricular Rate:  70 PR Interval:  180 QRS Duration: 164 QT Interval:  436 QTC Calculation: 470 R Axis:   -141 Text Interpretation:  Normal sinus rhythm Right bundle branch block  Abnormal ECG No significant change since last tracing Confirmed by  Nikayla Madaris  MD, Everard Interrante [phone]([id number]) on 03/09/2014 11:56:32 AM      MDM   Final diagnoses:  URI (upper respiratory infection)    Patient with cough. Some production. EKG reassuring.  Has been on zpack. Will treat with augmentin    I personally performed the services described in this documentation, which was scribed in my presence. The recorded information has been reviewed and is accurate.     Juliet RudeNathan R. Rubin PayorPickering, MD 03/11/14 1220

## 2014-03-09 NOTE — ED Notes (Signed)
Pt c/o nonproductive cough and SOB x 3 weeks.  Reports feels like has something in his throat and chest to cough up  But cant get anything up.  Has been taking Robitussin Dm without relief.  Reports took a zpack last week and cough syrup.

## 2014-03-09 NOTE — Discharge Instructions (Signed)

## 2014-04-02 ENCOUNTER — Telehealth: Payer: Self-pay | Admitting: Adult Health

## 2014-04-02 MED ORDER — LISINOPRIL-HYDROCHLOROTHIAZIDE 20-12.5 MG PO TABS: 1.0000 | ORAL_TABLET | Freq: Every day | ORAL | Status: DC

## 2014-04-02 MED ORDER — METOPROLOL SUCCINATE ER 50 MG PO TB24: ORAL_TABLET | ORAL | Status: DC

## 2014-04-02 NOTE — Telephone Encounter (Signed)
Received fax refill request  Rx # [id number] Medication:  Lisinopril/HCTZ 20mg /12.5 Qty 30 Sig:  Take one tablet by mouth once daily Physician:  Lyman Bishop Received fax refill request  Rx # [id number] Medication:  Toprol XL 50 mg tablet SA Qty 45 tab Sig:  Take 1 and 1/2 tablets by mouth once daily Physician:  Lyman Bishop

## 2014-04-02 NOTE — Telephone Encounter (Signed)
Medication sent via escribe.  

## 2014-06-02 ENCOUNTER — Other Ambulatory Visit: Payer: Self-pay | Admitting: Cardiology

## 2014-10-06 ENCOUNTER — Other Ambulatory Visit: Payer: Self-pay | Admitting: Cardiology

## 2014-10-06 MED ORDER — ATORVASTATIN CALCIUM 80 MG PO TABS: 80.0000 mg | ORAL_TABLET | Freq: Every day | ORAL | Status: DC

## 2014-10-06 NOTE — Telephone Encounter (Signed)
Received fax refill request  Rx # [id number] Medication:  Atorvastatin 80 mg tablets Qty 30 Sig:  Take one tablet by mouth once daily Physician:  Wyline MoodBranch

## 2014-10-06 NOTE — Telephone Encounter (Signed)
refill complete 

## 2014-11-04 ENCOUNTER — Other Ambulatory Visit: Payer: Self-pay | Admitting: Cardiology

## 2014-11-11 ENCOUNTER — Other Ambulatory Visit (HOSPITAL_COMMUNITY): Payer: Self-pay | Admitting: Podiatry

## 2014-11-11 DIAGNOSIS — M14671 Charcot's joint, right ankle and foot: Secondary | ICD-10-CM

## 2014-11-11 DIAGNOSIS — R609 Edema, unspecified: Secondary | ICD-10-CM

## 2014-11-11 DIAGNOSIS — E1343 Other specified diabetes mellitus with diabetic autonomic (poly)neuropathy: Secondary | ICD-10-CM

## 2014-11-19 ENCOUNTER — Ambulatory Visit (HOSPITAL_COMMUNITY): Admission: RE | Admit: 2014-11-19 | Payer: Medicaid Other | Source: Ambulatory Visit

## 2014-11-19 ENCOUNTER — Ambulatory Visit (HOSPITAL_COMMUNITY): Payer: Medicaid Other

## 2014-11-21 ENCOUNTER — Ambulatory Visit (HOSPITAL_COMMUNITY)
Admission: RE | Admit: 2014-11-21 | Discharge: 2014-11-21 | Disposition: A | Discharge status: Home / Self Care | Payer: Medicaid Other | Source: Ambulatory Visit | Attending: Podiatry | Admitting: Podiatry

## 2014-11-21 DIAGNOSIS — E119 Type 2 diabetes mellitus without complications: Secondary | ICD-10-CM | POA: Diagnosis not present

## 2014-11-21 DIAGNOSIS — R252 Cramp and spasm: Secondary | ICD-10-CM | POA: Insufficient documentation

## 2014-11-21 DIAGNOSIS — R609 Edema, unspecified: Secondary | ICD-10-CM

## 2014-11-21 DIAGNOSIS — M14671 Charcot's joint, right ankle and foot: Secondary | ICD-10-CM

## 2014-11-21 DIAGNOSIS — I10 Essential (primary) hypertension: Secondary | ICD-10-CM | POA: Diagnosis not present

## 2014-11-21 DIAGNOSIS — E669 Obesity, unspecified: Secondary | ICD-10-CM | POA: Diagnosis not present

## 2014-11-21 DIAGNOSIS — E1343 Other specified diabetes mellitus with diabetic autonomic (poly)neuropathy: Secondary | ICD-10-CM

## 2015-01-05 ENCOUNTER — Other Ambulatory Visit: Payer: Self-pay | Admitting: Cardiology

## 2015-01-12 ENCOUNTER — Ambulatory Visit (INDEPENDENT_AMBULATORY_CARE_PROVIDER_SITE_OTHER): Payer: Medicaid Other | Admitting: Cardiology

## 2015-01-12 ENCOUNTER — Encounter: Payer: Self-pay | Admitting: Cardiology

## 2015-01-12 VITALS — BP 126/78 | HR 72 | Ht 72.0 in | Wt 258.0 lb

## 2015-01-12 DIAGNOSIS — E785 Hyperlipidemia, unspecified: Secondary | ICD-10-CM

## 2015-01-12 DIAGNOSIS — I2510 Atherosclerotic heart disease of native coronary artery without angina pectoris: Secondary | ICD-10-CM

## 2015-01-12 DIAGNOSIS — I10 Essential (primary) hypertension: Secondary | ICD-10-CM

## 2015-01-12 NOTE — Progress Notes (Signed)
Clinical Summary [name] is a 59 y.o.male seen today for follow up of the following medical problems.   1. 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  - echo 07/2011 LVEF 45-50% with inferior hypokinesis.   - denies any chest pain, no SOB, or DOE - compliant with meds  2. HTN - checks bp at home, typically around 120s/60s - compliant with meds  3. Hyperlipidemia - compliant with lipitor -no recent lipid panel in our system  4. CVA - on aggrenox for secondary prevention. Also on statin, ACE-I    Past Medical History  Diagnosis Date  . Coronary artery disease   . Hypertension   . Hyperlipidemia   . OSA on CPAP   . Obesity   . Diabetes mellitus   . Diastolic heart failure      No Known Allergies   Current Outpatient Prescriptions  Medication Sig Dispense Refill  . albuterol (PROVENTIL HFA;VENTOLIN HFA) 108 (90 BASE) MCG/ACT inhaler Inhale 2 puffs into the lungs every 6 (six) hours as needed for wheezing or shortness of breath.    Marland Kitchen amoxicillin-clavulanate (AUGMENTIN XR) 1000-62.5 MG per tablet Take 2 tablets by mouth 2 (two) times daily. 14 tablet 0  . atorvastatin (LIPITOR) 80 MG tablet Take 1 tablet (80 mg total) by mouth daily. 30 tablet 11  . chlorpheniramine-HYDROcodone (TUSSIONEX) 10-8 MG/5ML LQCR Take 10 mLs by mouth 2 (two) times daily as needed for cough.    . dipyridamole-aspirin (AGGRENOX) 200-25 MG per 12 hr capsule Take 1 capsule by mouth 2 (two) times daily.    . fluticasone (FLONASE) 50 MCG/ACT nasal spray Place 2 sprays into both nostrils daily.    Marland Kitchen gabapentin (NEURONTIN) 300 MG capsule Take 300 mg by mouth 3 (three) times daily.    Marland Kitchen lisinopril-hydrochlorothiazide (PRINZIDE,ZESTORETIC) 20-12.5 MG per tablet TAKE 1 TABLET BY MOUTH ONCE DAILY. 30 tablet 2  . loratadine (CLARITIN) 10 MG tablet Take 10 mg by mouth at bedtime.    . metFORMIN (GLUCOPHAGE) 1000 MG tablet Take 1,000 mg by mouth 2 (two) times daily with  a meal.     . metoprolol succinate (TOPROL-XL) 50 MG 24 hr tablet TAKE 1 AND 1/2 TABLETS BY MOUTH ONCE DAILY. 45 tablet 6   No current facility-administered medications for this visit.     Past Surgical History  Procedure Laterality Date  . No past surgeries       No Known Allergies    Family History  Problem Relation Age of Onset  . Diabetes Father   . Hypertension Father      Social History Mr. Birkeland reports that he has been smoking Cigarettes.  He has smoked for the past 15 years. He has never used smokeless tobacco. Mr. Lorusso reports that he drinks about 2.4 oz of alcohol per week.   Review of Systems CONSTITUTIONAL: No weight loss, fever, chills, weakness or fatigue.  HEENT: Eyes: No visual loss, blurred vision, double vision or yellow sclerae.No hearing loss, sneezing, congestion, runny nose or sore throat.  SKIN: No rash or itching.  CARDIOVASCULAR: per HPI RESPIRATORY: No shortness of breath, cough or sputum.  GASTROINTESTINAL: No anorexia, nausea, vomiting or diarrhea. No abdominal pain or blood.  GENITOURINARY: No burning on urination, no polyuria NEUROLOGICAL: No headache, dizziness, syncope, paralysis, ataxia, numbness or tingling in the extremities. No change in bowel or bladder control.  MUSCULOSKELETAL: No muscle, back pain, joint pain or stiffness.  LYMPHATICS: No enlarged nodes.  No history of splenectomy.  PSYCHIATRIC: No history of depression or anxiety.  ENDOCRINOLOGIC: No reports of sweating, cold or heat intolerance. No polyuria or polydipsia.  Marland Kitchen.   Physical Examination p 72 bp 126/78 Wt 258 lbs BMI 35 Gen: resting comfortably, no acute distress HEENT: no scleral icterus, pupils equal round and reactive, no palptable cervical adenopathy,  CV: RRR, no m/r/g, no JVD, no carotid bruits Resp: Clear to auscultation bilaterally GI: abdomen is soft, non-tender, non-distended, normal bowel sounds, no hepatosplenomegaly MSK: extremities are warm,  no edema.  Skin: warm, no rash Neuro:  no focal deficits Psych: appropriate affect   Diagnostic Studies 07/2011 Echo LVEF 45-50%, mod biatrial enlargement, diastolic function not described.   05/2012 Carotid US IMPRESSION: Bilateral atherosclerosis, not resulting in hemodynamically significant stenosis.  Jan 2016 ABI IMPRESSION: Normal ABIs at rest. Nonocclusive peripheral vascular disease, noncompressible in the left thigh and calf regions.   Assessment and Plan   1. CAD - no current symptoms, continue risk factor modification and secondary prevention  2. HTN - home numbers are at goal, continue current meds  3. Hyperlipidemia - will request most recent panel from pcp - continue high dose statin   F/u 1 year     Antoine PocheJonathan F. Mayara Paulson, M.D.

## 2015-01-12 NOTE — Patient Instructions (Signed)
Your physician wants you to follow-up in: 1 year with DrBranch You will receive a reminder letter in the mail two months in advance. If you don't receive a letter, please call our office to schedule the follow-up appointment.     Your physician recommends that you continue on your current medications as directed. Please refer to the Current Medication list given to you today.      Thank you for choosing [facility] !        

## 2015-02-02 ENCOUNTER — Other Ambulatory Visit: Payer: Self-pay | Admitting: Cardiology

## 2015-02-02 MED ORDER — LISINOPRIL-HYDROCHLOROTHIAZIDE 20-12.5 MG PO TABS: 1.0000 | ORAL_TABLET | Freq: Every day | ORAL | Status: DC

## 2015-02-02 NOTE — Telephone Encounter (Signed)
escribed refill 

## 2015-02-02 NOTE — Telephone Encounter (Signed)
Received fax refill request  Rx # [id number] Medication:  Lisinopril/HCTZ 20mg /12.5 Qty 30 tab Sig:  Take 1 tablet by mouth once daily. Physician:  Dina RichBranch, Jonathan

## 2015-09-02 ENCOUNTER — Other Ambulatory Visit: Payer: Self-pay | Admitting: Cardiology

## 2015-10-07 ENCOUNTER — Other Ambulatory Visit: Payer: Self-pay | Admitting: Cardiology

## 2016-01-06 ENCOUNTER — Ambulatory Visit: Payer: Medicaid Other | Admitting: Cardiology

## 2016-02-10 ENCOUNTER — Telehealth: Payer: Self-pay | Admitting: Cardiology

## 2016-02-10 ENCOUNTER — Ambulatory Visit (INDEPENDENT_AMBULATORY_CARE_PROVIDER_SITE_OTHER): Payer: Medicaid Other | Admitting: Cardiology

## 2016-02-10 ENCOUNTER — Encounter: Payer: Self-pay | Admitting: Cardiology

## 2016-02-10 VITALS — BP 114/62 | HR 74 | Ht 72.0 in | Wt 272.0 lb

## 2016-02-10 DIAGNOSIS — Z720 Tobacco use: Secondary | ICD-10-CM | POA: Diagnosis not present

## 2016-02-10 DIAGNOSIS — I2510 Atherosclerotic heart disease of native coronary artery without angina pectoris: Secondary | ICD-10-CM

## 2016-02-10 DIAGNOSIS — I10 Essential (primary) hypertension: Secondary | ICD-10-CM

## 2016-02-10 DIAGNOSIS — E785 Hyperlipidemia, unspecified: Secondary | ICD-10-CM | POA: Diagnosis not present

## 2016-02-10 MED ORDER — VARENICLINE TARTRATE 0.5 MG X 11 & 1 MG X 42 PO MISC: ORAL | Status: DC

## 2016-02-10 NOTE — Telephone Encounter (Signed)
Called pt and reminded him that he has the prescription and needs to take it by the pharmacy to be filled.

## 2016-02-10 NOTE — Telephone Encounter (Signed)
Please call patient regarding RX from today's appointment. / tg

## 2016-02-10 NOTE — Telephone Encounter (Signed)
Pt has called the pharmacy twice to see if they've received his new medication and they told him they had not received anything

## 2016-02-10 NOTE — Patient Instructions (Signed)
Medication Instructions:  START CHANTIX  0.5 MG ONCE DAILY FOR 3 DAYS 0.5 MG TWO TIMES DAILY FOR 4 DAYS  1 MG TWO TIMES DAILY FOR 11 WEEKS   Labwork: I WILL REQUEST LABS FROM YOUR PCP  Testing/Procedures: NONE  Follow-Up: Your physician wants you to follow-up in: 1 YEAR.  You will receive a reminder letter in the mail two months in advance. If you don't receive a letter, please call our office to schedule the follow-up appointment.   Any Other Special Instructions Will Be Listed Below (If Applicable).     If you need a refill on your cardiac medications before your next appointment, please call your pharmacy.

## 2016-02-10 NOTE — Telephone Encounter (Signed)
Called pt, he was confused about prescription. I explained it to him.

## 2016-02-10 NOTE — Progress Notes (Signed)
Patient ID: [name], male   DOB: February 18, 1956, 60 y.o.   MRN: [id number]     Clinical Summary [name] is a 60 y.o.male seen today for follow up of the following medical problems.   1. 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  - echo 07/2011 LVEF 45-50% with inferior hypokinesis.   - no recent chest pain. No SOB or DOE. - compliant with meds  2. HTN - checks bp at home, typically around 130s/70s - compliant with meds  3. Hyperlipidemia - compliant with lipitor -reports recent labs with pcp  4. CVA - on aggrenox for secondary prevention. Also on statin, ACE-I - no recent neuro symptoms  5. Tobacco abuse - 1/2 ppd. Recently given Rx from pcp for nicotine patches that he tried for 6 months without benefit   Past Medical History  Diagnosis Date  . Coronary artery disease   . Hypertension   . Hyperlipidemia   . OSA on CPAP   . Obesity   . Diabetes mellitus   . Diastolic heart failure      No Known Allergies   Current Outpatient Prescriptions  Medication Sig Dispense Refill  . albuterol (PROVENTIL HFA;VENTOLIN HFA) 108 (90 BASE) MCG/ACT inhaler Inhale 2 puffs into the lungs every 6 (six) hours as needed for wheezing or shortness of breath.    Marland Kitchen amoxicillin-clavulanate (AUGMENTIN XR) 1000-62.5 MG per tablet Take 2 tablets by mouth 2 (two) times daily. 14 tablet 0  . aspirin EC 81 MG tablet Take 81 mg by mouth daily.    Marland Kitchen atorvastatin (LIPITOR) 80 MG tablet TAKE 1 TABLET BY MOUTH ONCE DAILY. 30 tablet 6  . chlorpheniramine-HYDROcodone (TUSSIONEX) 10-8 MG/5ML LQCR Take 10 mLs by mouth 2 (two) times daily as needed for cough.    . dipyridamole-aspirin (AGGRENOX) 200-25 MG per 12 hr capsule Take 1 capsule by mouth 2 (two) times daily.    . fluticasone (FLONASE) 50 MCG/ACT nasal spray Place 2 sprays into both nostrils daily.    Marland Kitchen gabapentin (NEURONTIN) 300 MG capsule Take 300 mg by mouth 3 (three) times daily.    Marland Kitchen  lisinopril-hydrochlorothiazide (PRINZIDE,ZESTORETIC) 20-12.5 MG tablet TAKE 1 TABLET BY MOUTH ONCE DAILY. 30 tablet 5  . loratadine (CLARITIN) 10 MG tablet Take 10 mg by mouth at bedtime.    . metFORMIN (GLUCOPHAGE) 1000 MG tablet Take 1,000 mg by mouth 2 (two) times daily with a meal.     . metoprolol succinate (TOPROL-XL) 50 MG 24 hr tablet TAKE 1 AND 1/2 TABLETS BY MOUTH ONCE DAILY. 45 tablet 5   No current facility-administered medications for this visit.     Past Surgical History  Procedure Laterality Date  . No past surgeries       No Known Allergies    Family History  Problem Relation Age of Onset  . Diabetes Father   . Hypertension Father      Social History [name] reports that he has been smoking Cigarettes.  He has smoked for the past 15 years. He has never used smokeless tobacco. [name] reports that he drinks about 2.4 oz of alcohol per week.   Review of Systems CONSTITUTIONAL: No weight loss, fever, chills, weakness or fatigue.  HEENT: Eyes: No visual loss, blurred vision, double vision or yellow sclerae.No hearing loss, sneezing, congestion, runny nose or sore throat.  SKIN: No rash or itching.  CARDIOVASCULAR: per HPI RESPIRATORY: No shortness of breath, cough or sputum.  GASTROINTESTINAL:  No anorexia, nausea, vomiting or diarrhea. No abdominal pain or blood.  GENITOURINARY: No burning on urination, no polyuria NEUROLOGICAL: No headache, dizziness, syncope, paralysis, ataxia, numbness or tingling in the extremities. No change in bowel or bladder control.  MUSCULOSKELETAL: No muscle, back pain, joint pain or stiffness.  LYMPHATICS: No enlarged nodes. No history of splenectomy.  PSYCHIATRIC: No history of depression or anxiety.  ENDOCRINOLOGIC: No reports of sweating, cold or heat intolerance. No polyuria or polydipsia.  Marland Kitchen.   Physical Examination Filed Vitals:   02/10/16 0859  BP: 114/62  Pulse: 74   Filed Vitals:   02/10/16 0859  Height:  6' (1.829 m)  Weight: 272 lb (123.378 kg)    Gen: resting comfortably, no acute distress HEENT: no scleral icterus, pupils equal round and reactive, no palptable cervical adenopathy,  CV: RRR, no m/r/g, no jvd Resp: Clear to auscultation bilaterally GI: abdomen is soft, non-tender, non-distended, normal bowel sounds, no hepatosplenomegaly MSK: extremities are warm, no edema.  Skin: warm, no rash Neuro:  no focal deficits Psych: appropriate affect   Diagnostic Studies 07/2011 Echo LVEF 45-50%, mod biatrial enlargement, diastolic function not described.   05/2012 Carotid US IMPRESSION: Bilateral atherosclerosis, not resulting in hemodynamically significant stenosis.  Jan 2016 ABI IMPRESSION: Normal ABIs at rest. Nonocclusive peripheral vascular disease, noncompressible in the left thigh and calf regions.    Assessment and Plan   1. 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 HTN - home numbers are at goal, we will continue current meds  3. Hyperlipidemia - request  recent labs from pcp - continue high dose statin  4. Tobacco abuse - spent greater than 3 minutes counseling on associated health risks and indications to quit. Given Rx for chantix.   F/u 1 year  Antoine PocheJonathan F. Rayley Gao, M.D.

## 2016-02-29 ENCOUNTER — Other Ambulatory Visit: Payer: Self-pay | Admitting: Cardiology

## 2016-03-14 ENCOUNTER — Telehealth: Payer: Self-pay | Admitting: Cardiology

## 2016-03-14 NOTE — Telephone Encounter (Signed)
Well he is out of meds...what do you suggest ?

## 2016-03-14 NOTE — Telephone Encounter (Signed)
Patient states that he needs another RX for Chantix. / tg

## 2016-03-14 NOTE — Telephone Encounter (Signed)
He should have taken 0.5mg  qday for 3 days, then 0.3mg  bid for 3 days then 1mg  bid for 11 weeks (total of 12 weeks). If he was taking as prescribed he needs another 8 weeks of 1mg  bid.    Dominga FerryJ Rajni Holsworth MD

## 2016-03-14 NOTE — Telephone Encounter (Signed)
Given RX 02/09/16 for chantix is out now and wants more,do we refill and at what dose.he states he has stopped smoking already

## 2016-03-15 MED ORDER — VARENICLINE TARTRATE 0.5 MG X 11 & 1 MG X 42 PO MISC: ORAL | Status: DC

## 2016-03-15 NOTE — Telephone Encounter (Signed)
Restart the prescription from the beginning as written for the 12 week course   Dominga FerryJ Branch MD

## 2016-03-15 NOTE — Telephone Encounter (Signed)
Faxed new prescription to [facility].

## 2016-03-24 ENCOUNTER — Other Ambulatory Visit: Payer: Self-pay

## 2016-03-24 MED ORDER — ATORVASTATIN CALCIUM 80 MG PO TABS: 80.0000 mg | ORAL_TABLET | Freq: Every day | ORAL | Status: DC

## 2016-03-24 NOTE — Telephone Encounter (Signed)
Refill complete 

## 2016-09-07 ENCOUNTER — Other Ambulatory Visit: Payer: Self-pay | Admitting: Cardiology

## 2016-10-03 ENCOUNTER — Other Ambulatory Visit: Payer: Self-pay | Admitting: Cardiology

## 2017-02-13 ENCOUNTER — Encounter: Payer: Self-pay | Admitting: Cardiology

## 2017-02-13 ENCOUNTER — Ambulatory Visit (INDEPENDENT_AMBULATORY_CARE_PROVIDER_SITE_OTHER): Payer: Medicaid Other | Admitting: Cardiology

## 2017-02-13 ENCOUNTER — Encounter: Payer: Self-pay | Admitting: *Deleted

## 2017-02-13 VITALS — BP 118/69 | HR 65 | Ht 73.0 in | Wt 275.0 lb

## 2017-02-13 DIAGNOSIS — Z8673 Personal history of transient ischemic attack (TIA), and cerebral infarction without residual deficits: Secondary | ICD-10-CM | POA: Diagnosis not present

## 2017-02-13 DIAGNOSIS — E782 Mixed hyperlipidemia: Secondary | ICD-10-CM

## 2017-02-13 DIAGNOSIS — I10 Essential (primary) hypertension: Secondary | ICD-10-CM

## 2017-02-13 DIAGNOSIS — I2510 Atherosclerotic heart disease of native coronary artery without angina pectoris: Secondary | ICD-10-CM | POA: Diagnosis not present

## 2017-02-13 NOTE — Patient Instructions (Signed)
Your physician wants you to follow-up in: 1 year with Dr. Branch. You will receive a reminder letter in the mail two months in advance. If you don't receive a letter, please call our office to schedule the follow-up appointment.  Your physician recommends that you continue on your current medications as directed. Please refer to the Current Medication list given to you today.  If you need a refill on your cardiac medications before your next appointment, please call your pharmacy.  Thank you for choosing [facility]!   

## 2017-02-13 NOTE — Progress Notes (Signed)
Clinical Summary Mr. Francois is a 61 y.o.male seen today for follow up of the following medical problems.   1. 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  - echo 07/2011 LVEF 45-50% with inferior hypokinesis.   - denies any chest pain, No SOB or DOE - compliant with meds.   2. HTN - checks bp at home, typically around 130s/80s - he is compliant with meds  3. Hyperlipidemia - compliant with lipitor - he reports he had labs with pcp recently  4. History of CVA - on aggrenox for secondary prevention. Also on statin, ACE-I - denies any recent neuro symptoms.       Past Medical History:  Diagnosis Date  . Coronary artery disease   . Diabetes mellitus   . Diastolic heart failure   . Hyperlipidemia   . Hypertension   . Obesity   . OSA on CPAP      No Known Allergies   Current Outpatient Prescriptions  Medication Sig Dispense Refill  . aspirin EC 81 MG tablet Take 81 mg by mouth daily.    Marland Kitchen atorvastatin (LIPITOR) 80 MG tablet Take 1 tablet (80 mg total) by mouth daily. 30 tablet 11  . canagliflozin (INVOKANA) 300 MG TABS tablet Take 300 mg by mouth daily before breakfast.    . dipyridamole-aspirin (AGGRENOX) 200-25 MG per 12 hr capsule Take 1 capsule by mouth 2 (two) times daily.    Marland Kitchen gabapentin (NEURONTIN) 400 MG capsule Take 400 mg by mouth 3 (three) times daily.    Marland Kitchen lisinopril-hydrochlorothiazide (PRINZIDE,ZESTORETIC) 20-12.5 MG tablet TAKE 1 TABLET BY MOUTH ONCE DAILY. 30 tablet 6  . loratadine (CLARITIN) 10 MG tablet Take 10 mg by mouth at bedtime.    . metFORMIN (GLUCOPHAGE) 500 MG tablet Take 500 mg by mouth 2 (two) times daily with a meal.    . metoprolol succinate (TOPROL-XL) 50 MG 24 hr tablet TAKE 1 AND 1/2 TABLETS BY MOUTH ONCE DAILY. 45 tablet 11  . varenicline (CHANTIX STARTING MONTH PAK) 0.5 MG X 11 & 1 MG X 42 tablet Take one 0.5 mg tablet by mouth once daily for 3 days, then increase to one 0.5 mg tablet twice  daily for 4 days, then increase to one 1 mg tablet twice daily. 53 tablet 1   No current facility-administered medications for this visit.      Past Surgical History:  Procedure Laterality Date  . NO PAST SURGERIES       No Known Allergies    Family History  Problem Relation Age of Onset  . Diabetes Father   . Hypertension Father      Social History Mr. Krist reports that he has been smoking Cigarettes.  He has a 15.00 pack-year smoking history. He has never used smokeless tobacco. Mr. Neuharth reports that he drinks about 2.4 oz of alcohol per week .   Review of Systems CONSTITUTIONAL: No weight loss, fever, chills, weakness or fatigue.  HEENT: Eyes: No visual loss, blurred vision, double vision or yellow sclerae.No hearing loss, sneezing, congestion, runny nose or sore throat.  SKIN: No rash or itching.  CARDIOVASCULAR: per hpi RESPIRATORY: No shortness of breath, cough or sputum.  GASTROINTESTINAL: No anorexia, nausea, vomiting or diarrhea. No abdominal pain or blood.  GENITOURINARY: No burning on urination, no polyuria NEUROLOGICAL: No headache, dizziness, syncope, paralysis, ataxia, numbness or tingling in the extremities. No change in bowel or bladder control.  MUSCULOSKELETAL: No muscle, back  pain, joint pain or stiffness.  LYMPHATICS: No enlarged nodes. No history of splenectomy.  PSYCHIATRIC: No history of depression or anxiety.  ENDOCRINOLOGIC: No reports of sweating, cold or heat intolerance. No polyuria or polydipsia.  Marland Kitchen   Physical Examination Vitals:   02/13/17 1028  BP: 118/69  Pulse: 65   Vitals:   02/13/17 1028  Weight: 275 lb (124.7 kg)  Height:  (1.854 m)    Gen: resting comfortably, no acute distress HEENT: no scleral icterus, pupils equal round and reactive, no palptable cervical adenopathy,  CV: RRR, no m/r/g, no jvd Resp: Clear to auscultation bilaterally GI: abdomen is soft, non-tender, non-distended, normal bowel sounds, no  hepatosplenomegaly MSK: extremities are warm, no edema.  Skin: warm, no rash Neuro:  no focal deficits Psych: appropriate affect   Diagnostic Studies 07/2011 Echo LVEF 45-50%, mod biatrial enlargement, diastolic function not described.   05/2012 Carotid US IMPRESSION: Bilateral atherosclerosis, not resulting in hemodynamically significant stenosis.  Jan 2016 ABI IMPRESSION: Normal ABIs at rest. Nonocclusive peripheral vascular disease, noncompressible in the left thigh and calf regions.    Assessment and Plan   1. CAD - no current symptoms - EKG in clinic today without new ischemic changes - continue current meds  2. HTN -at goal, continue current meds  3. Hyperlipidemia - we will ask for  recent labs from pcp - he will continue high dose statin  4. History of CVA - continue secondary prevention.   F/u 1 year      Antoine Poche, M.D.

## 2017-03-31 ENCOUNTER — Other Ambulatory Visit: Payer: Self-pay | Admitting: Cardiology

## 2017-04-26 ENCOUNTER — Other Ambulatory Visit: Payer: Self-pay | Admitting: Cardiology

## 2017-06-22 DIAGNOSIS — R05 Cough: Secondary | ICD-10-CM | POA: Insufficient documentation

## 2017-06-22 DIAGNOSIS — K219 Gastro-esophageal reflux disease without esophagitis: Secondary | ICD-10-CM | POA: Insufficient documentation

## 2017-06-22 DIAGNOSIS — R053 Chronic cough: Secondary | ICD-10-CM | POA: Insufficient documentation

## 2017-06-28 ENCOUNTER — Ambulatory Visit (HOSPITAL_COMMUNITY)
Admission: RE | Admit: 2017-06-28 | Discharge: 2017-06-28 | Disposition: A | Discharge status: Home / Self Care | Payer: Medicaid Other | Source: Ambulatory Visit | Attending: Otolaryngology | Admitting: Otolaryngology

## 2017-06-28 ENCOUNTER — Other Ambulatory Visit (HOSPITAL_COMMUNITY): Payer: Self-pay | Admitting: Otolaryngology

## 2017-06-28 DIAGNOSIS — R059 Cough, unspecified: Secondary | ICD-10-CM

## 2017-06-28 DIAGNOSIS — F17200 Nicotine dependence, unspecified, uncomplicated: Secondary | ICD-10-CM | POA: Insufficient documentation

## 2017-06-28 DIAGNOSIS — R05 Cough: Secondary | ICD-10-CM | POA: Diagnosis not present

## 2017-08-04 DIAGNOSIS — J343 Hypertrophy of nasal turbinates: Secondary | ICD-10-CM | POA: Insufficient documentation

## 2017-10-05 ENCOUNTER — Other Ambulatory Visit: Payer: Self-pay | Admitting: Cardiology

## 2017-10-13 ENCOUNTER — Ambulatory Visit (INDEPENDENT_AMBULATORY_CARE_PROVIDER_SITE_OTHER)
Admission: RE | Admit: 2017-10-13 | Discharge: 2017-10-13 | Disposition: A | Discharge status: Home / Self Care | Payer: Medicaid Other | Source: Ambulatory Visit | Attending: Pulmonary Disease | Admitting: Pulmonary Disease

## 2017-10-13 ENCOUNTER — Encounter: Payer: Self-pay | Admitting: Pulmonary Disease

## 2017-10-13 ENCOUNTER — Ambulatory Visit: Payer: Medicaid Other | Admitting: Pulmonary Disease

## 2017-10-13 ENCOUNTER — Telehealth: Payer: Self-pay | Admitting: Pulmonary Disease

## 2017-10-13 VITALS — BP 140/58 | HR 69 | Ht 73.0 in | Wt 276.2 lb

## 2017-10-13 DIAGNOSIS — R05 Cough: Secondary | ICD-10-CM

## 2017-10-13 DIAGNOSIS — R059 Cough, unspecified: Secondary | ICD-10-CM

## 2017-10-13 MED ORDER — GLYCOPYRROLATE-FORMOTEROL 9-4.8 MCG/ACT IN AERO: 2.0000 | INHALATION_SPRAY | Freq: Two times a day (BID) | RESPIRATORY_TRACT | 5 refills | Status: DC

## 2017-10-13 MED ORDER — BUDESONIDE-FORMOTEROL FUMARATE 160-4.5 MCG/ACT IN AERO: 2.0000 | INHALATION_SPRAY | Freq: Two times a day (BID) | RESPIRATORY_TRACT | 6 refills | Status: DC

## 2017-10-13 MED ORDER — AZITHROMYCIN 250 MG PO TABS: ORAL_TABLET | ORAL | 0 refills | Status: DC

## 2017-10-13 MED ORDER — FLUTTER DEVI: 0 refills | Status: DC

## 2017-10-13 NOTE — Telephone Encounter (Signed)
Try bevespi. thanks

## 2017-10-13 NOTE — Telephone Encounter (Signed)
Spoke with pt, advised him that N[place]racks was closed. He has not tried any other inhalers so they may only cover Flovent, Spiriva handihaler, and Bevespi with Medicaid. PM did you want to change to one of the alternatives or try to go through with the prior authorization. Please advise.

## 2017-10-13 NOTE — Progress Notes (Signed)
[name]    [id number]    03-Jun-1956  Primary Care Physician:Avbuere, Dorma RussellEdwin, MD  Referring Physician: Fleet ContrasAvbuere, Edwin, MD [address] [place] [id number]  Chief complaint:  Consult for evaluation of cough, congestion  HPI: 61 year old with history of diabetes, hypertension, hyperlipidemia, OSA, coronary artery disease.  He has complains of cough with congestion for the past 1 year.  Sputum is yellow in color.  Denies any fevers, chills.  This is associated with dyspnea with activity, wheezing.  He saw his primary care Dr. Jenne PaneBates and was given pills and inhaler.  The patient does not recall the name He is using Mucinex over-the-counter.  Pets: Dog, no cats, birds, 5 monos Occupation: Financial plannerConstruction worker and mechanic Exposures: No known exposures Smoking history: 20-pack-year smoking history.  Quit November 2018 Travel History: Not significant  Outpatient Encounter Medications as of 10/13/2017  Medication Sig  . aspirin EC 81 MG tablet Take 81 mg by mouth daily.  Marland Kitchen. atorvastatin (LIPITOR) 80 MG tablet TAKE 1 TABLET BY MOUTH ONCE DAILY.  . canagliflozin (INVOKANA) 300 MG TABS tablet Take 300 mg by mouth daily before breakfast.  . cetirizine (ZYRTEC) 10 MG tablet Take 10 mg by mouth daily.  Marland Kitchen. dipyridamole-aspirin (AGGRENOX) 200-25 MG per 12 hr capsule Take 1 capsule by mouth 2 (two) times daily.  . ergocalciferol (VITAMIN D2) 50000 units capsule Take 50,000 Units by mouth once a week.  . fluticasone (FLONASE) 50 MCG/ACT nasal spray 2 sprays by Each Nare route daily.  Marland Kitchen. gabapentin (NEURONTIN) 400 MG capsule Take 400 mg by mouth 3 (three) times daily.  . Insulin Glargine (LANTUS SOLOSTAR) 100 UNIT/ML Solostar Pen Inject 10 Units into the skin daily at 10 pm.  . lisinopril-hydrochlorothiazide (PRINZIDE,ZESTORETIC) 20-12.5 MG tablet TAKE 1 TABLET BY MOUTH ONCE DAILY.  . metoprolol succinate (TOPROL-XL) 50 MG 24 hr tablet TAKE 1 AND 1/2 TABLETS BY MOUTH ONCE DAILY.    . [DISCONTINUED] omeprazole (PRILOSEC) 40 MG capsule Take 40 mg by mouth.   No facility-administered encounter medications on file as of 10/13/2017.     Allergies as of 10/13/2017  . (No Known Allergies)    Past Medical History:  Diagnosis Date  . Coronary artery disease   . Diabetes mellitus   . Diastolic heart failure   . Hyperlipidemia   . Hypertension   . Obesity   . OSA on CPAP     Past Surgical History:  Procedure Laterality Date  . CARDIAC CATHETERIZATION    . NO PAST SURGERIES      Family History  Problem Relation Age of Onset  . Diabetes Father   . Hypertension Father     Social History   Socioeconomic History  . Marital status: Single    Spouse name: Not on file  . Number of children: Not on file  . Years of education: Not on file  . Highest education level: Not on file  Social Needs  . Financial resource strain: Not on file  . Food insecurity - worry: Not on file  . Food insecurity - inability: Not on file  . Transportation needs - medical: Not on file  . Transportation needs - non-medical: Not on file  Occupational History  . Not on file  Tobacco Use  . Smoking status: Former Smoker    Packs/day: 1.00    Years: 15.00    Pack years: 15.00    Types: Cigarettes    Last attempt to quit: 09/2017  Years since quitting: 0.0  . Smokeless tobacco: Never Used  . Tobacco comment: Down to a cigarette per week  Substance and Sexual Activity  . Alcohol use: Yes    Alcohol/week: 2.4 oz    Types: 2 Cans of beer, 2 Shots of liquor per week    Comment: occ  . Drug use: No  . Sexual activity: No  Other Topics Concern  . Not on file  Social History Narrative  . Not on file    Review of systems: Review of Systems  Constitutional: Negative for fever and chills.  HENT: Negative.   Eyes: Negative for blurred vision.  Respiratory: as per HPI  Cardiovascular: Negative for chest pain and palpitations.  Gastrointestinal: Negative for vomiting,  diarrhea, blood per rectum. Genitourinary: Negative for dysuria, urgency, frequency and hematuria.  Musculoskeletal: Negative for myalgias, back pain and joint pain.  Skin: Negative for itching and rash.  Neurological: Negative for dizziness, tremors, focal weakness, seizures and loss of consciousness.  Endo/Heme/Allergies: Negative for environmental allergies.  Psychiatric/Behavioral: Negative for depression, suicidal ideas and hallucinations.  All other systems reviewed and are negative.  Physical Exam: Blood pressure (!) 140/58, pulse 69, height 6\' 1"  (1.854 m), weight 276 lb 3.2 oz (125.3 kg), SpO2 97 %. Gen:      No acute distress HEENT:  EOMI, sclera anicteric Neck:     No masses; no thyromegaly Lungs:    Clear to auscultation bilaterally; normal respiratory effort CV:         Regular rate and rhythm; no murmurs Abd:      + bowel sounds; soft, non-tender; no palpable masses, no distension Ext:    No edema; adequate peripheral perfusion Skin:      Warm and dry; no rash Neuro: alert and oriented x 3 Psych: normal mood and affect  Data Reviewed: Chest x-ray 06/28/17-prominent interstitial markings hyperinflated lungs I have reviewed the images personally.  Assessment:  Chronic bronchitis Suspect he has COPD with chronic bronchitis from smoking . Will evaluate with a chest x-ray and PFTs Give him a Z-Pak for ongoing symptoms.  Start him on Symbicort Check CBC with differential, blood allergy profile alpha-1 antitrypsin levels and phenotype Continue Mucinex and add flutter valve clearance of secretion  Ex-smoker He is a candidate for low-dose screening CT.  Refer at next clinic visit.  Plan/Recommendations: - Z-Pak, Symbicort - Futter valve and Mucinex for clearance of secretion - Chest x-ray, PFTs, CBC with differential, blood allergy profile, alpha-1 antitrypsin levels  Chilton GreathousePraveen Chetan Mehring MD [facility] Pager [phone] 10/13/2017, 11:03 AM  CC:  Fleet ContrasAvbuere, Edwin, MD

## 2017-10-13 NOTE — Patient Instructions (Addendum)
We will give you a Z-Pak.  Start on Symbicort and give you a flutter valve We will check a chest x-ray today, CBC with differential, blood allergy profile alpha-1 antitrypsin level Schedule you for pulmonary function test Follow-up in 1-2 months.

## 2017-10-13 NOTE — Addendum Note (Signed)
Addended by: Wyvonne LenzPINION, Jaecob Lowden P on: 10/13/2017 05:27 PM   Modules accepted: Orders

## 2017-10-13 NOTE — Addendum Note (Signed)
Addended by: Maxwell MarionBLANKENSHIP, MARGIE A on: 10/13/2017 11:28 AM   Modules accepted: Orders

## 2017-10-13 NOTE — Telephone Encounter (Signed)
Spoke with pt stating that PM said we could try bevespi. Pt expressed understanding. Sent script of bevespi to pt's preferred pharmacy. Nothing further needed.

## 2017-10-18 ENCOUNTER — Telehealth: Payer: Self-pay | Admitting: Pulmonary Disease

## 2017-10-18 MED ORDER — AZITHROMYCIN 250 MG PO TABS: ORAL_TABLET | ORAL | 0 refills | Status: AC

## 2017-10-18 NOTE — Telephone Encounter (Signed)
Pt is requesting a refill on his zpak prescribed last week.  C.o prod cough with yellow mucus, sinus congestion, pnd, fatigue.  States he is improved since last OV but feels that he is "not 100% better".  Denies chest pain, fever, chills. [facility] [place]  PM please advise on recs.  Thanks.

## 2017-10-18 NOTE — Telephone Encounter (Signed)
Pt calling back about abx. Requesting they be sent today.

## 2017-10-18 NOTE — Telephone Encounter (Signed)
Spoke with Maralyn SagoSarah since PM left for the day. Zpak has been called in. Patient is aware. Nothing else needed at time of call.

## 2017-10-18 NOTE — Telephone Encounter (Signed)
Pt called back, advised that we address all sick messages before the end of day, and that we were still awaiting recs from PM.  Pt expressed understanding.    PM please advise.  Thanks.

## 2017-10-19 ENCOUNTER — Telehealth: Payer: Self-pay | Admitting: Pulmonary Disease

## 2017-10-19 NOTE — Telephone Encounter (Signed)
Called and spoke with pt and he stated that PM sent zpak in yesterday to his pharmacy and he did get this today.  He will start on this today.  Nothing further is needed.

## 2017-10-20 ENCOUNTER — Telehealth: Payer: Self-pay | Admitting: Pulmonary Disease

## 2017-10-20 NOTE — Telephone Encounter (Signed)
I do not see that we called him today  Spoke with the pt and he states that he received a call from our office today at 11:15 Margie- did you need to speak with him? Please advise, thanks

## 2017-10-23 NOTE — Telephone Encounter (Signed)
Notes recorded by Cornell BarmanWhittaker, Kelli M, CMA on 10/20/2017 at 11:18 AM EST Left voice mail on machine for patient to return phone call back regarding results. X1   ------  Notes recorded by Chilton GreathouseMannam, Praveen, MD on 10/19/2017 at 2:33 PM EST Please let the pt know that CXR does not show any acute abnormality  Pt is aware of results and voiced his understanding.  Nothing further is needed.

## 2017-10-30 ENCOUNTER — Telehealth: Payer: Self-pay | Admitting: Pulmonary Disease

## 2017-10-30 NOTE — Telephone Encounter (Signed)
Notes recorded by Amalia GreenhouseHoefler, Sierra D, RN on 10/26/2017 at 3:19 PM EST [facility] ------  Notes recorded by Cornell BarmanWhittaker, Kelli M, CMA on 10/20/2017 at 11:18 AM EST Left voice mail on machine for patient to return phone call back regarding results. X1   ------  Notes recorded by Chilton GreathouseMannam, Praveen, MD on 10/19/2017 at 2:33 PM EST Please let the pt know that CXR does not show any acute abnormality  lmtcb X1 for pt.

## 2017-10-30 NOTE — Telephone Encounter (Signed)
Left a detailed msg with results on the pt's VM

## 2017-10-30 NOTE — Telephone Encounter (Signed)
Pt called back about chest xray results. Cb [phone].

## 2017-11-23 ENCOUNTER — Ambulatory Visit: Payer: Medicaid Other | Admitting: Pulmonary Disease

## 2017-12-04 ENCOUNTER — Ambulatory Visit: Payer: Medicaid Other | Admitting: Pulmonary Disease

## 2018-01-01 ENCOUNTER — Encounter: Payer: Self-pay | Admitting: Pulmonary Disease

## 2018-01-01 ENCOUNTER — Ambulatory Visit: Payer: Medicaid Other | Admitting: Pulmonary Disease

## 2018-01-01 ENCOUNTER — Ambulatory Visit (INDEPENDENT_AMBULATORY_CARE_PROVIDER_SITE_OTHER): Payer: Medicaid Other | Admitting: Pulmonary Disease

## 2018-01-01 VITALS — BP 126/70 | HR 72 | Ht 71.5 in | Wt 276.6 lb

## 2018-01-01 DIAGNOSIS — R0602 Shortness of breath: Secondary | ICD-10-CM

## 2018-01-01 DIAGNOSIS — Z87891 Personal history of nicotine dependence: Secondary | ICD-10-CM

## 2018-01-01 DIAGNOSIS — R05 Cough: Secondary | ICD-10-CM | POA: Diagnosis not present

## 2018-01-01 DIAGNOSIS — R059 Cough, unspecified: Secondary | ICD-10-CM

## 2018-01-01 LAB — PULMONARY FUNCTION TEST
DL/VA % PRED: 118 %
DL/VA: 5.56 ml/min/mmHg/L
DLCO UNC: 23.55 ml/min/mmHg
DLCO unc % pred: 68 %
FEF 25-75 POST: 5.01 L/s
FEF 25-75 Pre: 0.84 L/sec
FEF2575-%Change-Post: 495 %
FEF2575-%PRED-POST: 163 %
FEF2575-%Pred-Pre: 27 %
FEV1-%Change-Post: 90 %
FEV1-%PRED-PRE: 30 %
FEV1-%Pred-Post: 58 %
FEV1-PRE: 1.02 L
FEV1-Post: 1.95 L
FEV1FVC-%CHANGE-POST: 7 %
FEV1FVC-%Pred-Pre: 99 %
FEV6-%Change-Post: 76 %
FEV6-%Pred-Post: 56 %
FEV6-%Pred-Pre: 32 %
FEV6-Post: 2.32 L
FEV6-Pre: 1.32 L
FEV6FVC-%Pred-Post: 104 %
FEV6FVC-%Pred-Pre: 104 %
FVC-%Change-Post: 76 %
FVC-%PRED-PRE: 31 %
FVC-%Pred-Post: 54 %
FVC-POST: 2.33 L
FVC-PRE: 1.32 L
PRE FEV1/FVC RATIO: 78 %
Post FEV1/FVC ratio: 84 %
Post FEV6/FVC ratio: 100 %
Pre FEV6/FVC Ratio: 100 %
RV % pred: 106 %
RV: 2.48 L
TLC % pred: 69 %
TLC: 5.07 L

## 2018-01-01 LAB — NITRIC OXIDE: Nitric Oxide: 13

## 2018-01-01 MED ORDER — TIOTROPIUM BROMIDE MONOHYDRATE 2.5 MCG/ACT IN AERS: 2.0000 | INHALATION_SPRAY | Freq: Every day | RESPIRATORY_TRACT | 0 refills | Status: DC

## 2018-01-01 MED ORDER — TIOTROPIUM BROMIDE MONOHYDRATE 2.5 MCG/ACT IN AERS: 2.0000 | INHALATION_SPRAY | Freq: Every day | RESPIRATORY_TRACT | 6 refills | Status: DC

## 2018-01-01 NOTE — Patient Instructions (Signed)
Continue the Symbicort.  We will add Spiriva inhaler Continue using the flutter wall and Mucinex We will schedule you for low-dose screening CT of the chest Follow-up in 3 months.

## 2018-01-01 NOTE — Progress Notes (Signed)
[name]    [id number]    1956-07-27  Primary Care Physician:Avbuere, Dorma Russell, MD  Referring Physician: Fleet Contras, MD [address] [place], [place] [address]  Chief complaint:  Follow up for chronic bronchitis, emphysema  HPI: 62 year old with history of diabetes, hypertension, hyperlipidemia, OSA, coronary artery disease.  He has complains of cough with congestion for the past 1 year.  Sputum is yellow in color.  Denies any fevers, chills.  This is associated with dyspnea with activity, wheezing.  He saw his primary care Dr. [name] and was given pills and inhaler.  The patient does not recall the name He is using Mucinex over-the-counter.  Pets: Dog, no cats, birds, 5 monos Occupation: Financial planner Exposures: No known exposures Smoking history: 20-pack-year smoking history.  Quit November 2018 Travel History: Not significant  Interim history: He continues on Symbicort.  Complains of chronic cough with sputum production, wheezing. Denies any fevers, chills.  Outpatient Encounter Medications as of 01/01/2018  Medication Sig  . aspirin EC 81 MG tablet Take 81 mg by mouth daily.  Marland Kitchen atorvastatin (LIPITOR) 80 MG tablet TAKE 1 TABLET BY MOUTH ONCE DAILY.  . budesonide-formoterol (SYMBICORT) 160-4.5 MCG/ACT inhaler Inhale 2 puffs into the lungs 2 (two) times daily.  . canagliflozin (INVOKANA) 300 MG TABS tablet Take 300 mg by mouth daily before breakfast.  . dipyridamole-aspirin (AGGRENOX) 200-25 MG per 12 hr capsule Take 1 capsule by mouth 2 (two) times daily.  . fluticasone (FLONASE) 50 MCG/ACT nasal spray 2 sprays by Each Nare route daily.  Marland Kitchen gabapentin (NEURONTIN) 400 MG capsule Take 400 mg by mouth 3 (three) times daily.  . Insulin Glargine (LANTUS SOLOSTAR) 100 UNIT/ML Solostar Pen Inject 10 Units into the skin daily at 10 pm.  . lisinopril-hydrochlorothiazide (PRINZIDE,ZESTORETIC) 20-12.5 MG tablet TAKE 1 TABLET BY MOUTH ONCE DAILY.  .  metoprolol succinate (TOPROL-XL) 50 MG 24 hr tablet TAKE 1 AND 1/2 TABLETS BY MOUTH ONCE DAILY.  Marland Kitchen Respiratory Therapy Supplies (FLUTTER) DEVI Use as directed  . cetirizine (ZYRTEC) 10 MG tablet Take 10 mg by mouth daily.  . ergocalciferol (VITAMIN D2) 50000 units capsule Take 50,000 Units by mouth once a week.  . Glycopyrrolate-Formoterol (BEVESPI AEROSPHERE) 9-4.8 MCG/ACT AERO Inhale 2 puffs into the lungs 2 (two) times daily. (Patient not taking: Reported on 01/01/2018)   No facility-administered encounter medications on file as of 01/01/2018.     Allergies as of 01/01/2018  . (No Known Allergies)    Past Medical History:  Diagnosis Date  . Coronary artery disease   . Diabetes mellitus   . Diastolic heart failure   . Hyperlipidemia   . Hypertension   . Obesity   . OSA on CPAP     Past Surgical History:  Procedure Laterality Date  . CARDIAC CATHETERIZATION    . NO PAST SURGERIES      Family History  Problem Relation Age of Onset  . Diabetes Father   . Hypertension Father     Social History   Socioeconomic History  . Marital status: Single    Spouse name: Not on file  . Number of children: Not on file  . Years of education: Not on file  . Highest education level: Not on file  Social Needs  . Financial resource strain: Not on file  . Food insecurity - worry: Not on file  . Food insecurity - inability: Not on file  . Transportation needs - medical: Not  on file  . Transportation needs - non-medical: Not on file  Occupational History  . Not on file  Tobacco Use  . Smoking status: Former Smoker    Packs/day: 1.00    Years: 15.00    Pack years: 15.00    Types: Cigarettes    Last attempt to quit: 09/2017    Years since quitting: 0.3  . Smokeless tobacco: Never Used  . Tobacco comment: Down to a cigarette per week  Substance and Sexual Activity  . Alcohol use: Yes    Alcohol/week: 2.4 oz    Types: 2 Cans of beer, 2 Shots of liquor per week    Comment: occ  .  Drug use: No  . Sexual activity: No  Other Topics Concern  . Not on file  Social History Narrative  . Not on file    Review of systems: Review of Systems  Constitutional: Negative for fever and chills.  HENT: Negative.   Eyes: Negative for blurred vision.  Respiratory: as per HPI  Cardiovascular: Negative for chest pain and palpitations.  Gastrointestinal: Negative for vomiting, diarrhea, blood per rectum. Genitourinary: Negative for dysuria, urgency, frequency and hematuria.  Musculoskeletal: Negative for myalgias, back pain and joint pain.  Skin: Negative for itching and rash.  Neurological: Negative for dizziness, tremors, focal weakness, seizures and loss of consciousness.  Endo/Heme/Allergies: Negative for environmental allergies.  Psychiatric/Behavioral: Negative for depression, suicidal ideas and hallucinations.  All other systems reviewed and are negative.  Physical Exam: Blood pressure 126/70, pulse 72, height 5' 11.5" (1.816 m), weight 276 lb 9.6 oz (125.5 kg), SpO2 96 %. Gen:      No acute distress HEENT:  EOMI, sclera anicteric Neck:     No masses; no thyromegaly Lungs:    Clear to auscultation bilaterally; normal respiratory effort CV:         Regular rate and rhythm; no murmurs Abd:      + bowel sounds; soft, non-tender; no palpable masses, no distension Ext:    No edema; adequate peripheral perfusion Skin:      Warm and dry; no rash Neuro: alert and oriented x 3 Psych: normal mood and affect  Data Reviewed: Chest x-ray 06/28/17-prominent interstitial markings hyperinflated lungs, left hemidiaphragm elevation Chest x-ray 10/13/17- no acute cardiopulmonary process, stable left hemidiaphragm elevation. I have reviewed the images personally.  PFTs 01/01/18 Pre-bronchodilator FVC 1.32 [31%], FEV1 1.02 [30%, F/F 78, Post bronchodilator FVC 2.33 (2 4%], FEV1 1.95 (58%], F/F 84 TLC 69%, DLCO 68% Minimal obstruction with marked bronchodilator response, mild  restriction and mild diffusion defect.  FENO 01/01/18- 13  Assessment:  Chronic bronchitis, emphysema PFTs reviewed which shows minimal obstruction with reduction in mid flow rates. There is impressive improvement in flow rates after inhaler therapy He currently on Symbicort.  We will add Spiriva inhaler Continue flutter wall and Mucinex for clearance of secretion.  Ex-smoker Refer for low-dose screening CT.   Plan/Recommendations: - Continue Symbicort, add Spiriva - Flutter valve and Mucinex for clearance of secretion - Low-dose screening CT of chest   Chilton GreathousePraveen Liandro Thelin MD [facility] Pager [phone] 01/01/2018, 11:38 AM  CC: Fleet ContrasAvbuere, Edwin, MD

## 2018-01-01 NOTE — Progress Notes (Signed)
PFT done today. 

## 2018-01-03 ENCOUNTER — Telehealth: Payer: Self-pay | Admitting: Pulmonary Disease

## 2018-01-03 NOTE — Telephone Encounter (Signed)
Initiated PA for Spiriva Respimat 2.5 via [employer]. The phone number for [employer] is [phone] and the PA# [id number]. The [employer] representative said it may take several days for a decision to be made.  Will route to [facility] to follow up on.

## 2018-01-05 MED ORDER — TIOTROPIUM BROMIDE MONOHYDRATE 18 MCG IN CAPS: 18.0000 ug | ORAL_CAPSULE | Freq: Every day | RESPIRATORY_TRACT | 6 refills | Status: DC

## 2018-01-05 NOTE — Telephone Encounter (Signed)
Pt is aware of below message and voiced his understanding.  Rx for Spiriva handihaler has been sent to preferred pharmacy. Nothing further is needed.

## 2018-01-05 NOTE — Telephone Encounter (Signed)
Please try Spiriva HandiHaler. Thanks

## 2018-01-05 NOTE — Telephone Encounter (Signed)
Called pt letting him know we were switching him from spiriva respimat to spiriva handihaler due to the respimat not being covered by pt's insurance and he has to try and fail either spiriva handihaler or stiolto.  Pt expressed understanding. Rx sent to pt's preferred pharmacy.  Nothing further needed at this current time.

## 2018-01-05 NOTE — Telephone Encounter (Signed)
PM---  PA for the Spiriva respimat was denied by the pts insurance due to the pt needing to try and fail either the Spiriva HandiHaler or the stiolto. Please advise on which medication you would like to change the pt to.  thanks

## 2018-01-08 ENCOUNTER — Telehealth: Payer: Self-pay | Admitting: Pulmonary Disease

## 2018-01-08 NOTE — Telephone Encounter (Signed)
Noted. Thanks.

## 2018-01-08 NOTE — Telephone Encounter (Signed)
FYI for Dr Isaiah SergeMannam - you referred this pt for lung cancer screening.  Pt has medicaid and they do not cover this.  [facility] [employer] [facility] [employer] patients.  Pt has agreed to have screening there.  I have sent them a referral.

## 2018-01-25 ENCOUNTER — Other Ambulatory Visit: Payer: Self-pay | Admitting: Cardiology

## 2018-01-30 ENCOUNTER — Telehealth: Payer: Self-pay | Admitting: Pulmonary Disease

## 2018-01-30 NOTE — Telephone Encounter (Signed)
Spoke with pt. States that he was supposed to be referred to an ENT. I do not see this documented anywhere.  Dr. Isaiah SergeMannam - please advise if you want this referral made? Thanks.

## 2018-01-30 NOTE — Telephone Encounter (Signed)
I dont recall mentioning a ENT referral at last visit. The referral we did was for screening CT of the chest [facility]see previous note). Please check the status of this.

## 2018-01-31 NOTE — Telephone Encounter (Signed)
Spoke with patient in regards to the ENT referral. Patient thought that the lung cancer screening program referral was for the ENT. Patient stated that he has not received any calls or information regarding his appt.   [facility]ed [facility] and left a message for Britta MccreedyBarbara to call back. Need to see if the patient has already been scheduled.   Will leave this encounter open until she calls back.

## 2018-01-31 NOTE — Telephone Encounter (Signed)
Britta MccreedyBarbara called back. She was unsure if the patient needed to be scheduled via central scheduling or not. Was transferred over to central scheduling while I remained on the line for over 10 mins.   Will call back later.

## 2018-02-01 NOTE — Telephone Encounter (Signed)
Called and spoke with pt who stated to me as he was driving, his phone cut off as he was trying to speak with the people from [facility].  Pt stated to me he has been trying to get through to them on the number that came through on his phone which was [phone] but has not been able to get anyone.  Told pt to try to call the main number of [phone] to see if he could get through on the main number.  Also stated to pt if he was not able to get through to call our office back and we could see how we could help him at that point.  Pt expressed understanding. Nothing further needed at this time.  Routing back to Dr. Shirlee MoreMannam's box.

## 2018-02-01 NOTE — Telephone Encounter (Signed)
Called [facility] Central Sched[facility] and spoke with Marchelle Folksmanda to see if they had received the order from us we had placed for pt regarding referral for the CT scan.  Marchelle Folksmanda stated to me they did receive the order but they had been unable to reach pt and that was what the hold up was.  Stated to Ro[place] SpringAmanda I would try to contact pt to see if I could reach him to have him call them and I also stated to Marchelle Folksmanda to try to reach out to him again as well.  Called pt and was able to get pt letting him know [facility] had been trying to call him. Pt was driving and was unable to write down the phone number to call [facility], but while I was speaking to pt, pt was getting another call.  Pt looked at the number that was trying to get through and he read the number to me that was trying to call him.  I stated to pt that number was [facility] and I stated to him I was going to hang up so he could speak to them to get everything scheduled for pt.  Will route this message to Dr. Isaiah SergeMannam for an [place]I.

## 2018-02-01 NOTE — Telephone Encounter (Signed)
Pt is calling back [phone]

## 2018-03-01 ENCOUNTER — Ambulatory Visit (INDEPENDENT_AMBULATORY_CARE_PROVIDER_SITE_OTHER): Payer: Medicaid Other | Admitting: Cardiology

## 2018-03-01 ENCOUNTER — Encounter: Payer: Self-pay | Admitting: Cardiology

## 2018-03-01 VITALS — BP 110/72 | HR 71 | Ht 73.0 in | Wt 278.0 lb

## 2018-03-01 DIAGNOSIS — I2510 Atherosclerotic heart disease of native coronary artery without angina pectoris: Secondary | ICD-10-CM

## 2018-03-01 DIAGNOSIS — E782 Mixed hyperlipidemia: Secondary | ICD-10-CM | POA: Diagnosis not present

## 2018-03-01 DIAGNOSIS — I10 Essential (primary) hypertension: Secondary | ICD-10-CM | POA: Diagnosis not present

## 2018-03-01 DIAGNOSIS — Z8673 Personal history of transient ischemic attack (TIA), and cerebral infarction without residual deficits: Secondary | ICD-10-CM

## 2018-03-01 NOTE — Patient Instructions (Signed)
Your physician wants you to follow-up in: 1 year with Dr.Branch You will receive a reminder letter in the mail two months in advance. If you don't receive a letter, please call our office to schedule the follow-up appointment.    Your physician recommends that you continue on your current medications as directed. Please refer to the Current Medication list given to you today.    If you need a refill on your cardiac medications before your next appointment, please call your pharmacy.     No lab work or tests ordered today.      Thank you for choosing [facility] !            

## 2018-03-01 NOTE — Progress Notes (Signed)
Clinical Summary [name] is a 62 y.o.male  seen today for follow up of the following medical problems.   1. 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  - echo 07/2011 LVEF 45-50% with inferior hypokinesis.    - no chest pain, no SOB or DOE - compliant with meds.  2. HTN - he is compliant with meds  3. Hyperlipidemia -12/2016 TC 119 TG 125 HDL 47 LDL 47 - regular labs done by pcp - compliant with meds  4. History of CVA - on aggrenox for secondary prevention. Also on statin, ACE-I =- no recent symptoms     Past Medical History:  Diagnosis Date  . Coronary artery disease   . Diabetes mellitus   . Diastolic heart failure   . Hyperlipidemia   . Hypertension   . Obesity   . OSA on CPAP      No Known Allergies   Current Outpatient Medications  Medication Sig Dispense Refill  . aspirin EC 81 MG tablet Take 81 mg by mouth daily.    Marland Kitchen atorvastatin (LIPITOR) 80 MG tablet TAKE 1 TABLET BY MOUTH ONCE DAILY. 30 tablet 6  . budesonide-formoterol (SYMBICORT) 160-4.5 MCG/ACT inhaler Inhale 2 puffs into the lungs 2 (two) times daily. 1 Inhaler 6  . canagliflozin (INVOKANA) 300 MG TABS tablet Take 300 mg by mouth daily before breakfast.    . cetirizine (ZYRTEC) 10 MG tablet Take 10 mg by mouth daily.    Marland Kitchen dipyridamole-aspirin (AGGRENOX) 200-25 MG per 12 hr capsule Take 1 capsule by mouth 2 (two) times daily.    . ergocalciferol (VITAMIN D2) 50000 units capsule Take 50,000 Units by mouth once a week.    . fluticasone (FLONASE) 50 MCG/ACT nasal spray 2 sprays by Each Nare route daily.    Marland Kitchen gabapentin (NEURONTIN) 400 MG capsule Take 400 mg by mouth 3 (three) times daily.    . Glycopyrrolate-Formoterol (BEVESPI AEROSPHERE) 9-4.8 MCG/ACT AERO Inhale 2 puffs into the lungs 2 (two) times daily. (Patient not taking: Reported on 01/01/2018) 1 Inhaler 5  . Insulin Glargine (LANTUS SOLOSTAR) 100 UNIT/ML Solostar Pen Inject 10 Units into the skin  daily at 10 pm.    . lisinopril-hydrochlorothiazide (PRINZIDE,ZESTORETIC) 20-12.5 MG tablet TAKE 1 TABLET BY MOUTH ONCE DAILY. 30 tablet 6  . lisinopril-hydrochlorothiazide (PRINZIDE,ZESTORETIC) 20-12.5 MG tablet TAKE 1 TABLET BY MOUTH ONCE DAILY. 30 tablet 6  . metoprolol succinate (TOPROL-XL) 50 MG 24 hr tablet TAKE 1 AND 1/2 TABLETS BY MOUTH ONCE DAILY. 135 tablet 3  . Respiratory Therapy Supplies (FLUTTER) DEVI Use as directed 1 each 0  . tiotropium (SPIRIVA) 18 MCG inhalation capsule Place 1 capsule (18 mcg total) into inhaler and inhale daily. 30 capsule 6   No current facility-administered medications for this visit.      Past Surgical History:  Procedure Laterality Date  . CARDIAC CATHETERIZATION    . NO PAST SURGERIES       No Known Allergies    Family History  Problem Relation Age of Onset  . Diabetes Father   . Hypertension Father      Social History [name] reports that he quit smoking about 5 months ago. His smoking use included cigarettes. He has a 15.00 pack-year smoking history. He has never used smokeless tobacco. [name] reports that he drinks about 2.4 oz of alcohol per week.   Review of Systems CONSTITUTIONAL: No weight loss, fever, chills, weakness or fatigue.  HEENT:  Eyes: No visual loss, blurred vision, double vision or yellow sclerae.No hearing loss, sneezing, congestion, runny nose or sore throat.  SKIN: No rash or itching.  CARDIOVASCULAR: per hpi RESPIRATORY: No shortness of breath, cough or sputum.  GASTROINTESTINAL: No anorexia, nausea, vomiting or diarrhea. No abdominal pain or blood.  GENITOURINARY: No burning on urination, no polyuria NEUROLOGICAL: No headache, dizziness, syncope, paralysis, ataxia, numbness or tingling in the extremities. No change in bowel or bladder control.  MUSCULOSKELETAL: No muscle, back pain, joint pain or stiffness.  LYMPHATICS: No enlarged nodes. No history of splenectomy.  PSYCHIATRIC: No history of  depression or anxiety.  ENDOCRINOLOGIC: No reports of sweating, cold or heat intolerance. No polyuria or polydipsia.  Marland Kitchen.   Physical Examination Vitals:   03/01/18 0828  BP: 110/72  Pulse: 71  SpO2: 95%   Vitals:   03/01/18 0828  Weight: 278 lb (126.1 kg)  Height: 6\' 1"  (1.854 m)    Gen: resting comfortably, no acute distress HEENT: no scleral icterus, pupils equal round and reactive, no palptable cervical adenopathy,  CV Resp: Clear to auscultation bilaterally GI: abdomen is soft, non-tender, non-distended, normal bowel sounds, no hepatosplenomegaly MSK: extremities are warm, no edema.  Skin: warm, no rash Neuro:  no focal deficits Psych: appropriate affect   Diagnostic Studies  07/2011 Echo LVEF 45-50%, mod biatrial enlargement, diastolic function not described.   05/2012 Carotid US IMPRESSION: Bilateral atherosclerosis, not resulting in hemodynamically significant stenosis.  Jan 2016 ABI IMPRESSION: Normal ABIs at rest. Nonocclusive peripheral vascular disease, noncompressible in the left thigh and calf regions.    Assessment and Plan  1. CAD - no symptoms, continue current meds - EKG today shows SR, RBBB, no acute ischemic changes.   2. HTN At goal, continue current meds  3. Hyperlipidemia - request pcp labs, continue statin  4. History of CVA - no recent symptoms, continue secondary prevetnion  F/u 1 year       [name], M.D.

## 2018-03-02 ENCOUNTER — Other Ambulatory Visit: Payer: Self-pay | Admitting: Cardiology

## 2018-03-07 ENCOUNTER — Encounter: Payer: Self-pay | Admitting: Cardiology

## 2018-03-20 ENCOUNTER — Telehealth: Payer: Self-pay | Admitting: Pulmonary Disease

## 2018-03-20 NOTE — Telephone Encounter (Signed)
Called and spoke to pt.  Pt states he had CT low dose last week at [facility].  Pt wanted to be sure that Dr. Isaiah Serge would be able to review imaging at his upcoming apt on 04/03/18. I have contacted [facility] and requested that report be faxed to our office.  Pt is aware that records have been requesting.  Will hold message in triage until report is received.

## 2018-03-21 NOTE — Telephone Encounter (Signed)
We have access to [facility] imaging via PACS. Spoke to pt to make him aware that we have access to these records.  Nothing further needed.

## 2018-04-03 ENCOUNTER — Encounter: Payer: Self-pay | Admitting: Pulmonary Disease

## 2018-04-03 ENCOUNTER — Other Ambulatory Visit (INDEPENDENT_AMBULATORY_CARE_PROVIDER_SITE_OTHER): Payer: Medicaid Other

## 2018-04-03 ENCOUNTER — Ambulatory Visit: Payer: Medicaid Other | Admitting: Pulmonary Disease

## 2018-04-03 VITALS — BP 110/60 | HR 59 | Ht 72.0 in | Wt 279.8 lb

## 2018-04-03 DIAGNOSIS — R0602 Shortness of breath: Secondary | ICD-10-CM

## 2018-04-03 LAB — CBC WITH DIFFERENTIAL/PLATELET
BASOS ABS: 0 10*3/uL (ref 0.0–0.1)
BASOS PCT: 0.7 % (ref 0.0–3.0)
EOS ABS: 0.2 10*3/uL (ref 0.0–0.7)
Eosinophils Relative: 3 % (ref 0.0–5.0)
HEMATOCRIT: 39 % (ref 39.0–52.0)
HEMOGLOBIN: 12.9 g/dL — AB (ref 13.0–17.0)
LYMPHS PCT: 45.1 % (ref 12.0–46.0)
Lymphs Abs: 2.4 10*3/uL (ref 0.7–4.0)
MCHC: 33.1 g/dL (ref 30.0–36.0)
MCV: 89.3 fl (ref 78.0–100.0)
MONOS PCT: 9.8 % (ref 3.0–12.0)
Monocytes Absolute: 0.5 10*3/uL (ref 0.1–1.0)
NEUTROS ABS: 2.2 10*3/uL (ref 1.4–7.7)
Neutrophils Relative %: 41.4 % — ABNORMAL LOW (ref 43.0–77.0)
PLATELETS: 187 10*3/uL (ref 150.0–400.0)
RBC: 4.37 Mil/uL (ref 4.22–5.81)
RDW: 16.6 % — ABNORMAL HIGH (ref 11.5–15.5)
WBC: 5.4 10*3/uL (ref 4.0–10.5)

## 2018-04-03 MED ORDER — BUDESONIDE-FORMOTEROL FUMARATE 160-4.5 MCG/ACT IN AERO: 2.0000 | INHALATION_SPRAY | Freq: Two times a day (BID) | RESPIRATORY_TRACT | 6 refills | Status: DC

## 2018-04-03 MED ORDER — BUDESONIDE-FORMOTEROL FUMARATE 160-4.5 MCG/ACT IN AERO: 2.0000 | INHALATION_SPRAY | Freq: Two times a day (BID) | RESPIRATORY_TRACT | 0 refills | Status: DC

## 2018-04-03 NOTE — Addendum Note (Signed)
Addended by: Etheleen Mayhew C on: 04/03/2018 09:48 AM   Modules accepted: Orders

## 2018-04-03 NOTE — Patient Instructions (Signed)
We will get some blood test today including CBC differential, blood allergy profile, alpha 1 antitrypsin levels and phenotype We did renew your prescription for Symbicort.  Continue the Spiriva Please make sure that you start using the CPAP for sleep apnea Continue to work on smoking cessation  Follow-up in 6 months.

## 2018-04-03 NOTE — Progress Notes (Addendum)
[name]    [id number]    06/26/1956  Primary Care Physician:Avbuere, Dorma Russell, MD  Referring Physician: Fleet Contras, MD [address] [place], [place] [address]  Chief complaint:  Follow up for chronic bronchitis, emphysema  HPI: 62 year old with history of diabetes, hypertension, hyperlipidemia, OSA, coronary artery disease.  He has complains of cough with congestion for the past 1 year.  Sputum is yellow in color.  Denies any fevers, chills.  This is associated with dyspnea with activity, wheezing.  He saw his primary care Dr. [name] and was given pills and inhaler.  The patient does not recall the name He is using Mucinex over-the-counter.  Pets: Dog, no cats, birds, 5 monos Occupation: Financial planner Exposures: No known exposures Smoking history: 20-pack-year smoking history.  Quit November 2018 Travel History: Not significant  Interim history: Has started smoking again after quitting for several months Complains of chronic dyspnea, cough, congestion at baseline.  Ran out of Symbicort 3 months ago and did not get a refill.  Continues on Spiriva.  Outpatient Encounter Medications as of 04/03/2018  Medication Sig  . aspirin EC 81 MG tablet Take 81 mg by mouth daily.  Marland Kitchen atorvastatin (LIPITOR) 80 MG tablet TAKE 1 TABLET BY MOUTH ONCE DAILY.  . budesonide-formoterol (SYMBICORT) 160-4.5 MCG/ACT inhaler Inhale 2 puffs into the lungs 2 (two) times daily.  . canagliflozin (INVOKANA) 300 MG TABS tablet Take 300 mg by mouth daily before breakfast.  . cetirizine (ZYRTEC) 10 MG tablet Take 10 mg by mouth daily.  Marland Kitchen dipyridamole-aspirin (AGGRENOX) 200-25 MG per 12 hr capsule Take 1 capsule by mouth 2 (two) times daily.  . fluticasone (FLONASE) 50 MCG/ACT nasal spray 2 sprays by Each Nare route daily.  Marland Kitchen gabapentin (NEURONTIN) 400 MG capsule Take 400 mg by mouth 3 (three) times daily.  . Insulin Glargine (LANTUS SOLOSTAR) 100 UNIT/ML Solostar Pen Inject  10 Units into the skin daily at 10 pm.  . lisinopril-hydrochlorothiazide (PRINZIDE,ZESTORETIC) 20-12.5 MG tablet TAKE 1 TABLET BY MOUTH ONCE DAILY.  . metoprolol succinate (TOPROL-XL) 50 MG 24 hr tablet TAKE 1 AND 1/2 TABLETS BY MOUTH ONCE DAILY.  Marland Kitchen Respiratory Therapy Supplies (FLUTTER) DEVI Use as directed  . tiotropium (SPIRIVA) 18 MCG inhalation capsule Place 1 capsule (18 mcg total) into inhaler and inhale daily.  . [DISCONTINUED] ergocalciferol (VITAMIN D2) 50000 units capsule Take 50,000 Units by mouth once a week.  . [DISCONTINUED] Glycopyrrolate-Formoterol (BEVESPI AEROSPHERE) 9-4.8 MCG/ACT AERO Inhale 2 puffs into the lungs 2 (two) times daily.  . [DISCONTINUED] lisinopril-hydrochlorothiazide (PRINZIDE,ZESTORETIC) 20-12.5 MG tablet TAKE 1 TABLET BY MOUTH ONCE DAILY.   No facility-administered encounter medications on file as of 04/03/2018.     Allergies as of 04/03/2018  . (No Known Allergies)    Past Medical History:  Diagnosis Date  . Coronary artery disease   . Diabetes mellitus   . Diastolic heart failure   . Hyperlipidemia   . Hypertension   . Obesity   . OSA on CPAP     Past Surgical History:  Procedure Laterality Date  . CARDIAC CATHETERIZATION    . NO PAST SURGERIES      Family History  Problem Relation Age of Onset  . Diabetes Father   . Hypertension Father     Social History   Socioeconomic History  . Marital status: Single    Spouse name: Not on file  . Number of children: Not on file  . Years of  education: Not on file  . Highest education level: Not on file  Occupational History  . Not on file  Social Needs  . Financial resource strain: Not on file  . Food insecurity:    Worry: Not on file    Inability: Not on file  . Transportation needs:    Medical: Not on file    Non-medical: Not on file  Tobacco Use  . Smoking status: Current Every Day Smoker    Packs/day: 1.00    Years: 15.00    Pack years: 15.00    Types: Cigarettes  .  Smokeless tobacco: Never Used  . Tobacco comment: Down to a cigarette per week  Substance and Sexual Activity  . Alcohol use: Yes    Alcohol/week: 2.4 oz    Types: 2 Cans of beer, 2 Shots of liquor per week    Comment: occ  . Drug use: No  . Sexual activity: Never  Lifestyle  . Physical activity:    Days per week: Not on file    Minutes per session: Not on file  . Stress: Not on file  Relationships  . Social connections:    Talks on phone: Not on file    Gets together: Not on file    Attends religious service: Not on file    Active member of club or organization: Not on file    Attends meetings of clubs or organizations: Not on file    Relationship status: Not on file  . Intimate partner violence:    Fear of current or ex partner: Not on file    Emotionally abused: Not on file    Physically abused: Not on file    Forced sexual activity: Not on file  Other Topics Concern  . Not on file  Social History Narrative  . Not on file    Review of systems: Review of Systems  Constitutional: Negative for fever and chills.  HENT: Negative.   Eyes: Negative for blurred vision.  Respiratory: as per HPI  Cardiovascular: Negative for chest pain and palpitations.  Gastrointestinal: Negative for vomiting, diarrhea, blood per rectum. Genitourinary: Negative for dysuria, urgency, frequency and hematuria.  Musculoskeletal: Negative for myalgias, back pain and joint pain.  Skin: Negative for itching and rash.  Neurological: Negative for dizziness, tremors, focal weakness, seizures and loss of consciousness.  Endo/Heme/Allergies: Negative for environmental allergies.  Psychiatric/Behavioral: Negative for depression, suicidal ideas and hallucinations.  All other systems reviewed and are negative.  Physical Exam: Blood pressure 110/60, pulse (!) 59, height 6' (1.829 m), weight 279 lb 12.8 oz (126.9 kg), SpO2 99 %. Gen:      No acute distress HEENT:  EOMI, sclera anicteric Neck:     No  masses; no thyromegaly Lungs:    Clear to auscultation bilaterally; normal respiratory effort CV:         Regular rate and rhythm; no murmurs Abd:      + bowel sounds; soft, non-tender; no palpable masses, no distension Ext:    No edema; adequate peripheral perfusion Skin:      Warm and dry; no rash Neuro: alert and oriented x 3 Psych: normal mood and affect  Data Reviewed: Chest x-ray 06/28/17-prominent interstitial markings hyperinflated lungs, left hemidiaphragm elevation Chest x-ray 10/13/17- no acute cardiopulmonary process, stable left hemidiaphragm elevation. Screening CT chest 02/09/2018- no pulmonary nodule or mass.  Aortic atherosclerosis.  Pulmonary artery enlargement suggestive of hypertension. I have reviewed the images personally.  PFTs 01/01/18 Pre-bronchodilator FVC 1.32 [31%],  FEV1 1.02 [30%, F/F 78, Post bronchodilator FVC 2.33 (2 4%], FEV1 1.95 (58%], F/F 84 TLC 69%, DLCO 68% Minimal obstruction with marked bronchodilator response, mild restriction and mild diffusion defect.  FENO 01/01/18- 13  Assessment:  Chronic bronchitis, emphysema Continues Spiriva.  Of Symbicort since he did not get a refill.  Will call in a prescription again PFTs reviewed which shows minimal obstruction with reduction in mid flow rates. There is impressive improvement in flow rates after inhaler therapy.  Check CBC differential, blood allergy profile, alpha-1 antitrypsin level. Continue flutter valve and Mucinex for clearance of secretion.  Active smoker Encouraged to quit again. Screening CT reviewed with no lung nodule.  OSA Compliant with CPAP.  No suggestion of pulmonary hypertension on CT scan I have encouraged him to start chemotherapy  Health maintenance Influenza 07/27/2016 He is not interested in getting any vaccines as he had a reaction in the past.  Plan/Recommendations: - Continue Spiriva, continue Symbicort - Flutter valve and Mucinex for clearance of secretion - Continue  low-dose screening CT of chest  - Smoking cessation  Chilton Greathouse MD [facility] 04/03/2018, 9:20 AM  CC: Fleet Contras, MD

## 2018-04-09 LAB — RESPIRATORY ALLERGY PROFILE REGION II ~~LOC~~
ALLERGEN, COMM SILVER BIRCH, T3: 0.83 kU/L — AB
ALLERGEN, COTTONWOOD, T14: 0.96 kU/L — AB
Allergen, Cedar tree, t12: 0.75 kU/L — ABNORMAL HIGH
Allergen, D pternoyssinus,d7: 0.1 kU/L — ABNORMAL HIGH
Allergen, Mouse Urine Protein, e78: <0.1 kU/L
Allergen, Mulberry, t76: 0.81 kU/L — ABNORMAL HIGH
Allergen, Oak,t7: 0.9 kU/L — ABNORMAL HIGH
Aspergillus fumigatus, m3: <0.1 kU/L
BERMUDA GRASS: 0.93 kU/L — AB
Box Elder IgE: 0.9 kU/L — ABNORMAL HIGH
CLASS: 0
CLASS: 0
CLASS: 0
CLASS: 0
CLASS: 0
CLASS: 0
CLASS: 2
CLASS: 2
CLASS: 2
CLASS: 2
CLASS: 2
CLASS: 2
CLASS: 2
CLASS: 2
CLASS: 2
COMMON RAGWEED (SHORT) (W1) IGE: 0.95 kU/L — AB
Cat Dander: <0.1 kU/L
Class: 0
Class: 0
Class: 0
Class: 2
Class: 2
Class: 2
Class: 2
Class: 2
Class: 2
Cockroach: 0.78 kU/L — ABNORMAL HIGH
D. farinae: 0.3 kU/L — ABNORMAL HIGH
DOG DANDER: 0.16 kU/L — AB
Elm IgE: 0.95 kU/L — ABNORMAL HIGH
IGE (IMMUNOGLOBULIN E), SERUM: 44 kU/L (ref <=114)
JOHNSON GRASS: 0.86 kU/L — AB
PECAN/HICKORY TREE IGE: 0.81 kU/L — AB
Rough Pigweed  IgE: 0.81 kU/L — ABNORMAL HIGH
SHEEP SORREL IGE: 0.86 kU/L — AB
Timothy Grass: 0.93 kU/L — ABNORMAL HIGH

## 2018-04-09 LAB — INTERPRETATION:

## 2018-04-09 LAB — ALPHA-1 ANTITRYPSIN PHENOTYPE: A-1 Antitrypsin, Ser: 148 mg/dL (ref 83–199)

## 2018-04-13 ENCOUNTER — Telehealth: Payer: Self-pay | Admitting: Acute Care

## 2018-04-13 NOTE — Telephone Encounter (Signed)
Please call patient and let them  know their  low dose Ct was read as a Lung RADS 1, negative study: no nodules or definitely benign nodules. Radiology recommendation is for a repeat LDCT in 12 months..Please let them  know we will order and schedule their  annual screening scan for 02/2019.Please remind him that we are unsure of grant money from year to year .  Please let them  know there was notation of CAD on their  scan.  Please remind the patient  that this is a non-gated exam therefore degree or severity of disease  cannot be determined. Please have them  follow up with their PCP regarding potential risk factor modification, dietary therapy or pharmacologic therapy if clinically indicated. Pt.  is  currently on statin therapy. Please place order for annual  screening scan for  02/2019 through the [employer] grant program  and fax results to PCP. Thanks so much.

## 2018-04-16 NOTE — Telephone Encounter (Signed)
Pt informed of CT results per Kandice RobinsonsSarah Groce, NP.  PT verbalized understanding.  Copy sent to PCP.  Pt advised that we will contact him in 1 year to see if we can schedule f/u LDCT through [facility] grant.

## 2018-09-06 ENCOUNTER — Other Ambulatory Visit: Payer: Self-pay | Admitting: Cardiology

## 2018-10-09 ENCOUNTER — Other Ambulatory Visit: Payer: Self-pay | Admitting: Cardiology

## 2018-11-02 ENCOUNTER — Other Ambulatory Visit: Payer: Self-pay | Admitting: Cardiology

## 2018-11-05 ENCOUNTER — Other Ambulatory Visit: Payer: Self-pay | Admitting: Cardiology

## 2019-02-06 ENCOUNTER — Other Ambulatory Visit: Payer: Self-pay

## 2019-02-06 MED ORDER — ATORVASTATIN CALCIUM 80 MG PO TABS: 80.0000 mg | ORAL_TABLET | Freq: Every day | ORAL | 3 refills | Status: DC

## 2019-02-06 NOTE — Telephone Encounter (Signed)
Refilled atorvastatin per fax request 

## 2019-04-11 ENCOUNTER — Encounter (HOSPITAL_COMMUNITY): Payer: Self-pay | Admitting: Emergency Medicine

## 2019-04-11 ENCOUNTER — Ambulatory Visit: Payer: Medicaid Other | Admitting: Cardiology

## 2019-04-11 ENCOUNTER — Encounter: Payer: Self-pay | Admitting: Cardiology

## 2019-04-11 ENCOUNTER — Observation Stay (HOSPITAL_COMMUNITY)
Admission: EM | Admit: 2019-04-11 | Discharge: 2019-04-12 | Disposition: A | Discharge status: Home / Self Care | Payer: Medicaid Other | Attending: Internal Medicine | Admitting: Internal Medicine

## 2019-04-11 ENCOUNTER — Other Ambulatory Visit: Payer: Self-pay

## 2019-04-11 ENCOUNTER — Emergency Department (HOSPITAL_COMMUNITY): Payer: Medicaid Other

## 2019-04-11 DIAGNOSIS — N179 Acute kidney failure, unspecified: Secondary | ICD-10-CM | POA: Diagnosis not present

## 2019-04-11 DIAGNOSIS — I5030 Unspecified diastolic (congestive) heart failure: Secondary | ICD-10-CM | POA: Insufficient documentation

## 2019-04-11 DIAGNOSIS — M79672 Pain in left foot: Secondary | ICD-10-CM | POA: Diagnosis present

## 2019-04-11 DIAGNOSIS — F17210 Nicotine dependence, cigarettes, uncomplicated: Secondary | ICD-10-CM | POA: Diagnosis not present

## 2019-04-11 DIAGNOSIS — I110 Hypertensive heart disease with heart failure: Secondary | ICD-10-CM | POA: Insufficient documentation

## 2019-04-11 DIAGNOSIS — E11628 Type 2 diabetes mellitus with other skin complications: Secondary | ICD-10-CM

## 2019-04-11 DIAGNOSIS — Z20828 Contact with and (suspected) exposure to other viral communicable diseases: Secondary | ICD-10-CM | POA: Diagnosis not present

## 2019-04-11 DIAGNOSIS — R234 Changes in skin texture: Secondary | ICD-10-CM

## 2019-04-11 DIAGNOSIS — E119 Type 2 diabetes mellitus without complications: Secondary | ICD-10-CM

## 2019-04-11 DIAGNOSIS — Z720 Tobacco use: Secondary | ICD-10-CM | POA: Diagnosis not present

## 2019-04-11 DIAGNOSIS — Z7982 Long term (current) use of aspirin: Secondary | ICD-10-CM | POA: Diagnosis not present

## 2019-04-11 DIAGNOSIS — L089 Local infection of the skin and subcutaneous tissue, unspecified: Secondary | ICD-10-CM | POA: Diagnosis not present

## 2019-04-11 DIAGNOSIS — Z8673 Personal history of transient ischemic attack (TIA), and cerebral infarction without residual deficits: Secondary | ICD-10-CM | POA: Diagnosis not present

## 2019-04-11 DIAGNOSIS — E782 Mixed hyperlipidemia: Secondary | ICD-10-CM

## 2019-04-11 DIAGNOSIS — Z79899 Other long term (current) drug therapy: Secondary | ICD-10-CM | POA: Insufficient documentation

## 2019-04-11 DIAGNOSIS — I739 Peripheral vascular disease, unspecified: Secondary | ICD-10-CM

## 2019-04-11 DIAGNOSIS — N182 Chronic kidney disease, stage 2 (mild): Secondary | ICD-10-CM

## 2019-04-11 LAB — BASIC METABOLIC PANEL
Anion gap: 10 (ref 5–15)
BUN: 26 mg/dL — ABNORMAL HIGH (ref 6–20)
CO2: 23 mmol/L (ref 22–32)
Calcium: 9.1 mg/dL (ref 8.9–10.3)
Chloride: 105 mmol/L (ref 98–111)
Creatinine, Ser: 1.56 mg/dL — ABNORMAL HIGH (ref 0.61–1.24)
GFR calc Af Amer: 55 mL/min — ABNORMAL LOW (ref >60)
GFR calc non Af Amer: 48 mL/min — ABNORMAL LOW (ref >60)
Glucose, Bld: 121 mg/dL — ABNORMAL HIGH (ref 70–99)
Potassium: 4.8 mmol/L (ref 3.5–5.1)
Sodium: 138 mmol/L (ref 135–145)

## 2019-04-11 LAB — CBC WITH DIFFERENTIAL/PLATELET
Abs Immature Granulocytes: 0.01 10*3/uL (ref 0.00–0.07)
Basophils Absolute: 0 10*3/uL (ref 0.0–0.1)
Basophils Relative: 0 %
Eosinophils Absolute: 0.2 10*3/uL (ref 0.0–0.5)
Eosinophils Relative: 4 %
HCT: 39.7 % (ref 39.0–52.0)
Hemoglobin: 12.7 g/dL — ABNORMAL LOW (ref 13.0–17.0)
Immature Granulocytes: 0 %
Lymphocytes Relative: 45 %
Lymphs Abs: 2.4 10*3/uL (ref 0.7–4.0)
MCH: 30.8 pg (ref 26.0–34.0)
MCHC: 32 g/dL (ref 30.0–36.0)
MCV: 96.1 fL (ref 80.0–100.0)
Monocytes Absolute: 0.6 10*3/uL (ref 0.1–1.0)
Monocytes Relative: 12 %
Neutro Abs: 2.1 10*3/uL (ref 1.7–7.7)
Neutrophils Relative %: 39 %
Platelets: 154 10*3/uL (ref 150–400)
RBC: 4.13 MIL/uL — ABNORMAL LOW (ref 4.22–5.81)
RDW: 15.5 % (ref 11.5–15.5)
WBC: 5.2 10*3/uL (ref 4.0–10.5)
nRBC: 0 % (ref 0.0–0.2)

## 2019-04-11 LAB — HEMOGLOBIN A1C
Hgb A1c MFr Bld: 6.7 % — ABNORMAL HIGH (ref 4.8–5.6)
Mean Plasma Glucose: 145.59 mg/dL

## 2019-04-11 LAB — SEDIMENTATION RATE: Sed Rate: 31 mm/hr — ABNORMAL HIGH (ref 0–16)

## 2019-04-11 LAB — GLUCOSE, CAPILLARY
Glucose-Capillary: 78 mg/dL (ref 70–99)
Glucose-Capillary: 107 mg/dL — ABNORMAL HIGH (ref 70–99)

## 2019-04-11 LAB — C-REACTIVE PROTEIN: CRP: <0.8 mg/dL (ref <1.0)

## 2019-04-11 LAB — SARS CORONAVIRUS 2 BY RT PCR (HOSPITAL ORDER, PERFORMED IN ~~LOC~~ HOSPITAL LAB): SARS Coronavirus 2: NEGATIVE

## 2019-04-11 MED ORDER — FLUTICASONE PROPIONATE 50 MCG/ACT NA SUSP
2.0000 | Freq: Every day | NASAL | Status: DC
  Administered 2019-04-11 – 2019-04-12 (×2): 2 via NASAL
  Filled 2019-04-11: qty 16

## 2019-04-11 MED ORDER — ACETAMINOPHEN 650 MG RE SUPP: 650.0000 mg | Freq: Four times a day (QID) | RECTAL | Status: DC | PRN

## 2019-04-11 MED ORDER — ASPIRIN-DIPYRIDAMOLE ER 25-200 MG PO CP12
1.0000 | ORAL_CAPSULE | Freq: Two times a day (BID) | ORAL | Status: DC
  Administered 2019-04-11 – 2019-04-12 (×2): 1 via ORAL
  Filled 2019-04-11 (×8): qty 1

## 2019-04-11 MED ORDER — ATORVASTATIN CALCIUM 40 MG PO TABS
80.0000 mg | ORAL_TABLET | Freq: Every day | ORAL | Status: DC
  Administered 2019-04-11 – 2019-04-12 (×2): 80 mg via ORAL
  Filled 2019-04-11 (×2): qty 2

## 2019-04-11 MED ORDER — INSULIN ASPART 100 UNIT/ML ~~LOC~~ SOLN: 0.0000 [IU] | Freq: Every day | SUBCUTANEOUS | Status: DC

## 2019-04-11 MED ORDER — TIOTROPIUM BROMIDE MONOHYDRATE 18 MCG IN CAPS: 18.0000 ug | ORAL_CAPSULE | Freq: Every day | RESPIRATORY_TRACT | Status: DC

## 2019-04-11 MED ORDER — GABAPENTIN 400 MG PO CAPS
400.0000 mg | ORAL_CAPSULE | Freq: Three times a day (TID) | ORAL | Status: DC
  Administered 2019-04-11 – 2019-04-12 (×2): 400 mg via ORAL
  Filled 2019-04-11 (×2): qty 1

## 2019-04-11 MED ORDER — ACETAMINOPHEN 325 MG PO TABS: 650.0000 mg | ORAL_TABLET | Freq: Four times a day (QID) | ORAL | Status: DC | PRN

## 2019-04-11 MED ORDER — ENOXAPARIN SODIUM 40 MG/0.4ML ~~LOC~~ SOLN
40.0000 mg | SUBCUTANEOUS | Status: DC
  Administered 2019-04-11: 19:00:00 40 mg via SUBCUTANEOUS
  Filled 2019-04-11: qty 0.4

## 2019-04-11 MED ORDER — ONDANSETRON HCL 4 MG PO TABS: 4.0000 mg | ORAL_TABLET | Freq: Four times a day (QID) | ORAL | Status: DC | PRN

## 2019-04-11 MED ORDER — CLINDAMYCIN PHOSPHATE 600 MG/50ML IV SOLN
600.0000 mg | Freq: Once | INTRAVENOUS | Status: AC
  Administered 2019-04-11: 11:00:00 600 mg via INTRAVENOUS
  Filled 2019-04-11: qty 50

## 2019-04-11 MED ORDER — INSULIN GLARGINE 100 UNIT/ML ~~LOC~~ SOLN
10.0000 [IU] | Freq: Every day | SUBCUTANEOUS | Status: DC
  Administered 2019-04-11: 22:00:00 10 [IU] via SUBCUTANEOUS
  Filled 2019-04-11 (×4): qty 0.1

## 2019-04-11 MED ORDER — ONDANSETRON HCL 4 MG/2ML IJ SOLN: 4.0000 mg | Freq: Four times a day (QID) | INTRAMUSCULAR | Status: DC | PRN

## 2019-04-11 MED ORDER — UMECLIDINIUM BROMIDE 62.5 MCG/INH IN AEPB
1.0000 | INHALATION_SPRAY | Freq: Every day | RESPIRATORY_TRACT | Status: DC
  Administered 2019-04-12: 1 via RESPIRATORY_TRACT
  Filled 2019-04-11: qty 7

## 2019-04-11 MED ORDER — TRAMADOL HCL 50 MG PO TABS: 50.0000 mg | ORAL_TABLET | Freq: Four times a day (QID) | ORAL | Status: DC | PRN

## 2019-04-11 MED ORDER — METOPROLOL SUCCINATE ER 50 MG PO TB24
50.0000 mg | ORAL_TABLET | Freq: Every day | ORAL | Status: DC
  Administered 2019-04-11 – 2019-04-12 (×2): 50 mg via ORAL
  Filled 2019-04-11 (×2): qty 1

## 2019-04-11 MED ORDER — SODIUM CHLORIDE 0.9 % IV SOLN
INTRAVENOUS | Status: DC
  Administered 2019-04-11: 19:00:00 via INTRAVENOUS

## 2019-04-11 MED ORDER — INSULIN ASPART 100 UNIT/ML ~~LOC~~ SOLN: 0.0000 [IU] | Freq: Three times a day (TID) | SUBCUTANEOUS | Status: DC

## 2019-04-11 NOTE — Consult Note (Addendum)
WOC Nurse wound consult note Patient receiving care in [facility] ED14.  Consult completed remotely after review of images and notes. Reason for Consult: Left heel eschar Wound type: NOT consistent with recent injury as the eschar is dry and stable.  Unclear etiology at this time  Measurement: To be entered by bedside RN Wound bed: 100% black, stable eschar Drainage (amount, consistency, odor) none Periwound: intact  Dressing procedure/placement/frequency: Apply iodine to the left heel eschar. Allow to air dry.  Also, use of Prevalon heel lift boot to foot.  MD, please consult podiatry.  Dry, stable eschar is preferred to wet/moist on a full thickness wound to a diabetic foot. Monitor the wound area(s) for worsening of condition such as: Signs/symptoms of infection,  Increase in size,  Development of or worsening of odor, Development of pain, or increased pain at the affected locations.  Notify the medical team if any of these develop.  Thank you for the consult. WOC nurse will not follow at this time.  Please re-consult the WOC team if needed.  Helmut Muster, RN, MSN, CWOCN, CNS-BC, pager [phone]

## 2019-04-11 NOTE — ED Provider Notes (Signed)
[facility] EMERGENCY DEPARTMENT Provider Note   CSN: [id number] Arrival date & time: 04/11/19  0820    History   Chief Complaint Chief Complaint  Patient presents with  . Foot Pain    HPI [name] is a 63 y.o. male.     HPI   [name] is a 63 y.o. male with a hx of DM, who presents to the Emergency Department complaining of pain and a black discoloration to the heel of his left foot.  Noticed some pain associated with weight bearing this morning.  He recalls a direct blow from a car door to his left lower leg about one month ago, but denies having any open wounds.  He is able to walk with weight bearing on the ball of his foot.  He denies pain radiating into his leg, fever, chills, and swelling of the extremity.  States his blood sugar was 105 this morning.    Past Medical History:  Diagnosis Date  . Coronary artery disease   . Diabetes mellitus   . Diastolic heart failure   . Hyperlipidemia   . Hypertension   . Obesity   . OSA on CPAP     Patient Active Problem List   Diagnosis Date Noted  . Nasal turbinate hypertrophy 08/04/2017  . Chronic cough 06/22/2017  . Laryngopharyngeal reflux (LPR) 06/22/2017  . TIA (transient ischemic attack) 07/13/2011  . Cerebrovascular disease 07/13/2011  . Tobacco abuse 07/12/2011  . Hyperkalemia 07/12/2011  . ATHEROSCLEROTIC CARDIOVASCULAR DISEASE 08/21/2009  . DIABETES MELLITUS 05/12/2009  . HYPERLIPIDEMIA-MIXED 05/12/2009  . OVERWEIGHT/OBESITY 05/12/2009  . OBSTRUCTIVE SLEEP APNEA 05/12/2009  . HYPERTENSION, UNSPECIFIED 05/12/2009  . DIASTOLIC HEART FAILURE, CHRONIC 05/12/2009    Past Surgical History:  Procedure Laterality Date  . CARDIAC CATHETERIZATION    . NO PAST SURGERIES          Home Medications    Prior to Admission medications   Medication Sig Start Date End Date Taking? Authorizing Provider  aspirin EC 81 MG tablet Take 81 mg by mouth daily.    [provider]  atorvastatin  (LIPITOR) 80 MG tablet Take 1 tablet (80 mg total) by mouth daily. 02/06/19   Antoine Poche, MD  budesonide-formoterol (SYMBICORT) 160-4.5 MCG/ACT inhaler Inhale 2 puffs into the lungs 2 (two) times daily. 04/03/18   Mannam, Colbert Coyer, MD  budesonide-formoterol (SYMBICORT) 160-4.5 MCG/ACT inhaler Inhale 2 puffs into the lungs 2 (two) times daily. 04/03/18   Mannam, Colbert Coyer, MD  canagliflozin (INVOKANA) 300 MG TABS tablet Take 300 mg by mouth daily before breakfast.    [provider]  cetirizine (ZYRTEC) 10 MG tablet Take 10 mg by mouth daily.    [provider]  dipyridamole-aspirin (AGGRENOX) 200-25 MG per 12 hr capsule Take 1 capsule by mouth 2 (two) times daily.    [provider]  fluticasone (FLONASE) 50 MCG/ACT nasal spray 2 sprays by Each Nare route daily. 08/04/17   [provider]  gabapentin (NEURONTIN) 400 MG capsule Take 400 mg by mouth 3 (three) times daily.    [provider]  Insulin Glargine (LANTUS SOLOSTAR) 100 UNIT/ML Solostar Pen Inject 10 Units into the skin daily at 10 pm.    [provider]  lisinopril-hydrochlorothiazide (PRINZIDE,ZESTORETIC) 20-12.5 MG tablet TAKE 1 TABLET BY MOUTH ONCE DAILY. 04/26/17   Antoine Poche, MD  lisinopril-hydrochlorothiazide (PRINZIDE,ZESTORETIC) 20-12.5 MG tablet TAKE 1 TABLET BY MOUTH ONCE DAILY. 09/06/18   Antoine Poche, MD  lisinopril-hydrochlorothiazide (PRINZIDE,ZESTORETIC) 20-12.5  MG tablet TAKE 1 TABLET BY MOUTH ONCE DAILY. 10/09/18   Antoine PocheBranch, Jonathan F, MD  lisinopril-hydrochlorothiazide (PRINZIDE,ZESTORETIC) 20-12.5 MG tablet TAKE 1 TABLET BY MOUTH ONCE DAILY. 11/05/18   Antoine PocheBranch, Jonathan F, MD  metoprolol succinate (TOPROL-XL) 50 MG 24 hr tablet TAKE 1 AND 1/2 TABLETS BY MOUTH ONCE DAILY. 11/02/18   Antoine PocheBranch, Jonathan F, MD  Respiratory Therapy Supplies (FLUTTER) DEVI Use as directed 10/13/17   Chilton GreathouseMannam, Praveen, MD  tiotropium (SPIRIVA) 18 MCG inhalation capsule Place 1 capsule (18  mcg total) into inhaler and inhale daily. 01/05/18   Chilton GreathouseMannam, Praveen, MD    Family History Family History  Problem Relation Age of Onset  . Diabetes Father   . Hypertension Father     Social History Social History   Tobacco Use  . Smoking status: Current Every Day Smoker    Packs/day: 1.00    Years: 15.00    Pack years: 15.00    Types: Cigarettes  . Smokeless tobacco: Never Used  . Tobacco comment: Down to a cigarette per week  Substance Use Topics  . Alcohol use: Yes    Alcohol/week: 4.0 standard drinks    Types: 2 Cans of beer, 2 Shots of liquor per week    Comment: occ  . Drug use: No     Allergies   Patient has no known allergies.   Review of Systems Review of Systems  Constitutional: Negative for chills and fever.  Respiratory: Negative for cough and shortness of breath.   Cardiovascular: Negative for chest pain.  Gastrointestinal: Negative for nausea and vomiting.  Genitourinary: Negative for difficulty urinating and dysuria.  Musculoskeletal: Positive for arthralgias (pain left foot). Negative for back pain.  Skin: Positive for color change (black discoloration left heel). Negative for wound.  Neurological: Negative for dizziness, weakness and numbness.  Psychiatric/Behavioral: Negative for confusion.     Physical Exam Updated Vital Signs BP (!) 139/94 (BP Location: Right Arm)   Pulse 67   Temp 98.3 F (36.8 C) (Oral)   Resp 16   Ht 6\' 1"  (1.854 m)   Wt 129.3 kg   SpO2 97%   BMI 37.60 kg/m   Physical Exam Vitals signs and nursing note reviewed.  Constitutional:      General: He is not in acute distress.    Appearance: He is well-developed.  HENT:     Head: Atraumatic.  Cardiovascular:     Rate and Rhythm: Normal rate and regular rhythm.     Pulses: Normal pulses.     Comments: DP and PT pulses of the left foot heard with a portable doppler Pulmonary:     Effort: Pulmonary effort is normal.     Breath sounds: Normal breath sounds.   Musculoskeletal:        General: Tenderness present.     Comments: Focal ttp of the lateral aspect of the left heel.  6 cm area of necrosis of the skin.  No drainage, edema or surrounding erythema.   Skin:    General: Skin is warm.     Capillary Refill: Capillary refill takes 2 to 3 seconds.  Neurological:     General: No focal deficit present.     Mental Status: He is alert.     Sensory: No sensory deficit.     Motor: No weakness or abnormal muscle tone.          ED Treatments / Results  Labs (all labs ordered are listed, but only abnormal results are displayed) Labs Reviewed  BASIC METABOLIC PANEL - Abnormal; Notable for the following components:      Result Value   Glucose, Bld 121 (*)    BUN 26 (*)    Creatinine, Ser 1.56 (*)    GFR calc non Af Amer 48 (*)    GFR calc Af Amer 55 (*)    All other components within normal limits  CBC WITH DIFFERENTIAL/PLATELET - Abnormal; Notable for the following components:   RBC 4.13 (*)    Hemoglobin 12.7 (*)    All other components within normal limits  CULTURE, BLOOD (ROUTINE X 2)  CULTURE, BLOOD (ROUTINE X 2)  SARS CORONAVIRUS 2 (HOSPITAL ORDER, PERFORMED IN [facility])    EKG None  Radiology Dg Foot Complete Left  Result Date: 04/11/2019 CLINICAL DATA:  Foot pain, injury 1 month ago. EXAM: LEFT FOOT - COMPLETE 3+ VIEW COMPARISON:  None. FINDINGS: No acute bony abnormality. Specifically, no fracture, subluxation, or dislocation. Mild degenerative changes in the tarsal region. Soft tissues are intact. IMPRESSION: No acute bony abnormality. Electronically Signed   By: Charlett Nose M.D.   On: 04/11/2019 10:22    Procedures Procedures (including critical care time)  Medications Ordered in ED Medications  clindamycin (CLEOCIN) IVPB 600 mg (600 mg Intravenous New Bag/Given 04/11/19 1111)     Initial Impression / Assessment and Plan / ED Course  I have reviewed the triage vital signs and the nursing notes.   Pertinent labs & imaging results that were available during my care of the patient were reviewed by me and considered in my medical decision making (see chart for details).         Pt also seen by Dr. Estell Harpin and care plan discussed.     Pt with necrotic area to lateral left heel.  He is well appearing, non-toxic.  NV intact.  Pt will likely benefit from admission for IV abx and possible surgical involvement .  Consulted hospitalist, Dr. Arbutus Leas, who agrees to evaluate pt in pt and arrange admission   Final Clinical Impressions(s) / ED Diagnoses   Final diagnoses:  Diabetic infection of left foot [facility])    ED Discharge Orders    None       Rosey Bath 04/11/19 1252    Bethann Berkshire, MD 04/13/19 1629

## 2019-04-11 NOTE — H&P (Signed)
History and Physical  [name] [facility]:[id number] DOB: 1956/03/24 DOA: 04/11/2019   PCP: Nolene Ebbs, MD   Patient coming from: Home  Chief Complaint: left heel pain  HPI:  [name] is a 63 y.o. male with medical history of hypertension, diastolic CHF, diabetes mellitus, coronary artery disease, hyperlipidemia presenting with left heel pain x2 to 3 days.  The patient states that he hit his left foot against a car door approximately 1 month prior to this admission.  He denied any broken skin, bleeding, or drainage from the left foot.  He states that he had some soreness in his left heel that has gradually worsened over this past month, but was significantly worse over the last 3 days affecting his ambulation.  As result, the patient presented for further evaluation.  He denied any increasing erythema, drainage, or new injuries to his left foot since the initial event.  He denied any fevers, chills, chest pain, shortness breath, coughing, hemoptysis, nausea, vomiting, diarrhea, abdominal pain, dysuria, hematuria.  The patient states that he has not been back to see his primary care physician for nearly 56-monthsecondary to the CModenapandemic.  He states that he does not check his feet every day, but he has been soaking his left foot in salt water at least once or twice a week since the initial injury.  Because of increasing pain with weightbearing, he presented for further evaluation. In the emergency department, the patient was afebrile hemodynamically stable saturating 98% on room air.  BMP showed a serum creatinine of 1.56 which is above his usual baseline.  CBC was unremarkable.  Because the patient has unreliable follow-up and appears to have poor health literacy, observation admission was requested.  Assessment/Plan: Left heel eschar/foot pain -Does not appear to be clinically infected -ESR -CRP -Personally reviewed left foot x-ray--no bony abnormality -Hold off on any  antibiotics as the patient is afebrile and hemodynamically stable without any leukocytosis -Wound care consult -General surgery consult -tramadol prn pain  Acute on chronic renal failure--CKD stage II -Baseline creatinine 1.2-1.3 -Presented with serum creatinine 12.44-Patient certainly may have progression of his underlying CKD -IV fluids x24 hours -Repeat BMP in a.m.  Diabetes mellitus type 2 -Hemoglobin A1c -NovoLog sliding scale -Start reduced dose Lantus -Holding Trulicity and Invokana  Essential hypertension -Holding lisinopril/HCTZ in the setting of elevated serum creatinine -Continue metoprolol succinate  Tobacco abuse -Tobacco cessation discussed  Hyperlipidemia -Continue statin  Coronary artery disease -No chest pain presently -Status post CABG June 2009 -Continue metoprolol and Lipitor -Restart aggrenox  Diabetic polyneuropathy -Continue gabapentin  Chronic diastolic CHF -69/75/30Echo 55% -clinically euvolemic      Past Medical History:  Diagnosis Date  . Coronary artery disease   . Diabetes mellitus   . Diastolic heart failure   . Hyperlipidemia   . Hypertension   . Obesity   . OSA on CPAP    Past Surgical History:  Procedure Laterality Date  . CARDIAC CATHETERIZATION    . NO PAST SURGERIES     Social History:  reports that he has been smoking cigarettes. He has a 15.00 pack-year smoking history. He has never used smokeless tobacco. He reports current alcohol use of about 4.0 standard drinks of alcohol per week. He reports that he does not use drugs.   Family History  Problem Relation Age of Onset  . Diabetes Father   . Hypertension Father      No Known Allergies  Prior to Admission medications   Medication Sig Start Date End Date Taking? Authorizing Provider  atorvastatin (LIPITOR) 80 MG tablet Take 1 tablet (80 mg total) by mouth daily. 02/06/19  Yes Branch, Alphonse Guild, MD  canagliflozin (INVOKANA) 300 MG TABS tablet Take 300 mg  by mouth daily before breakfast.   Yes [provider]  dipyridamole-aspirin (AGGRENOX) 200-25 MG per 12 hr capsule Take 1 capsule by mouth 2 (two) times daily.   Yes [provider]  Dulaglutide (TRULICITY) 1.5 JA/2.5KN SOPN Inject 1.5 mg into the skin once a week. On fridays   Yes [provider]  fluticasone (FLONASE) 50 MCG/ACT nasal spray 2 sprays by Each Nare route daily. 08/04/17  Yes [provider]  gabapentin (NEURONTIN) 400 MG capsule Take 400 mg by mouth 3 (three) times daily.   Yes [provider]  Insulin Glargine (LANTUS SOLOSTAR) 100 UNIT/ML Solostar Pen Inject 10 Units into the skin 2 (two) times daily.    Yes [provider]  lisinopril-hydrochlorothiazide (PRINZIDE,ZESTORETIC) 20-12.5 MG tablet TAKE 1 TABLET BY MOUTH ONCE DAILY. 04/26/17  Yes Branch, Alphonse Guild, MD  metoprolol succinate (TOPROL-XL) 50 MG 24 hr tablet TAKE 1 AND 1/2 TABLETS BY MOUTH ONCE DAILY. 11/02/18  Yes Arnoldo Lenis, MD  tiotropium (SPIRIVA) 18 MCG inhalation capsule Place 1 capsule (18 mcg total) into inhaler and inhale daily. 01/05/18  Yes Mannam, Praveen, MD    Review of Systems:  Constitutional:  No weight loss, night sweats, Fevers, chills, fatigue.  Head&Eyes: No headache.  No vision loss.  No eye pain or scotoma ENT:  No Difficulty swallowing,Tooth/dental problems,Sore throat,  No ear ache, post nasal drip,  Cardio-vascular:  No chest pain, Orthopnea, PND, swelling in lower extremities,  dizziness, palpitations  GI:  No  abdominal pain, nausea, vomiting, diarrhea, loss of appetite, hematochezia, melena, heartburn, indigestion, Resp:  No shortness of breath with exertion or at rest. No cough. No coughing up of blood .No wheezing.No chest wall deformity  Skin:  no rash or lesions.  GU:  no dysuria, change in color of urine, no urgency or frequency. No flank pain.  Musculoskeletal:  No decreased range of motion. No back pain.  Psych:   No change in mood or affect. No depression or anxiety. Neurologic: No headache, no dysesthesia, no focal weakness, no vision loss. No syncope  Physical Exam: Vitals:   04/11/19 0910 04/11/19 0911  BP: (!) 139/94   Pulse: 67   Resp: 16   Temp: 98.3 F (36.8 C)   TempSrc: Oral   SpO2: 97%   Weight:  129.3 kg  Height:  '6\' 1"'$  (1.854 m)   General:  A&O x 3, NAD, nontoxic, pleasant/cooperative Head/Eye: No conjunctival hemorrhage, no icterus, [place]AT, No nystagmus ENT:  No icterus,  No thrush, good dentition, no pharyngeal exudate Neck:  No masses, no lymphadenpathy, no bruits CV:  RRR, no rub, no gallop, no S3 Lung:  CTAB, good air movement, no wheeze, no rhonchi Abdomen: soft/NT, +BS, nondistended, no peritoneal signs Ext: eschar on left heel.  See pictures.  No erythema or crepitance Neuro: CNII-XII intact, strength 4/5 in bilateral upper and lower extremities, no dysmetria        Labs on Admission:  Basic Metabolic Panel: Recent Labs  Lab 04/11/19 0949  NA 138  K 4.8  CL 105  CO2 23  GLUCOSE 121*  BUN 26*  CREATININE 1.56*  CALCIUM 9.1   Liver Function Tests: No results for input(s): AST, ALT,  ALKPHOS, BILITOT, PROT, ALBUMIN in the last 168 hours. No results for input(s): LIPASE, AMYLASE in the last 168 hours. No results for input(s): AMMONIA in the last 168 hours. CBC: Recent Labs  Lab 04/11/19 0949  WBC 5.2  NEUTROABS 2.1  HGB 12.7*  HCT 39.7  MCV 96.1  PLT 154   Coagulation Profile: No results for input(s): INR, PROTIME in the last 168 hours. Cardiac Enzymes: No results for input(s): CKTOTAL, CKMB, CKMBINDEX, TROPONINI in the last 168 hours. BNP: Invalid input(s): POCBNP CBG: No results for input(s): GLUCAP in the last 168 hours. Urine analysis:    Component Value Date/Time   COLORURINE YELLOW 04/16/2008 2228   APPEARANCEUR CLEAR 04/16/2008 2228   LABSPEC 1.017 04/16/2008 2228   PHURINE 5.5 04/16/2008 2228   GLUCOSEU NEGATIVE 04/16/2008  2228   HGBUR NEGATIVE 04/16/2008 2228   BILIRUBINUR NEGATIVE 04/16/2008 2228   KETONESUR NEGATIVE 04/16/2008 2228   PROTEINUR NEGATIVE 04/16/2008 2228   UROBILINOGEN 0.2 04/16/2008 2228   NITRITE NEGATIVE 04/16/2008 2228   LEUKOCYTESUR  04/16/2008 2228    NEGATIVE MICROSCOPIC NOT DONE ON URINES WITH NEGATIVE PROTEIN, BLOOD, LEUKOCYTES, NITRITE, OR GLUCOSE <1000 mg/dL.   Sepsis Labs: _0 (procalcitonin:4,lacticidven:4) ) Recent Results (from the past 240 hour(s))  SARS Coronavirus 2 (CEPHEID - Performed in [place] hospital lab), Hosp Order     Status: None   Collection Time: 04/11/19 11:09 AM  Result Value Ref Range Status   SARS Coronavirus 2 NEGATIVE NEGATIVE Final    Comment: (NOTE) If result is NEGATIVE SARS-CoV-2 target nucleic acids are NOT DETECTED. The SARS-CoV-2 RNA is generally detectable in upper and lower  respiratory specimens during the acute phase of infection. The lowest  concentration of SARS-CoV-2 viral copies this assay can detect is 250  copies / mL. A negative result does not preclude SARS-CoV-2 infection  and should not be used as the sole basis for treatment or other  patient management decisions.  A negative result may occur with  improper specimen collection / handling, submission of specimen other  than nasopharyngeal swab, presence of viral mutation(s) within the  areas targeted by this assay, and inadequate number of viral copies  (<250 copies / mL). A negative result must be combined with clinical  observations, patient history, and epidemiological information. If result is POSITIVE SARS-CoV-2 target nucleic acids are DETECTED. The SARS-CoV-2 RNA is generally detectable in upper and lower  respiratory specimens dur ing the acute phase of infection.  Positive  results are indicative of active infection with SARS-CoV-2.  Clinical  correlation with patient history and other diagnostic information is  necessary to determine patient infection  status.  Positive results do  not rule out bacterial infection or co-infection with other viruses. If result is PRESUMPTIVE POSTIVE SARS-CoV-2 nucleic acids MAY BE PRESENT.   A presumptive positive result was obtained on the submitted specimen  and confirmed on repeat testing.  While 2019 novel coronavirus  (SARS-CoV-2) nucleic acids may be present in the submitted sample  additional confirmatory testing may be necessary for epidemiological  and / or clinical management purposes  to differentiate between  SARS-CoV-2 and other Sarbecovirus currently known to infect humans.  If clinically indicated additional testing with an alternate test  methodology [phone]) is advised. The SARS-CoV-2 RNA is generally  detectable in upper and lower respiratory sp ecimens during the acute  phase of infection. The expected result is Negative. Fact Sheet for Patients:  StrictlyIdeas.no Fact Sheet for Healthcare Providers: BankingDealers.co.za This test is not  yet approved or cleared by the [place] and has been authorized for detection and/or diagnosis of SARS-CoV-2 by FDA under an Emergency Use Authorization (EUA).  This EUA will remain in effect (meaning this test can be used) for the duration of the COVID-19 declaration under Section 564(b)(1) of the Act, 21 U.S.C. section 360bbb-3(b)(1), unless the authorization is terminated or revoked sooner. Performed at [facility], [address], [place], [place] [address]   Culture, blood (Routine X 2) w Reflex to ID Panel     Status: None (Preliminary result)   Collection Time: 04/11/19 11:09 AM  Result Value Ref Range Status   Specimen Description   Final    RIGHT ANTECUBITAL BOTTLES DRAWN AEROBIC AND ANAEROBIC   Special Requests   Final    Blood Culture results may not be optimal due to an inadequate volume of blood received in culture bottles Performed at [facility], [address]  [place], [place] [address]    Culture PENDING  Incomplete   Report Status PENDING  Incomplete  Culture, blood (Routine X 2) w Reflex to ID Panel     Status: None (Preliminary result)   Collection Time: 04/11/19 11:18 AM  Result Value Ref Range Status   Specimen Description LEFT ANTECUBITAL Blood Culture adequate volume  Final   Special Requests   Final    BOTTLES DRAWN AEROBIC AND ANAEROBIC Performed at [facility], [address] [place], [place] [address]    Culture PENDING  Incomplete   Report Status PENDING  Incomplete     Radiological Exams on Admission: Dg Foot Complete Left  Result Date: 04/11/2019 CLINICAL DATA:  Foot pain, injury 1 month ago. EXAM: LEFT FOOT - COMPLETE 3+ VIEW COMPARISON:  None. FINDINGS: No acute bony abnormality. Specifically, no fracture, subluxation, or dislocation. Mild degenerative changes in the tarsal region. Soft tissues are intact. IMPRESSION: No acute bony abnormality. Electronically Signed   By: Rolm Baptise M.D.   On: 04/11/2019 10:22      Time spent:60 minutes Code Status:   FULL Family Communication:  No Family at bedside Disposition Plan: expect 1 day hospitalization Consults called: general surgery DVT Prophylaxis: [place] Lovenox  Orson Eva, DO  [facility] Pager [phone]  If 7PM-7AM, please contact night-coverage [email or web address] Password [email or web address] 04/11/2019, 1:47 PM

## 2019-04-11 NOTE — ED Triage Notes (Signed)
Patient states he noticed that his left heel has turned black this morning. States pain that is sharp and is having to walk on his toes. Patient is diabetic with CBG of 105 this morning at home.

## 2019-04-12 ENCOUNTER — Telehealth: Payer: Self-pay | Admitting: Cardiology

## 2019-04-12 ENCOUNTER — Observation Stay (HOSPITAL_COMMUNITY): Payer: Medicaid Other

## 2019-04-12 DIAGNOSIS — L089 Local infection of the skin and subcutaneous tissue, unspecified: Secondary | ICD-10-CM | POA: Diagnosis not present

## 2019-04-12 DIAGNOSIS — E782 Mixed hyperlipidemia: Secondary | ICD-10-CM | POA: Diagnosis not present

## 2019-04-12 DIAGNOSIS — E11628 Type 2 diabetes mellitus with other skin complications: Secondary | ICD-10-CM | POA: Diagnosis not present

## 2019-04-12 DIAGNOSIS — I739 Peripheral vascular disease, unspecified: Secondary | ICD-10-CM | POA: Diagnosis not present

## 2019-04-12 DIAGNOSIS — Z720 Tobacco use: Secondary | ICD-10-CM | POA: Diagnosis not present

## 2019-04-12 DIAGNOSIS — N179 Acute kidney failure, unspecified: Secondary | ICD-10-CM | POA: Diagnosis not present

## 2019-04-12 DIAGNOSIS — R234 Changes in skin texture: Secondary | ICD-10-CM | POA: Diagnosis not present

## 2019-04-12 LAB — BASIC METABOLIC PANEL
Anion gap: 10 (ref 5–15)
BUN: 22 mg/dL — ABNORMAL HIGH (ref 6–20)
CO2: 26 mmol/L (ref 22–32)
Calcium: 9.2 mg/dL (ref 8.9–10.3)
Chloride: 105 mmol/L (ref 98–111)
Creatinine, Ser: 1.21 mg/dL (ref 0.61–1.24)
GFR calc Af Amer: >60 mL/min (ref >60)
GFR calc non Af Amer: >60 mL/min (ref >60)
Glucose, Bld: 128 mg/dL — ABNORMAL HIGH (ref 70–99)
Potassium: 4.4 mmol/L (ref 3.5–5.1)
Sodium: 141 mmol/L (ref 135–145)

## 2019-04-12 LAB — GLUCOSE, CAPILLARY: Glucose-Capillary: 109 mg/dL — ABNORMAL HIGH (ref 70–99)

## 2019-04-12 LAB — HIV ANTIBODY (ROUTINE TESTING W REFLEX): HIV Screen 4th Generation wRfx: NONREACTIVE

## 2019-04-12 MED ORDER — TRAMADOL HCL 50 MG PO TABS: 50.0000 mg | ORAL_TABLET | Freq: Four times a day (QID) | ORAL | 0 refills | Status: DC | PRN

## 2019-04-12 MED ORDER — AMLODIPINE BESYLATE 5 MG PO TABS: 5.0000 mg | ORAL_TABLET | Freq: Every day | ORAL | Status: DC

## 2019-04-12 MED ORDER — AMLODIPINE BESYLATE 5 MG PO TABS: 5.0000 mg | ORAL_TABLET | Freq: Every day | ORAL | 1 refills | Status: DC

## 2019-04-12 NOTE — Progress Notes (Signed)
prevalon boot retrieved from materials. Applied per orders. Explained to pt purpose

## 2019-04-12 NOTE — Telephone Encounter (Signed)
Please give pt a call concerning his BP medications

## 2019-04-12 NOTE — Progress Notes (Signed)
[facility]  Patient seen this AM. Full Note to Follow.  Diabetic with dry eschar on the left heel after injury hitting it on a car approximately 3-4 weeks ago.  He denies any prior diabetic foot issues. He is a poor historian and does not seek out medical attention often.   BP 139/88 (BP Location: Right Arm)   Pulse 60   Temp 97.9 F (36.6 C) (Oral)   Resp (!) 24   Ht 6\' 1"  (1.854 m)   Wt 129.3 kg   SpO2 99%   BMI 37.60 kg/m  Palpable femoral and popliteal pulses bilateral No palpable DP or PT bilateral  Left heel dry eschar, no drainage   Patient with dry eschar to his heel after an injury weeks ago. He is diabetic and a risk for infections. At this time there is no evidence of infection. Recommend ABI while inpatient and obtaining Vascular Follow up ASAP to assess him for PAD.  Discussed the foot care with the patient, risk of continued wounds and infections, risk of infections, and need for going to see Vascular.  He expressed understanding. Also discussed no soaking of the left foot due to the eschar.  -Betadine painted to left heel daily  -ABI while inpatinet -Vascular outpatient followup, would get appointment set up. He uses [facility] for transportation and needs to be 1 week out.  -No acute need for surgical debridement, no signs of infection.  Discussed with Dr. Arbutus Leas.   Algis Greenhouse, MD [facility] [address] [place], [place] [address] [phone] (office)

## 2019-04-12 NOTE — Consult Note (Signed)
[facility] Consult  Reason for Consult: Left heel eschar  Referring Physician:  Dr. Arbutus Leas   Chief Complaint    Foot Pain      [name] is a 63 y.o. male.  HPI: Mr .Villegas is a 62 yo who reported that about 3-4 weeks ago he hit his left heel on his car. He does not recall having an ulcer or wound or having any drainage from that area. He does report having pain in the heel and that is what prompted him to go to the ED and be evaluated. He does not seek medical care often, and says that he soaked his feet last week and did not notice the black area on the heel.  He looked at the heel when he was having the pain, and that prompted him to go to the ED.  He otherwise denied fevers, chills, drainage, other foot wounds or signs of infection.  He had an Xray in the ED that demonstrated no bony abnormality and was brought into the hospital for observation in order to get him further evaluated due to concerns for poor compliance.   He denies any cramping or leg pain with walking, but says his left foot and leg always feel a little "tight.  Past Medical History:  Diagnosis Date  . Coronary artery disease   . Diabetes mellitus   . Diastolic heart failure   . Hyperlipidemia   . Hypertension   . Obesity   . OSA on CPAP     Past Surgical History:  Procedure Laterality Date  . CARDIAC CATHETERIZATION    . NO PAST SURGERIES      Family History  Problem Relation Age of Onset  . Diabetes Father   . Hypertension Father     Social History   Tobacco Use  . Smoking status: Current Every Day Smoker    Packs/day: 1.00    Years: 15.00    Pack years: 15.00    Types: Cigarettes  . Smokeless tobacco: Never Used  . Tobacco comment: Down to a cigarette per week  Substance Use Topics  . Alcohol use: Yes    Alcohol/week: 4.0 standard drinks    Types: 2 Cans of beer, 2 Shots of liquor per week    Comment: occ  . Drug use: No    Medications:  I have reviewed the  patient's current medications.  Current Facility-Administered Medications  Medication Dose Route Frequency Provider Last Rate Last Dose  . 0.9 %  sodium chloride infusion   Intravenous Continuous Tat, David, MD 75 mL/hr at 04/12/19 0500    . acetaminophen (TYLENOL) tablet 650 mg  650 mg Oral Q6H PRN Tat, David, MD       Or  . acetaminophen (TYLENOL) suppository 650 mg  650 mg Rectal Q6H PRN Tat, Onalee Hua, MD      . amLODipine (NORVASC) tablet 5 mg  5 mg Oral Daily Tat, David, MD      . atorvastatin (LIPITOR) tablet 80 mg  80 mg Oral Daily Tat, Onalee Hua, MD   80 mg at 04/12/19 0903  . dipyridamole-aspirin (AGGRENOX) 200-25 MG per 12 hr capsule 1 capsule  1 capsule Oral BID Tat, Onalee Hua, MD   1 capsule at 04/12/19 0903  . enoxaparin (LOVENOX) injection 40 mg  40 mg Subcutaneous Q24H Tat, Onalee Hua, MD   40 mg at 04/11/19 1839  . fluticasone (FLONASE) 50 MCG/ACT nasal spray 2 spray  2 spray Each Nare Daily Tat, Onalee Hua, MD  2 spray at 04/12/19 0903  . gabapentin (NEURONTIN) capsule 400 mg  400 mg Oral TID Catarina Hartshorn, MD   400 mg at 04/12/19 0903  . insulin aspart (novoLOG) injection 0-15 Units  0-15 Units Subcutaneous TID WC Tat, David, MD      . insulin aspart (novoLOG) injection 0-5 Units  0-5 Units Subcutaneous QHS Tat, David, MD      . insulin glargine (LANTUS) injection 10 Units  10 Units Subcutaneous Maura Crandall, MD   10 Units at 04/11/19 2209  . metoprolol succinate (TOPROL-XL) 24 hr tablet 50 mg  50 mg Oral Daily Tat, David, MD   50 mg at 04/12/19 0903  . ondansetron (ZOFRAN) tablet 4 mg  4 mg Oral Q6H PRN Tat, David, MD       Or  . ondansetron (ZOFRAN) injection 4 mg  4 mg Intravenous Q6H PRN Tat, Onalee Hua, MD      . traMADol Janean Sark) tablet 50 mg  50 mg Oral Q6H PRN Tat, David, MD      . umeclidinium bromide (INCRUSE ELLIPTA) 62.5 MCG/INH 1 puff  1 puff Inhalation Daily Tat, Onalee Hua, MD   1 puff at 04/12/19 0919   Current Outpatient Medications  Medication Sig Dispense Refill Last Dose  .  atorvastatin (LIPITOR) 80 MG tablet Take 1 tablet (80 mg total) by mouth daily. 90 tablet 3 04/11/2019 at Unknown time  . canagliflozin (INVOKANA) 300 MG TABS tablet Take 300 mg by mouth daily before breakfast.   04/11/2019 at Unknown time  . dipyridamole-aspirin (AGGRENOX) 200-25 MG per 12 hr capsule Take 1 capsule by mouth 2 (two) times daily.   04/11/2019 at Unknown time  . Dulaglutide (TRULICITY) 1.5 MG/0.5ML SOPN Inject 1.5 mg into the skin once a week. On fridays   Past Week at Unknown time  . fluticasone (FLONASE) 50 MCG/ACT nasal spray 2 sprays by Each Nare route daily.   unknown  . gabapentin (NEURONTIN) 400 MG capsule Take 400 mg by mouth 3 (three) times daily.   04/11/2019 at Unknown time  . Insulin Glargine (LANTUS SOLOSTAR) 100 UNIT/ML Solostar Pen Inject 10 Units into the skin 2 (two) times daily.    04/11/2019 at Unknown time  . metoprolol succinate (TOPROL-XL) 50 MG 24 hr tablet TAKE 1 AND 1/2 TABLETS BY MOUTH ONCE DAILY. 45 tablet 11 04/11/2019 at 0430  . tiotropium (SPIRIVA) 18 MCG inhalation capsule Place 1 capsule (18 mcg total) into inhaler and inhale daily. 30 capsule 6 Past Month at Unknown time  . amLODipine (NORVASC) 5 MG tablet Take 1 tablet (5 mg total) by mouth daily. 30 tablet 1   . traMADol (ULTRAM) 50 MG tablet Take 1 tablet (50 mg total) by mouth every 6 (six) hours as needed for moderate pain. 12 tablet 0   UJW:[id number] **OR** acetaminophen, ondansetron **OR** ondansetron (ZOFRAN) IV, traMADol  No Known Allergies  ROS:  A comprehensive review of systems was negative except for: Musculoskeletal: positive for left heel pain and swelling in his left foot  Blood pressure 139/88, pulse 60, temperature 97.9 F (36.6 C), temperature source Oral, resp. rate (!) 24, height  (1.854 m), weight 129.3 kg, SpO2 99 %. Physical Exam Vitals signs reviewed.  Constitutional:      Appearance: Normal appearance.  HENT:     Head: Normocephalic.     Nose: Nose normal.      Mouth/Throat:     Mouth: Mucous membranes are moist.  Eyes:     Pupils: Pupils are  equal, round, and reactive to light.  Neck:     Musculoskeletal: Normal range of motion.  Cardiovascular:     Rate and Rhythm: Normal rate.     Pulses:          Femoral pulses are 1+ on the right side and 1+ on the left side.      Popliteal pulses are 1+ on the right side and 1+ on the left side.       Dorsalis pedis pulses are 0 on the right side and 0 on the left side.       Posterior tibial pulses are 0 on the right side and 0 on the left side.     Comments: Unable to palpable DP or PT bilaterally  Pulmonary:     Effort: Pulmonary effort is normal.  Abdominal:     General: There is no distension.     Palpations: Abdomen is soft.     Tenderness: There is no abdominal tenderness.  Musculoskeletal:        General: No swelling.     Comments: Left heel lateral black dry eschar, no drainage, no other wounds or ulcers on bilateral feet  Skin:    General: Skin is warm and dry.  Neurological:     General: No focal deficit present.     Mental Status: He is alert and oriented to person, place, and time.  Psychiatric:        Mood and Affect: Mood normal.        Behavior: Behavior normal.        Thought Content: Thought content normal.        Judgment: Judgment normal.     Results: Results for orders placed or performed during the hospital encounter of 04/11/19 (from the past 48 hour(s))  Basic metabolic panel     Status: Abnormal   Collection Time: 04/11/19  9:49 AM  Result Value Ref Range   Sodium 138 135 - 145 mmol/L   Potassium 4.8 3.5 - 5.1 mmol/L   Chloride 105 98 - 111 mmol/L   CO2 23 22 - 32 mmol/L   Glucose, Bld 121 (H) 70 - 99 mg/dL   BUN 26 (H) 6 - 20 mg/dL   Creatinine, Ser 1.61 (H) 0.61 - 1.24 mg/dL   Calcium 9.1 8.9 - 09.6 mg/dL   GFR calc non Af Amer 48 (L) >60 mL/min   GFR calc Af Amer 55 (L) >60 mL/min   Anion gap 10 5 - 15    Comment: Performed at [facility], [address] [place], [place] [address]  CBC with Differential     Status: Abnormal   Collection Time: 04/11/19  9:49 AM  Result Value Ref Range   WBC 5.2 4.0 - 10.5 K/uL   RBC 4.13 (L) 4.22 - 5.81 MIL/uL   Hemoglobin 12.7 (L) 13.0 - 17.0 g/dL   HCT 98.1 19.1 - 47.8 %   MCV 96.1 80.0 - 100.0 fL   MCH 30.8 26.0 - 34.0 pg   MCHC 32.0 30.0 - 36.0 g/dL   RDW 29.5 62.1 - 30.8 %   Platelets 154 150 - 400 K/uL   nRBC 0.0 0.0 - 0.2 %   Neutrophils Relative % 39 %   Neutro Abs 2.1 1.7 - 7.7 K/uL   Lymphocytes Relative 45 %   Lymphs Abs 2.4 0.7 - 4.0 K/uL   Monocytes Relative 12 %   Monocytes Absolute 0.6 0.1 - 1.0 K/uL   Eosinophils Relative  4 %   Eosinophils Absolute 0.2 0.0 - 0.5 K/uL   Basophils Relative 0 %   Basophils Absolute 0.0 0.0 - 0.1 K/uL   Immature Granulocytes 0 %   Abs Immature Granulocytes 0.01 0.00 - 0.07 K/uL    Comment: Performed at [facility], [address] [place], [place] [address]  SARS Coronavirus 2 (CEPHEID - Performed in [facility] hospital lab), Hosp Order     Status: None   Collection Time: 04/11/19 11:09 AM  Result Value Ref Range   SARS Coronavirus 2 NEGATIVE NEGATIVE    Comment: (NOTE) If result is NEGATIVE SARS-CoV-2 target nucleic acids are NOT DETECTED. The SARS-CoV-2 RNA is generally detectable in upper and lower  respiratory specimens during the acute phase of infection. The lowest  concentration of SARS-CoV-2 viral copies this assay can detect is 250  copies / mL. A negative result does not preclude SARS-CoV-2 infection  and should not be used as the sole basis for treatment or other  patient management decisions.  A negative result may occur with  improper specimen collection / handling, submission of specimen other  than nasopharyngeal swab, presence of viral mutation(s) within the  areas targeted by this assay, and inadequate number of viral copies  (<250 copies / mL). A negative result must be combined with clinical  observations, patient  history, and epidemiological information. If result is POSITIVE SARS-CoV-2 target nucleic acids are DETECTED. The SARS-CoV-2 RNA is generally detectable in upper and lower  respiratory specimens dur ing the acute phase of infection.  Positive  results are indicative of active infection with SARS-CoV-2.  Clinical  correlation with patient history and other diagnostic information is  necessary to determine patient infection status.  Positive results do  not rule out bacterial infection or co-infection with other viruses. If result is PRESUMPTIVE POSTIVE SARS-CoV-2 nucleic acids MAY BE PRESENT.   A presumptive positive result was obtained on the submitted specimen  and confirmed on repeat testing.  While 2019 novel coronavirus  (SARS-CoV-2) nucleic acids may be present in the submitted sample  additional confirmatory testing may be necessary for epidemiological  and / or clinical management purposes  to differentiate between  SARS-CoV-2 and other Sarbecovirus currently known to infect humans.  If clinically indicated additional testing with an alternate test  methodology [phone] is advised. The SARS-CoV-2 RNA is generally  detectable in upper and lower respiratory sp ecimens during the acute  phase of infection. The expected result is Negative. Fact Sheet for Patients:  BoilerBrush.com.cyhttps://www.fda.gov/media/136312/download Fact Sheet for Healthcare Providers: https://pope.com/https://www.fda.gov/media/136313/download This test is not yet approved or cleared by the [place] FDA and has been authorized for detection and/or diagnosis of SARS-CoV-2 by FDA under an Emergency Use Authorization (EUA).  This EUA will remain in effect (meaning this test can be used) for the duration of the COVID-19 declaration under Section 564(b)(1) of the Act, 21 U.S.C. section 360bbb-3(b)(1), unless the authorization is terminated or revoked sooner. Performed at [facility], [address] [place], [place] [address]   Culture,  blood (Routine X 2) w Reflex to ID Panel     Status: None (Preliminary result)   Collection Time: 04/11/19 11:09 AM  Result Value Ref Range   Specimen Description      RIGHT ANTECUBITAL BOTTLES DRAWN AEROBIC AND ANAEROBIC   Special Requests      Blood Culture results may not be optimal due to an inadequate volume of blood received in culture bottles   Culture  NO GROWTH < 24 HOURS Performed at [facility], [address] [place], [place] [address]    Report Status PENDING   Culture, blood (Routine X 2) w Reflex to ID Panel     Status: None (Preliminary result)   Collection Time: 04/11/19 11:18 AM  Result Value Ref Range   Specimen Description LEFT ANTECUBITAL Blood Culture adequate volume    Special Requests BOTTLES DRAWN AEROBIC AND ANAEROBIC    Culture      NO GROWTH < 24 HOURS Performed at [facility], [address] [place], [place] [address]    Report Status PENDING   Hemoglobin A1c     Status: Abnormal   Collection Time: 04/11/19 11:19 AM  Result Value Ref Range   Hgb A1c MFr Bld 6.7 (H) 4.8 - 5.6 %    Comment: (NOTE) Pre diabetes:          5.7%-6.4% Diabetes:              >6.4% Glycemic control for   <7.0% adults with diabetes    Mean Plasma Glucose 145.59 mg/dL    Comment: Performed at [facility], [address], [place], [place] [address]  HIV antibody (Routine Testing)     Status: None   Collection Time: 04/11/19 11:19 AM  Result Value Ref Range   HIV Screen 4th Generation wRfx Non Reactive Non Reactive    Comment: (NOTE) Performed At: [facility] [address] [place], [place] [id number] Jolene Schimke MD FX:[id number]   Sedimentation rate     Status: Abnormal   Collection Time: 04/11/19 11:19 AM  Result Value Ref Range   Sed Rate 31 (H) 0 - 16 mm/hr    Comment: Performed at [facility], [address], [place], [place] [address]  C-reactive protein     Status: None   Collection Time: 04/11/19 11:19 AM  Result Value Ref Range   CRP  <0.8 <1.0 mg/dL    Comment: Performed at [facility], [address], [place], [place] [address]  Glucose, capillary     Status: None   Collection Time: 04/11/19  4:36 PM  Result Value Ref Range   Glucose-Capillary 78 70 - 99 mg/dL  Glucose, capillary     Status: Abnormal   Collection Time: 04/11/19  9:03 PM  Result Value Ref Range   Glucose-Capillary 107 (H) 70 - 99 mg/dL  Basic metabolic panel     Status: Abnormal   Collection Time: 04/12/19  5:25 AM  Result Value Ref Range   Sodium 141 135 - 145 mmol/L   Potassium 4.4 3.5 - 5.1 mmol/L   Chloride 105 98 - 111 mmol/L   CO2 26 22 - 32 mmol/L   Glucose, Bld 128 (H) 70 - 99 mg/dL   BUN 22 (H) 6 - 20 mg/dL   Creatinine, Ser 7.68 0.61 - 1.24 mg/dL   Calcium 9.2 8.9 - 08.8 mg/dL   GFR calc non Af Amer >60 >60 mL/min   GFR calc Af Amer >60 >60 mL/min   Anion gap 10 5 - 15    Comment: Performed at [facility], [address] [place], [place] [address]  Glucose, capillary     Status: Abnormal   Collection Time: 04/12/19  7:47 AM  Result Value Ref Range   Glucose-Capillary 109 (H) 70 - 99 mg/dL    US Arterial Abi (screening Lower Extremity)  Result Date: 04/12/2019 CLINICAL DATA:  Smoker, hypertension, left lower extremity injury, pain, diabetes EXAM: NONINVASIVE  PHYSIOLOGIC VASCULAR STUDY OF BILATERAL LOWER EXTREMITIES TECHNIQUE: Evaluation of both lower extremities were performed at rest, including calculation of ankle-brachial indices with single level Doppler, pressure and pulse volume recording. COMPARISON:  11/21/2014 FINDINGS: Right ABI:  1.00 Left ABI:  0.97 Right Lower Extremity:  Biphasic arterial waveforms at the ankle. Left Lower Extremity: Similar biphasic arterial waveforms at the ankle. 1.0-1.4 Normal IMPRESSION: Normal ABIs at rest with some degree nonocclusive peripheral vascular disease bilaterally suspected. Electronically Signed   By: Judie Petit.  Shick M.D.   On: 04/12/2019 10:49   Dg Foot Complete Left  Result Date:  04/11/2019 CLINICAL DATA:  Foot pain, injury 1 month ago. EXAM: LEFT FOOT - COMPLETE 3+ VIEW COMPARISON:  None. FINDINGS: No acute bony abnormality. Specifically, no fracture, subluxation, or dislocation. Mild degenerative changes in the tarsal region. Soft tissues are intact. IMPRESSION: No acute bony abnormality. Electronically Signed   By: Charlett Nose M.D.   On: 04/11/2019 10:22    Assessment & Plan:  [name] is a 63 y.o. male with a black eschar of the left heel that does not need any surgical debridement. He has some degree of PVD due to the diminished pulses. I cannot palpate a DP or PT. The ABIs are reported normal with some degree of nonocclusive disease which goes along with diabetes. He needs further Vascular Workup with Vascular as an outpatient. I have stressed this to the patient and explained the risk of further infections, ulcers, amputations, etc.    -Betadine given to patient to apply betadine to the heel daily and otherwise keep dry -Do not soak the feet -Vascular Outpatient follow up as soon as possible, he uses [facility] for transportation so he needs to be 1 week out from the discharge    All questions were answered to the satisfaction of the patient.   Lucretia Roers 04/12/2019, 2:18 PM

## 2019-04-12 NOTE — Plan of Care (Signed)

## 2019-04-12 NOTE — TOC Transition Note (Signed)
Transition of Care [facility]) - CM/SW Discharge Note   Patient Details  Name: [name] MRN: [id number] Date of Birth: May 12, 1956  Transition of Care [facility]) CM/SW Contact:  Destynee Stringfellow, Chrystine Oiler, RN Phone Number: 04/12/2019, 12:24 PM   Clinical Narrative:   Patient discharging home today. Brief assessment and discussion of home health RN for disease management and education.  Patient from home, independent, lives with mother. Has PCP, uses [facility] transportation. Uses [employer]. No issues obtaining medications.  Agreeable to home health RN, no preference on providers.  Referral given to [facility]   Expected Discharge Plan: Home w Home Health Services Barriers to Discharge: No Barriers Identified   Patient Goals and CMS Choice Patient states their goals for this hospitalization and ongoing recovery are:: have no pain  CMS Medicare.gov Compare Post Acute Care list provided to:: Patient Choice offered to / list presented to : Patient  Expected Discharge Plan and Services Expected Discharge Plan: Home w Home Health Services   Discharge Planning Services: CM Consult Post Acute Care Choice: Home Health Living arrangements for the past 2 months: Single Family Home Expected Discharge Date: 04/12/19                         HH Arranged: RN, Disease Management HH Agency: [facility] Date HH Agency Contacted: 04/12/19 Time HH Agency Contacted: [phone]    Prior Living Arrangements/Services Living arrangements for the past 2 months: Single Family Home Lives with:: Other (Comment)(mother) Patient language and need for interpreter reviewed:: Yes Do you feel safe going back to the place where you live?: Yes      Need for Family Participation in Patient Care: No (Comment) Care giver support system in place?: No (comment)   Criminal Activity/Legal Involvement Pertinent to Current Situation/Hospitalization: No - Comment as  needed  Activities of Daily Living Home Assistive Devices/Equipment: Cane (specify quad or straight) ADL Screening (condition at time of admission) Patient's cognitive ability adequate to safely complete daily activities?: Yes Is the patient deaf or have difficulty hearing?: No Does the patient have difficulty seeing, even when wearing glasses/contacts?: No Does the patient have difficulty concentrating, remembering, or making decisions?: No Patient able to express need for assistance with ADLs?: No Does the patient have difficulty dressing or bathing?: No Independently performs ADLs?: Yes (appropriate for developmental age) Does the patient have difficulty walking or climbing stairs?: No Weakness of Legs: None Weakness of Arms/Hands: None  Permission Sought/Granted   Permission granted to share information with : Yes, Verbal Permission Granted     Permission granted to share info w AGENCY: [facility]        Emotional Assessment Appearance:: Appears stated age Attitude/Demeanor/Rapport: Engaged Affect (typically observed): Accepting        Admission diagnosis:  Diabetic infection of left foot (HCC) [B63.845, L08.9] Patient Active Problem List   Diagnosis Date Noted  . Peripheral vascular disease (HCC)   . Diabetic foot infection (HCC) 04/11/2019  . Acute renal failure superimposed on stage 2 chronic kidney disease (HCC) 04/11/2019  . Nasal turbinate hypertrophy 08/04/2017  . Chronic cough 06/22/2017  . Laryngopharyngeal reflux (LPR) 06/22/2017  . TIA (transient ischemic attack) 07/13/2011  . Cerebrovascular disease 07/13/2011  . Tobacco abuse 07/12/2011  . Hyperkalemia 07/12/2011  . ATHEROSCLEROTIC CARDIOVASCULAR DISEASE 08/21/2009  . DIABETES MELLITUS 05/12/2009  . Mixed hyperlipidemia 05/12/2009  . OVERWEIGHT/OBESITY 05/12/2009  . OBSTRUCTIVE SLEEP APNEA 05/12/2009  . HYPERTENSION, UNSPECIFIED 05/12/2009  .  DIASTOLIC HEART FAILURE, CHRONIC 05/12/2009   PCP:  Fleet ContrasAvbuere,  Edwin, MD Pharmacy:   [facility] [place] [place] - [address] [place] [address] Phone: [phone] Fax: [phone]

## 2019-04-12 NOTE — Telephone Encounter (Signed)
Wanted clarification they he should stop Zestoretic per ED discharge summary

## 2019-04-12 NOTE — Plan of Care (Signed)
  Problem: Education: Goal: Knowledge of General Education information will improve Description Including pain rating scale, medication(s)/side effects and non-pharmacologic comfort measures 04/12/2019 1302 by Darreld Mclean, RN Outcome: Adequate for Discharge 04/12/2019 1113 by Darreld Mclean, RN Outcome: Progressing   Problem: Health Behavior/Discharge Planning: Goal: Ability to manage health-related needs will improve 04/12/2019 1302 by Darreld Mclean, RN Outcome: Adequate for Discharge 04/12/2019 1113 by Darreld Mclean, RN Outcome: Progressing   Problem: Clinical Measurements: Goal: Ability to maintain clinical measurements within normal limits will improve 04/12/2019 1302 by Darreld Mclean, RN Outcome: Adequate for Discharge 04/12/2019 1113 by Darreld Mclean, RN Outcome: Progressing Goal: Will remain free from infection 04/12/2019 1302 by Darreld Mclean, RN Outcome: Adequate for Discharge 04/12/2019 1113 by Darreld Mclean, RN Outcome: Progressing Goal: Diagnostic test results will improve 04/12/2019 1302 by Darreld Mclean, RN Outcome: Adequate for Discharge 04/12/2019 1113 by Darreld Mclean, RN Outcome: Progressing Goal: Respiratory complications will improve 04/12/2019 1302 by Darreld Mclean, RN Outcome: Adequate for Discharge 04/12/2019 1113 by Darreld Mclean, RN Outcome: Progressing Goal: Cardiovascular complication will be avoided 04/12/2019 1302 by Darreld Mclean, RN Outcome: Adequate for Discharge 04/12/2019 1113 by Darreld Mclean, RN Outcome: Progressing   Problem: Activity: Goal: Risk for activity intolerance will decrease 04/12/2019 1302 by Darreld Mclean, RN Outcome: Adequate for Discharge 04/12/2019 1113 by Darreld Mclean, RN Outcome: Progressing   Problem: Nutrition: Goal: Adequate nutrition will be maintained 04/12/2019 1302 by Darreld Mclean, RN Outcome: Adequate for Discharge 04/12/2019 1113 by Darreld Mclean, RN Outcome: Progressing   Problem: Coping: Goal: Level of anxiety will decrease 04/12/2019 1302 by Darreld Mclean, RN Outcome: Adequate for Discharge 04/12/2019  1113 by Darreld Mclean, RN Outcome: Progressing   Problem: Elimination: Goal: Will not experience complications related to bowel motility 04/12/2019 1302 by Darreld Mclean, RN Outcome: Adequate for Discharge 04/12/2019 1113 by Darreld Mclean, RN Outcome: Progressing Goal: Will not experience complications related to urinary retention 04/12/2019 1302 by Darreld Mclean, RN Outcome: Adequate for Discharge 04/12/2019 1113 by Darreld Mclean, RN Outcome: Progressing   Problem: Pain Managment: Goal: General experience of comfort will improve 04/12/2019 1302 by Darreld Mclean, RN Outcome: Adequate for Discharge 04/12/2019 1113 by Darreld Mclean, RN Outcome: Progressing   Problem: Safety: Goal: Ability to remain free from injury will improve 04/12/2019 1302 by Darreld Mclean, RN Outcome: Adequate for Discharge 04/12/2019 1113 by Darreld Mclean, RN Outcome: Progressing   Problem: Skin Integrity: Goal: Risk for impaired skin integrity will decrease 04/12/2019 1302 by Darreld Mclean, RN Outcome: Adequate for Discharge 04/12/2019 1113 by Darreld Mclean, RN Outcome: Progressing   Problem: Skin Integrity: Goal: Risk for impaired skin integrity will decrease 04/12/2019 1302 by Darreld Mclean, RN Outcome: Adequate for Discharge 04/12/2019 1113 by Darreld Mclean, RN Outcome: Progressing

## 2019-04-12 NOTE — Discharge Summary (Signed)
Physician Discharge Summary  [name] YTK:[id number] DOB: 1956/02/03 DOA: 04/11/2019  PCP: Nolene Ebbs, MD  Admit date: 04/11/2019 Discharge date: 04/12/2019  Admitted From: Home Disposition:  Home   Recommendations for Outpatient Follow-up:  1. Follow up with PCP in 1-2 weeks 2. Please obtain BMP/CBC in one week   Home Health: YES Equipment/Devices: HHRN  Discharge Condition: Stable CODE STATUS: FULL Diet recommendation: Heart Healthy   Brief/Interim Summary: 63 y.o. male with medical history of hypertension, diastolic CHF, diabetes mellitus, coronary artery disease, hyperlipidemia presenting with left heel pain x2 to 3 days.  The patient states that he hit his left foot against a car door approximately 1 month prior to this admission.  He denied any broken skin, bleeding, or drainage from the left foot.  He states that he had some soreness in his left heel that has gradually worsened over this past month, but was significantly worse over the last 3 days affecting his ambulation.  As result, the patient presented for further evaluation.  He denied any increasing erythema, drainage, or new injuries to his left foot since the initial event.  He denied any fevers, chills, chest pain, shortness breath, coughing, hemoptysis, nausea, vomiting, diarrhea, abdominal pain, dysuria, hematuria.  The patient states that he has not been back to see his primary care physician for nearly 79-monthsecondary to the [place] Leepandemic.  He states that he does not check his feet every day, but he has been soaking his left foot in salt water at least once or twice a week since the initial injury.  Because of increasing pain with weightbearing, he presented for further evaluation. In the emergency department, the patient was afebrile hemodynamically stable saturating 98% on room air.  BMP showed a serum creatinine of 1.56 which is above his usual baseline.  CBC was unremarkable.  Because the patient has  unreliable follow-up and appears to have poor health literacy, observation admission was requested.  Discharge Diagnoses:  Left heel eschar/foot pain -Does not appear to be clinically infected -ESR 31 -CRP <0.8 -Personally reviewed left foot x-ray--no bony abnormality -Hold off on any antibiotics as the patient is afebrile and hemodynamically stable without any leukocytosis -General surgery consult appreciated--no need for surgery -paint heel with betadine at least once daily and keep dry -tramadol prn pain -ABIs--normal at rest with some degree of nonocclusive peripheral vascular disease bilaterally  Acute on chronic renal failure--CKD stage II -Baseline creatinine 1.2-1.3 -Presented with serum creatinine 17.51-Patient certainly may have progression of his underlying CKD -IV fluids x24 hours -serum creatinine 1.21 on day of d/c  Diabetes mellitus type 2 -Hemoglobin A1c--6.7 -NovoLog sliding scale -Start reduced dose Lantus during the hospitalization -Holding Trulicity and Invokana--restart after d/c  Essential hypertension -Holding lisinopril/HCTZ in the setting of elevated serum creatinine -will not restart lisino/HCTZ due to risk of recurrent acute on chronic kidney injury -start amlodipine -Continue metoprolol succinate  Tobacco abuse -Tobacco cessation discussed  Hyperlipidemia -Continue statin  Coronary artery disease -No chest pain presently -Status post CABG June 2009 -Continue metoprolol and Lipitor -Restart aggrenox  Diabetic polyneuropathy -Continue gabapentin  Chronic diastolic CHF -67/00/17Echo 55% -clinically euvolemic    Discharge Instructions   Allergies as of 04/12/2019   No Known Allergies     Medication List    STOP taking these medications   lisinopril-hydrochlorothiazide 20-12.5 MG tablet Commonly known as:  ZESTORETIC     TAKE these medications   amLODipine 5 MG tablet Commonly known as:  NORVASC  Take 1 tablet (5 mg  total) by mouth daily.   atorvastatin 80 MG tablet Commonly known as:  LIPITOR Take 1 tablet (80 mg total) by mouth daily.   dipyridamole-aspirin 200-25 MG 12hr capsule Commonly known as:  AGGRENOX Take 1 capsule by mouth 2 (two) times daily.   fluticasone 50 MCG/ACT nasal spray Commonly known as:  FLONASE 2 sprays by Each Nare route daily.   gabapentin 400 MG capsule Commonly known as:  NEURONTIN Take 400 mg by mouth 3 (three) times daily.   Invokana 300 MG Tabs tablet Generic drug:  canagliflozin Take 300 mg by mouth daily before breakfast.   Lantus SoloStar 100 UNIT/ML Solostar Pen Generic drug:  Insulin Glargine Inject 10 Units into the skin 2 (two) times daily.   metoprolol succinate 50 MG 24 hr tablet Commonly known as:  TOPROL-XL TAKE 1 AND 1/2 TABLETS BY MOUTH ONCE DAILY.   tiotropium 18 MCG inhalation capsule Commonly known as:  SPIRIVA Place 1 capsule (18 mcg total) into inhaler and inhale daily.   traMADol 50 MG tablet Commonly known as:  ULTRAM Take 1 tablet (50 mg total) by mouth every 6 (six) hours as needed for moderate pain.   Trulicity 1.5 IE/3.3IR Sopn Generic drug:  Dulaglutide Inject 1.5 mg into the skin once a week. On fridays       No Known Allergies  Consultations:  General surgery   Procedures/Studies: US Arterial Abi (screening Lower Extremity)  Result Date: 04/12/2019 CLINICAL DATA:  Smoker, hypertension, left lower extremity injury, pain, diabetes EXAM: NONINVASIVE PHYSIOLOGIC VASCULAR STUDY OF BILATERAL LOWER EXTREMITIES TECHNIQUE: Evaluation of both lower extremities were performed at rest, including calculation of ankle-brachial indices with single level Doppler, pressure and pulse volume recording. COMPARISON:  11/21/2014 FINDINGS: Right ABI:  1.00 Left ABI:  0.97 Right Lower Extremity:  Biphasic arterial waveforms at the ankle. Left Lower Extremity: Similar biphasic arterial waveforms at the ankle. 1.0-1.4 Normal IMPRESSION: Normal  ABIs at rest with some degree nonocclusive peripheral vascular disease bilaterally suspected. Electronically Signed   By: Jerilynn Mages.  Shick M.D.   On: 04/12/2019 10:49   Dg Foot Complete Left  Result Date: 04/11/2019 CLINICAL DATA:  Foot pain, injury 1 month ago. EXAM: LEFT FOOT - COMPLETE 3+ VIEW COMPARISON:  None. FINDINGS: No acute bony abnormality. Specifically, no fracture, subluxation, or dislocation. Mild degenerative changes in the tarsal region. Soft tissues are intact. IMPRESSION: No acute bony abnormality. Electronically Signed   By: Rolm Baptise M.D.   On: 04/11/2019 10:22         Discharge Exam: Vitals:   04/12/19 0521 04/12/19 0920  BP: 139/88   Pulse: 60   Resp: (!) 24   Temp: 97.9 F (36.6 C)   SpO2: 100% 99%   Vitals:   04/11/19 2046 04/11/19 2204 04/12/19 0521 04/12/19 0920  BP:  (!) 157/83 139/88   Pulse:  64 60   Resp:  (!) 24 (!) 24   Temp:  97.6 F (36.4 C) 97.9 F (36.6 C)   TempSrc:  Oral Oral   SpO2: 97% 98% 100% 99%  Weight:      Height:        General: Pt is alert, awake, not in acute distress Cardiovascular: RRR, S1/S2 +, no rubs, no gallops Respiratory: CTA bilaterally, no wheezing, no rhonchi Abdominal: Soft, NT, ND, bowel sounds + Extremities: eschar left heel without erythema or drainage   The results of significant diagnostics from this hospitalization (including imaging, microbiology, ancillary and laboratory)  are listed below for reference.    Significant Diagnostic Studies: US Arterial Abi (screening Lower Extremity)  Result Date: 04/12/2019 CLINICAL DATA:  Smoker, hypertension, left lower extremity injury, pain, diabetes EXAM: NONINVASIVE PHYSIOLOGIC VASCULAR STUDY OF BILATERAL LOWER EXTREMITIES TECHNIQUE: Evaluation of both lower extremities were performed at rest, including calculation of ankle-brachial indices with single level Doppler, pressure and pulse volume recording. COMPARISON:  11/21/2014 FINDINGS: Right ABI:  1.00 Left ABI:  0.97  Right Lower Extremity:  Biphasic arterial waveforms at the ankle. Left Lower Extremity: Similar biphasic arterial waveforms at the ankle. 1.0-1.4 Normal IMPRESSION: Normal ABIs at rest with some degree nonocclusive peripheral vascular disease bilaterally suspected. Electronically Signed   By: Jerilynn Mages.  Shick M.D.   On: 04/12/2019 10:49   Dg Foot Complete Left  Result Date: 04/11/2019 CLINICAL DATA:  Foot pain, injury 1 month ago. EXAM: LEFT FOOT - COMPLETE 3+ VIEW COMPARISON:  None. FINDINGS: No acute bony abnormality. Specifically, no fracture, subluxation, or dislocation. Mild degenerative changes in the tarsal region. Soft tissues are intact. IMPRESSION: No acute bony abnormality. Electronically Signed   By: Rolm Baptise M.D.   On: 04/11/2019 10:22     Microbiology: Recent Results (from the past 240 hour(s))  SARS Coronavirus 2 (CEPHEID - Performed in [place] hospital lab), Hosp Order     Status: None   Collection Time: 04/11/19 11:09 AM  Result Value Ref Range Status   SARS Coronavirus 2 NEGATIVE NEGATIVE Final    Comment: (NOTE) If result is NEGATIVE SARS-CoV-2 target nucleic acids are NOT DETECTED. The SARS-CoV-2 RNA is generally detectable in upper and lower  respiratory specimens during the acute phase of infection. The lowest  concentration of SARS-CoV-2 viral copies this assay can detect is 250  copies / mL. A negative result does not preclude SARS-CoV-2 infection  and should not be used as the sole basis for treatment or other  patient management decisions.  A negative result may occur with  improper specimen collection / handling, submission of specimen other  than nasopharyngeal swab, presence of viral mutation(s) within the  areas targeted by this assay, and inadequate number of viral copies  (<250 copies / mL). A negative result must be combined with clinical  observations, patient history, and epidemiological information. If result is POSITIVE SARS-CoV-2 target nucleic acids  are DETECTED. The SARS-CoV-2 RNA is generally detectable in upper and lower  respiratory specimens dur ing the acute phase of infection.  Positive  results are indicative of active infection with SARS-CoV-2.  Clinical  correlation with patient history and other diagnostic information is  necessary to determine patient infection status.  Positive results do  not rule out bacterial infection or co-infection with other viruses. If result is PRESUMPTIVE POSTIVE SARS-CoV-2 nucleic acids MAY BE PRESENT.   A presumptive positive result was obtained on the submitted specimen  and confirmed on repeat testing.  While 2019 novel coronavirus  (SARS-CoV-2) nucleic acids may be present in the submitted sample  additional confirmatory testing may be necessary for epidemiological  and / or clinical management purposes  to differentiate between  SARS-CoV-2 and other Sarbecovirus currently known to infect humans.  If clinically indicated additional testing with an alternate test  methodology [id number]) is advised. The SARS-CoV-2 RNA is generally  detectable in upper and lower respiratory sp ecimens during the acute  phase of infection. The expected result is Negative. Fact Sheet for Patients:  StrictlyIdeas.no Fact Sheet for Healthcare Providers: BankingDealers.co.za This test is not yet approved or  cleared by the [place] and has been authorized for detection and/or diagnosis of SARS-CoV-2 by FDA under an Emergency Use Authorization (EUA).  This EUA will remain in effect (meaning this test can be used) for the duration of the COVID-19 declaration under Section 564(b)(1) of the Act, 21 U.S.C. section 360bbb-3(b)(1), unless the authorization is terminated or revoked sooner. Performed at [facility], [address], [place], [place] [address]   Culture, blood (Routine X 2) w Reflex to ID Panel     Status: None (Preliminary result)   Collection  Time: 04/11/19 11:09 AM  Result Value Ref Range Status   Specimen Description   Final    RIGHT ANTECUBITAL BOTTLES DRAWN AEROBIC AND ANAEROBIC   Special Requests   Final    Blood Culture results may not be optimal due to an inadequate volume of blood received in culture bottles   Culture   Final    NO GROWTH < 24 HOURS Performed at [facility], [address], [place], [place] [address]    Report Status PENDING  Incomplete  Culture, blood (Routine X 2) w Reflex to ID Panel     Status: None (Preliminary result)   Collection Time: 04/11/19 11:18 AM  Result Value Ref Range Status   Specimen Description LEFT ANTECUBITAL Blood Culture adequate volume  Final   Special Requests BOTTLES DRAWN AEROBIC AND ANAEROBIC  Final   Culture   Final    NO GROWTH < 24 HOURS Performed at [facility], [address] [place],  [address]    Report Status PENDING  Incomplete     Labs: Basic Metabolic Panel: Recent Labs  Lab 04/11/19 0949 04/12/19 0525  NA 138 141  K 4.8 4.4  CL 105 105  CO2 23 26  GLUCOSE 121* 128*  BUN 26* 22*  CREATININE 1.56* 1.21  CALCIUM 9.1 9.2   Liver Function Tests: No results for input(s): AST, ALT, ALKPHOS, BILITOT, PROT, ALBUMIN in the last 168 hours. No results for input(s): LIPASE, AMYLASE in the last 168 hours. No results for input(s): AMMONIA in the last 168 hours. CBC: Recent Labs  Lab 04/11/19 0949  WBC 5.2  NEUTROABS 2.1  HGB 12.7*  HCT 39.7  MCV 96.1  PLT 154   Cardiac Enzymes: No results for input(s): CKTOTAL, CKMB, CKMBINDEX, TROPONINI in the last 168 hours. BNP: Invalid input(s): POCBNP CBG: Recent Labs  Lab 04/11/19 1636 04/11/19 2103 04/12/19 0747  GLUCAP 78 107* 109*    Time coordinating discharge:  36 minutes  Signed:  Orson Eva, DO [facility] Pager: [phone] 04/12/2019, 12:09 PM

## 2019-04-15 ENCOUNTER — Other Ambulatory Visit: Payer: Self-pay | Admitting: Acute Care

## 2019-04-15 DIAGNOSIS — Z122 Encounter for screening for malignant neoplasm of respiratory organs: Secondary | ICD-10-CM

## 2019-04-15 DIAGNOSIS — Z87891 Personal history of nicotine dependence: Secondary | ICD-10-CM

## 2019-04-16 LAB — CULTURE, BLOOD (ROUTINE X 2)
Culture: NO GROWTH
Culture: NO GROWTH
Specimen Description: ADEQUATE

## 2019-04-25 ENCOUNTER — Other Ambulatory Visit: Payer: Self-pay | Admitting: *Deleted

## 2019-04-25 DIAGNOSIS — Z20822 Contact with and (suspected) exposure to covid-19: Secondary | ICD-10-CM

## 2019-04-29 LAB — SPECIMEN STATUS REPORT

## 2019-04-29 LAB — NOVEL CORONAVIRUS, NAA: SARS-CoV-2, NAA: NOT DETECTED

## 2019-05-20 ENCOUNTER — Ambulatory Visit: Payer: Medicaid Other

## 2019-05-20 ENCOUNTER — Encounter: Payer: Self-pay | Admitting: Radiology

## 2019-05-21 ENCOUNTER — Ambulatory Visit
Admission: RE | Admit: 2019-05-21 | Discharge: 2019-05-21 | Disposition: A | Discharge status: Home / Self Care | Payer: No Typology Code available for payment source | Source: Ambulatory Visit | Attending: Acute Care | Admitting: Acute Care

## 2019-05-21 ENCOUNTER — Other Ambulatory Visit: Payer: Self-pay

## 2019-05-21 DIAGNOSIS — Z87891 Personal history of nicotine dependence: Secondary | ICD-10-CM

## 2019-05-21 DIAGNOSIS — Z122 Encounter for screening for malignant neoplasm of respiratory organs: Secondary | ICD-10-CM

## 2019-06-13 ENCOUNTER — Encounter: Payer: Self-pay | Admitting: Cardiology

## 2019-06-13 ENCOUNTER — Other Ambulatory Visit: Payer: Self-pay

## 2019-06-13 ENCOUNTER — Ambulatory Visit (INDEPENDENT_AMBULATORY_CARE_PROVIDER_SITE_OTHER): Payer: Medicaid Other | Admitting: Cardiology

## 2019-06-13 VITALS — BP 124/67 | HR 81 | Temp 97.7°F | Ht 73.0 in | Wt 274.0 lb

## 2019-06-13 DIAGNOSIS — I2510 Atherosclerotic heart disease of native coronary artery without angina pectoris: Secondary | ICD-10-CM | POA: Diagnosis not present

## 2019-06-13 DIAGNOSIS — I10 Essential (primary) hypertension: Secondary | ICD-10-CM | POA: Diagnosis not present

## 2019-06-13 DIAGNOSIS — E782 Mixed hyperlipidemia: Secondary | ICD-10-CM

## 2019-06-13 NOTE — Patient Instructions (Signed)
Medication Instructions:  START ASPIRIN 81 MG DAILY   Labwork: I WILL REQUEST LABS FROM PCP.   Testing/Procedures: NONE  Follow-Up: Your physician wants you to follow-up in: 6 MONTHS.  You will receive a reminder letter in the mail two months in advance. If you don't receive a letter, please call our office to schedule the follow-up appointment.   Any Other Special Instructions Will Be Listed Below (If Applicable).     If you need a refill on your cardiac medications before your next appointment, please call your pharmacy.   

## 2019-06-13 NOTE — Progress Notes (Signed)
Clinical Summary [name] is a 63 y.o.male seen today for follow up of the following medical problems.   1. 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  - echo 07/2011 LVEF 45-50% with inferior hypokinesis.    - no recent chest pain. No SOB or DOE - compliant with meds  2. HTN - HCTZ/lisinopril stopped during 04/2019 admission with mild AKI, started on norvasc 5mg  daily insteas - compliant with meds    3. Hyperlipidemia -12/2016 TC 119 TG 125 HDL 47 LDL 47 - labs followed by pcp - compliant with meds  4.History ofCVA - on aggrenox for secondary prevention, as well as statin. ACE-I stopped 04/2019 during admission with AKI  5. CKD II - admit 04/2019 with AKI on CKD, Cr up to 1.56. Lisionpril/HCTZ was stopped, started on norvasc instead during that admission   SH: works as Dealer  Past Medical History:  Diagnosis Date  . Coronary artery disease   . Diabetes mellitus   . Diastolic heart failure   . Hyperlipidemia   . Hypertension   . Obesity   . OSA on CPAP      No Known Allergies   Current Outpatient Medications  Medication Sig Dispense Refill  . amLODipine (NORVASC) 5 MG tablet Take 1 tablet (5 mg total) by mouth daily. 30 tablet 1  . atorvastatin (LIPITOR) 80 MG tablet Take 1 tablet (80 mg total) by mouth daily. 90 tablet 3  . canagliflozin (INVOKANA) 300 MG TABS tablet Take 300 mg by mouth daily before breakfast.    . dipyridamole-aspirin (AGGRENOX) 200-25 MG per 12 hr capsule Take 1 capsule by mouth 2 (two) times daily.    . Dulaglutide (TRULICITY) 1.5 VV/6.1YW SOPN Inject 1.5 mg into the skin once a week. On fridays    . fluticasone (FLONASE) 50 MCG/ACT nasal spray 2 sprays by Each Nare route daily.    Marland Kitchen gabapentin (NEURONTIN) 400 MG capsule Take 400 mg by mouth 3 (three) times daily.    . Insulin Glargine (LANTUS SOLOSTAR) 100 UNIT/ML Solostar Pen Inject 10 Units into the skin 2 (two) times daily.     .  metoprolol succinate (TOPROL-XL) 50 MG 24 hr tablet TAKE 1 AND 1/2 TABLETS BY MOUTH ONCE DAILY. 45 tablet 11  . tiotropium (SPIRIVA) 18 MCG inhalation capsule Place 1 capsule (18 mcg total) into inhaler and inhale daily. 30 capsule 6  . traMADol (ULTRAM) 50 MG tablet Take 1 tablet (50 mg total) by mouth every 6 (six) hours as needed for moderate pain. 12 tablet 0   No current facility-administered medications for this visit.      Past Surgical History:  Procedure Laterality Date  . CARDIAC CATHETERIZATION    . NO PAST SURGERIES       No Known Allergies    Family History  Problem Relation Age of Onset  . Diabetes Father   . Hypertension Father      Social History [name] reports that he has been smoking cigarettes. He has a 15.00 pack-year smoking history. He has never used smokeless tobacco. [name] reports current alcohol use of about 4.0 standard drinks of alcohol per week.   Review of Systems CONSTITUTIONAL: No weight loss, fever, chills, weakness or fatigue.  HEENT: Eyes: No visual loss, blurred vision, double vision or yellow sclerae.No hearing loss, sneezing, congestion, runny nose or sore throat.  SKIN: No rash or itching.  CARDIOVASCULAR: per hpi RESPIRATORY: No shortness of breath,  cough or sputum.  GASTROINTESTINAL: No anorexia, nausea, vomiting or diarrhea. No abdominal pain or blood.  GENITOURINARY: No burning on urination, no polyuria NEUROLOGICAL: No headache, dizziness, syncope, paralysis, ataxia, numbness or tingling in the extremities. No change in bowel or bladder control.  MUSCULOSKELETAL: No muscle, back pain, joint pain or stiffness.  LYMPHATICS: No enlarged nodes. No history of splenectomy.  PSYCHIATRIC: No history of depression or anxiety.  ENDOCRINOLOGIC: No reports of sweating, cold or heat intolerance. No polyuria or polydipsia.  Marland Kitchen.   Physical Examination Today's Vitals   06/13/19 1047  BP: 124/67  Pulse: 81  Temp: 97.7 F (36.5  C)  TempSrc: Temporal  SpO2: 95%  Weight: 274 lb (124.3 kg)  Height: 6\' 1"  (1.854 m)   Body mass index is 36.15 kg/m.  Gen: resting comfortably, no acute distress HEENT: no scleral icterus, pupils equal round and reactive, no palptable cervical adenopathy,  CV: RRR, no m/rg/, no jvd Resp: Clear to auscultation bilaterally GI: abdomen is soft, non-tender, non-distended, normal bowel sounds, no hepatosplenomegaly MSK: extremities are warm, no edema.  Skin: warm, no rash Neuro:  no focal deficits Psych: appropriate affect   Diagnostic Studies   07/2011 Echo LVEF 45-50%, mod biatrial enlargement, diastolic function not described.   05/2012 Carotid US IMPRESSION: Bilateral atherosclerosis, not resulting in hemodynamically significant stenosis.  Jan 2016 ABI IMPRESSION: Normal ABIs at rest. Nonocclusive peripheral vascular disease, noncompressible in the left thigh and calf regions.   Assessment and Plan  1. CAD - doing well without symptoms, continue current meds - EKG today SR, no acute iscemic changes - with aggrenox only getting 50mg  of ASA, from cardiac standpoint needs at least 81mg  daily, asked to start 81mg  ASA along with his aggrenox  2. HTN - at goal, continue current meds. Off HCTZ/Lisinopril due to recent AKI  3. Hyperlipidemia - continue statin, request labs from pcp    F/u 6 months      [name], M.D.

## 2019-07-20 IMAGING — CT CT CHEST LUNG CANCER SCREENING LOW DOSE
1 series · 15 of 33 positions shown, 19 images · non-contrast
Comparison: 02/09/2018 screening chest CT.

CLINICAL DATA: 62-year-old asymptomatic male former smoker with 44
pack-year smoking history.

EXAM:
CT CHEST WITHOUT CONTRAST LOW-DOSE FOR LUNG CANCER SCREENING
TECHNIQUE: Multidetector CT imaging of the chest was performed following the
standard protocol without IV contrast.

[Series 2: ldct screen -soft · axial · 0.94mm/px · z∈[-395,-80]mm · 15 of 75 slices shown, 19 images]
[im 6/75  mediastinal]
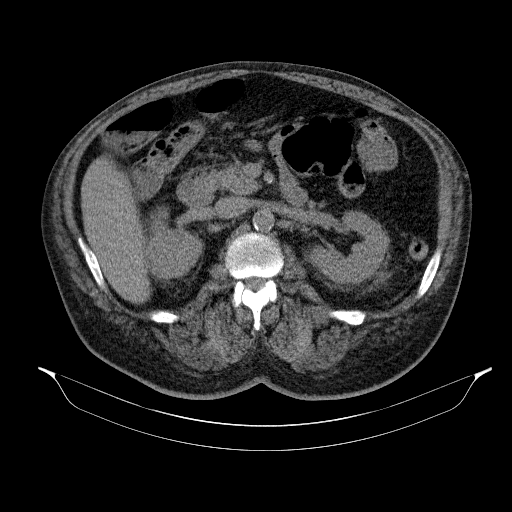
[im 6/75  lung]
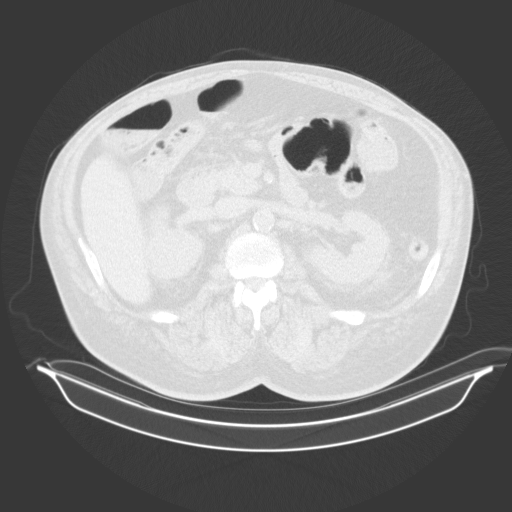
[im 11/75  lung]
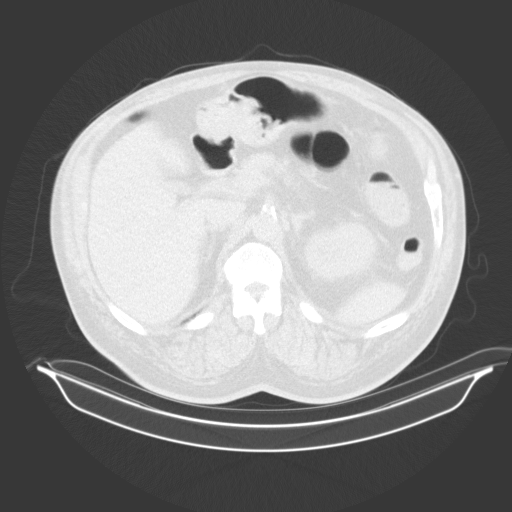
[im 15/75  lung]
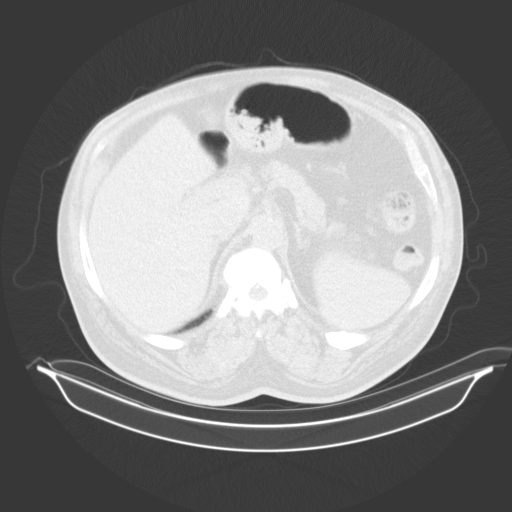
[im 20/75  lung]
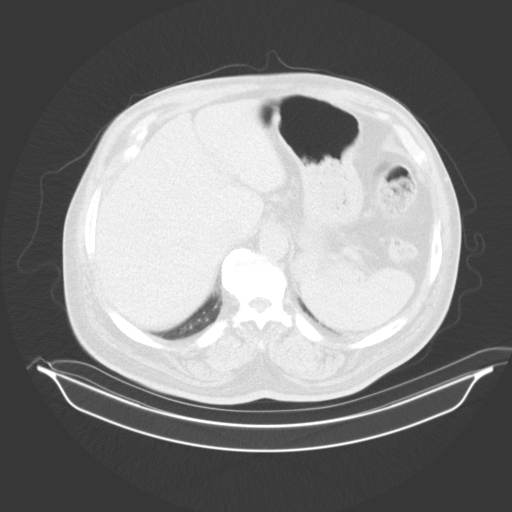
[im 25/75  mediastinal]
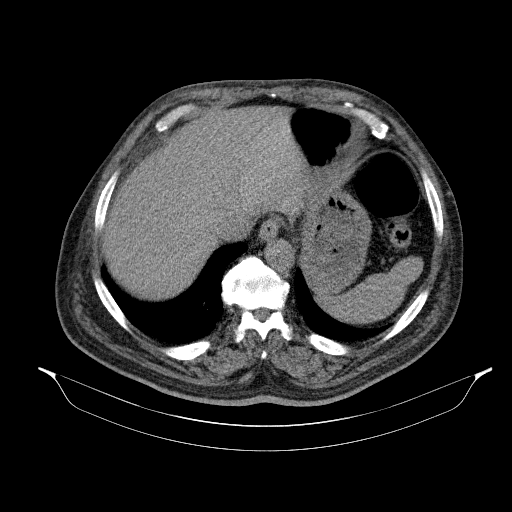
[im 25/75  lung]
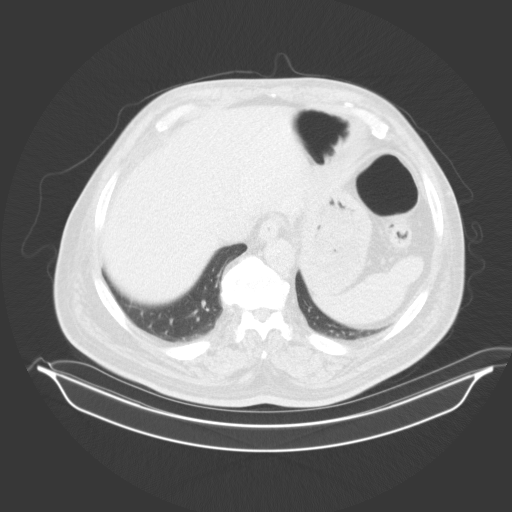
[im 30/75  lung]
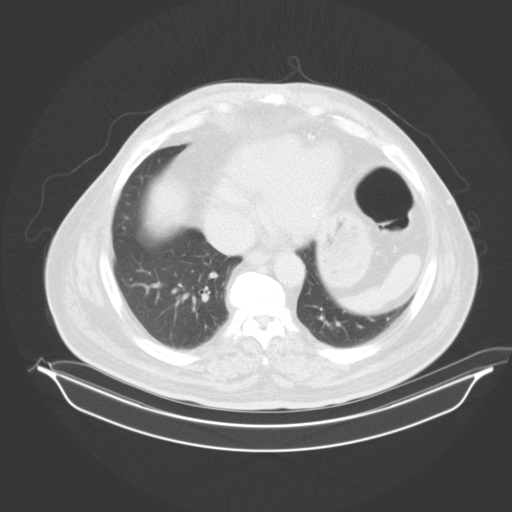
[im 33/75  lung]
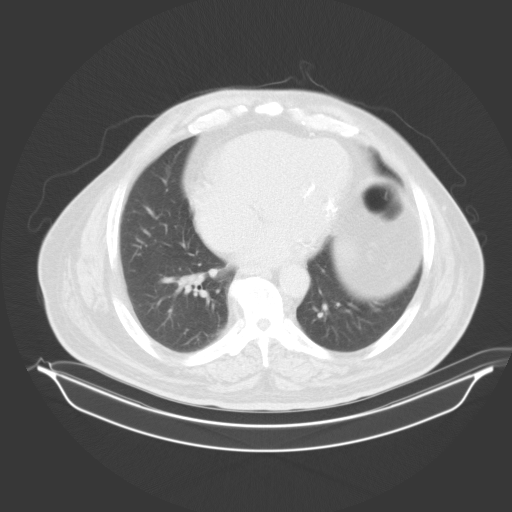
[im 39/75  lung]
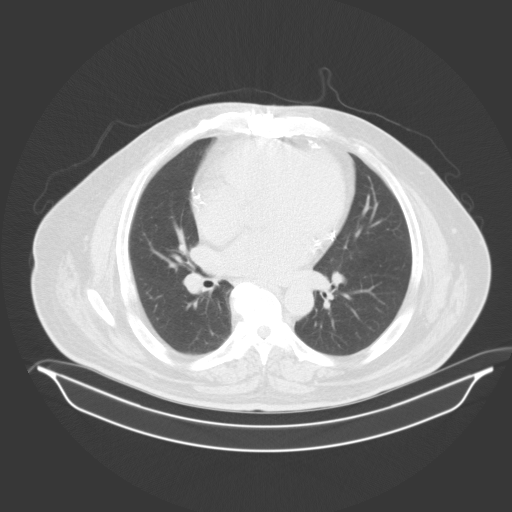
[im 42/75  mediastinal]
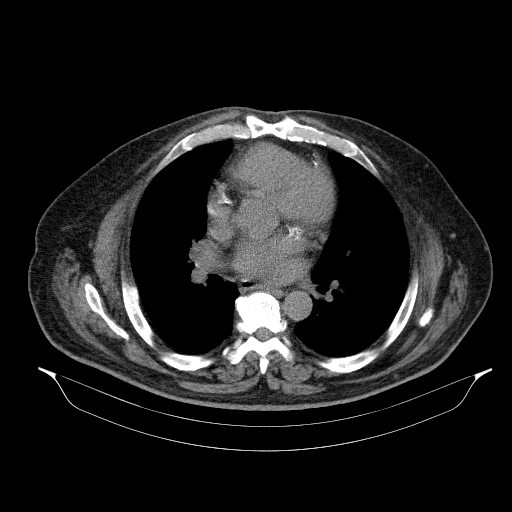
[im 42/75  lung]
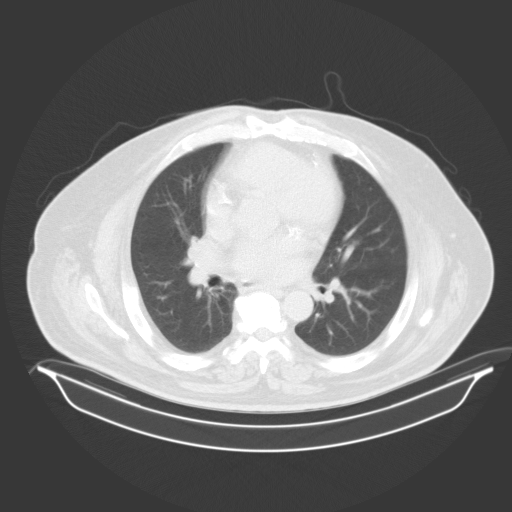
[im 45/75  lung]
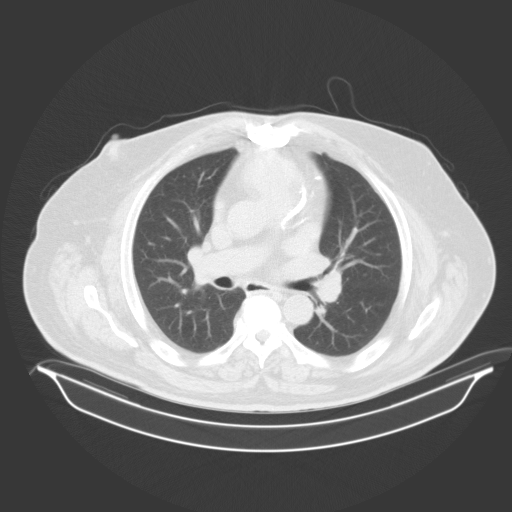
[im 50/75  lung]
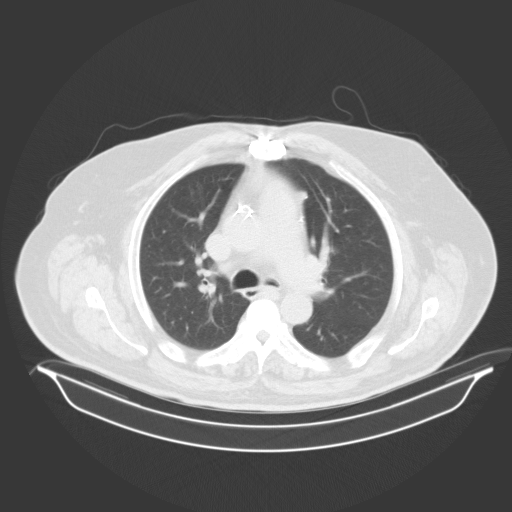
[im 55/75  lung]
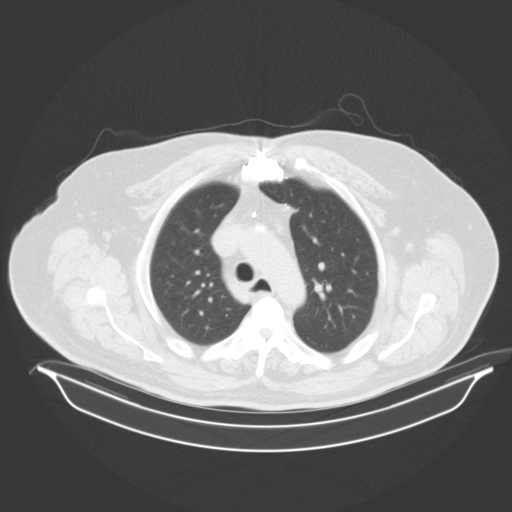
[im 60/75  mediastinal]
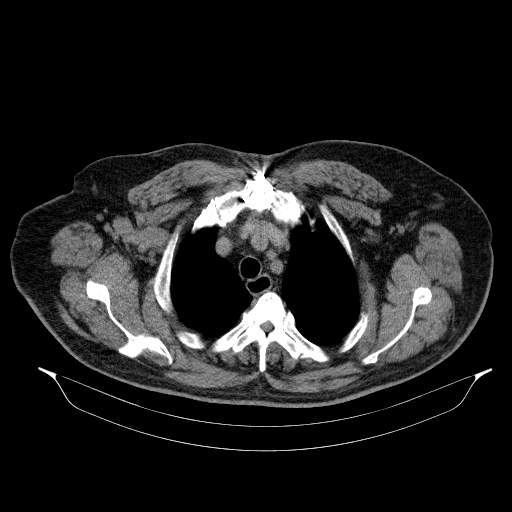
[im 60/75  lung]
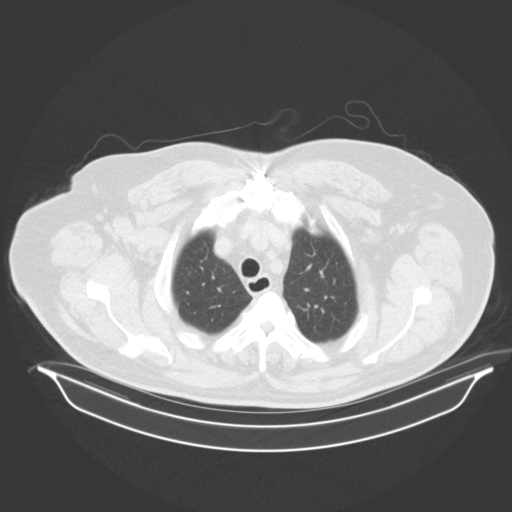
[im 64/75  lung]
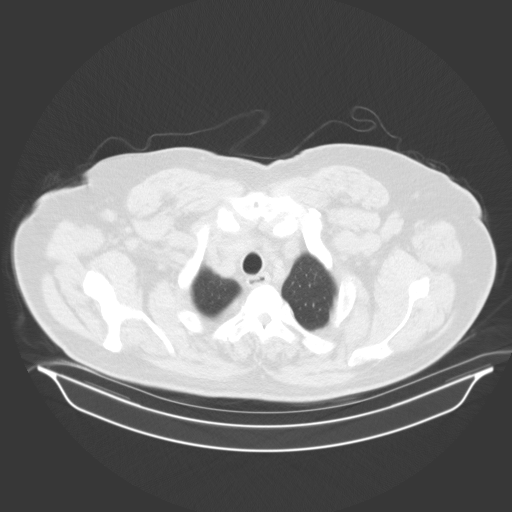
[im 69/75  lung]
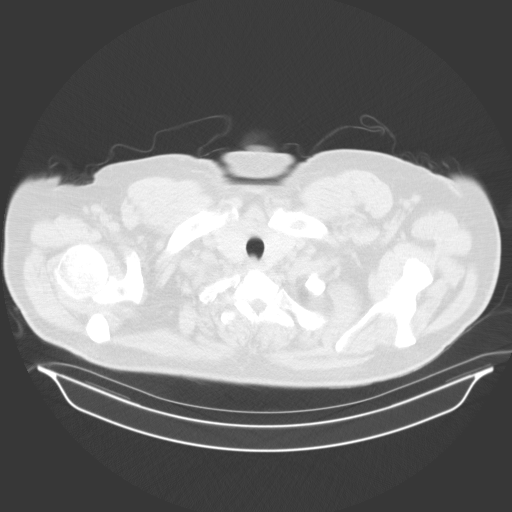

[15 of 33 positions shown; findings below may reference images not displayed]

FINDINGS: Scan is motion degraded, limiting assessment.

Cardiovascular: Mild cardiomegaly. No significant pericardial
effusion/thickening. Three-vessel coronary atherosclerosis status
post CABG. Atherosclerotic nonaneurysmal thoracic aorta. Dilated
main pulmonary artery (3.6 cm diameter), stable.

Mediastinum/Nodes: Subcentimeter coarse calcified inferior left
thyroid nodule. Unremarkable esophagus. No pathologically enlarged
axillary, mediastinal or hilar lymph nodes, noting limited
sensitivity for the detection of hilar adenopathy on this
noncontrast study.

Lungs/Pleura: No pneumothorax. No pleural effusion. No acute
consolidative airspace disease or lung masses. No significant
pulmonary nodules.

Upper abdomen: No acute abnormality.

Musculoskeletal: No aggressive appearing focal osseous lesions.
Intact sternotomy wires. Moderate thoracic spondylosis.
IMPRESSION: 1. Lung-RADS 1, negative. Continue annual screening with low-dose
chest CT without contrast in 12 months.
2. Stable dilated main pulmonary artery, suggesting chronic
pulmonary arterial hypertension.
3. Mild cardiomegaly.

Aortic Atherosclerosis ([id number]).

## 2019-07-22 ENCOUNTER — Ambulatory Visit: Payer: Medicaid Other

## 2019-10-13 ENCOUNTER — Other Ambulatory Visit: Payer: Self-pay

## 2019-10-13 ENCOUNTER — Encounter (HOSPITAL_COMMUNITY): Payer: Self-pay | Admitting: Emergency Medicine

## 2019-10-13 ENCOUNTER — Emergency Department (HOSPITAL_COMMUNITY): Payer: Medicaid Other

## 2019-10-13 ENCOUNTER — Emergency Department (HOSPITAL_COMMUNITY)
Admission: EM | Admit: 2019-10-13 | Discharge: 2019-10-13 | Disposition: A | Discharge status: Home / Self Care | Payer: Medicaid Other | Attending: Emergency Medicine | Admitting: Emergency Medicine

## 2019-10-13 DIAGNOSIS — I130 Hypertensive heart and chronic kidney disease with heart failure and stage 1 through stage 4 chronic kidney disease, or unspecified chronic kidney disease: Secondary | ICD-10-CM | POA: Insufficient documentation

## 2019-10-13 DIAGNOSIS — F17210 Nicotine dependence, cigarettes, uncomplicated: Secondary | ICD-10-CM | POA: Diagnosis not present

## 2019-10-13 DIAGNOSIS — Y92008 Other place in unspecified non-institutional (private) residence as the place of occurrence of the external cause: Secondary | ICD-10-CM | POA: Diagnosis not present

## 2019-10-13 DIAGNOSIS — E860 Dehydration: Secondary | ICD-10-CM | POA: Diagnosis not present

## 2019-10-13 DIAGNOSIS — Z794 Long term (current) use of insulin: Secondary | ICD-10-CM | POA: Diagnosis not present

## 2019-10-13 DIAGNOSIS — R55 Syncope and collapse: Secondary | ICD-10-CM | POA: Diagnosis present

## 2019-10-13 DIAGNOSIS — Y9389 Activity, other specified: Secondary | ICD-10-CM | POA: Insufficient documentation

## 2019-10-13 DIAGNOSIS — Z7982 Long term (current) use of aspirin: Secondary | ICD-10-CM | POA: Diagnosis not present

## 2019-10-13 DIAGNOSIS — Y999 Unspecified external cause status: Secondary | ICD-10-CM | POA: Diagnosis not present

## 2019-10-13 DIAGNOSIS — E119 Type 2 diabetes mellitus without complications: Secondary | ICD-10-CM | POA: Insufficient documentation

## 2019-10-13 DIAGNOSIS — I2510 Atherosclerotic heart disease of native coronary artery without angina pectoris: Secondary | ICD-10-CM | POA: Diagnosis not present

## 2019-10-13 DIAGNOSIS — I5032 Chronic diastolic (congestive) heart failure: Secondary | ICD-10-CM | POA: Diagnosis not present

## 2019-10-13 DIAGNOSIS — W19XXXA Unspecified fall, initial encounter: Secondary | ICD-10-CM

## 2019-10-13 DIAGNOSIS — N182 Chronic kidney disease, stage 2 (mild): Secondary | ICD-10-CM | POA: Insufficient documentation

## 2019-10-13 DIAGNOSIS — W010XXA Fall on same level from slipping, tripping and stumbling without subsequent striking against object, initial encounter: Secondary | ICD-10-CM | POA: Diagnosis not present

## 2019-10-13 DIAGNOSIS — Z79899 Other long term (current) drug therapy: Secondary | ICD-10-CM | POA: Diagnosis not present

## 2019-10-13 LAB — COMPREHENSIVE METABOLIC PANEL
ALT: 24 U/L (ref 0–44)
AST: 26 U/L (ref 15–41)
Albumin: 3.8 g/dL (ref 3.5–5.0)
Alkaline Phosphatase: 95 U/L (ref 38–126)
Anion gap: 12 (ref 5–15)
BUN: 35 mg/dL — ABNORMAL HIGH (ref 8–23)
CO2: 27 mmol/L (ref 22–32)
Calcium: 9.5 mg/dL (ref 8.9–10.3)
Chloride: 100 mmol/L (ref 98–111)
Creatinine, Ser: 1.64 mg/dL — ABNORMAL HIGH (ref 0.61–1.24)
GFR calc Af Amer: 52 mL/min — ABNORMAL LOW (ref >60)
GFR calc non Af Amer: 44 mL/min — ABNORMAL LOW (ref >60)
Glucose, Bld: 139 mg/dL — ABNORMAL HIGH (ref 70–99)
Potassium: 4.9 mmol/L (ref 3.5–5.1)
Sodium: 139 mmol/L (ref 135–145)
Total Bilirubin: 0.7 mg/dL (ref 0.3–1.2)
Total Protein: 8.1 g/dL (ref 6.5–8.1)

## 2019-10-13 LAB — URINALYSIS, ROUTINE W REFLEX MICROSCOPIC
Bilirubin Urine: NEGATIVE
Glucose, UA: >=500 mg/dL — AB
Ketones, ur: NEGATIVE mg/dL
Leukocytes,Ua: NEGATIVE
Nitrite: NEGATIVE
Protein, ur: >=300 mg/dL — AB
Specific Gravity, Urine: 1.015 (ref 1.005–1.030)
pH: 5 (ref 5.0–8.0)

## 2019-10-13 LAB — CBC WITH DIFFERENTIAL/PLATELET
Abs Immature Granulocytes: 0.01 10*3/uL (ref 0.00–0.07)
Basophils Absolute: 0 10*3/uL (ref 0.0–0.1)
Basophils Relative: 0 %
Eosinophils Absolute: 0.1 10*3/uL (ref 0.0–0.5)
Eosinophils Relative: 2 %
HCT: 44.9 % (ref 39.0–52.0)
Hemoglobin: 14.2 g/dL (ref 13.0–17.0)
Immature Granulocytes: 0 %
Lymphocytes Relative: 45 %
Lymphs Abs: 2 10*3/uL (ref 0.7–4.0)
MCH: 30 pg (ref 26.0–34.0)
MCHC: 31.6 g/dL (ref 30.0–36.0)
MCV: 94.7 fL (ref 80.0–100.0)
Monocytes Absolute: 0.6 10*3/uL (ref 0.1–1.0)
Monocytes Relative: 12 %
Neutro Abs: 1.9 10*3/uL (ref 1.7–7.7)
Neutrophils Relative %: 41 %
Platelets: 180 10*3/uL (ref 150–400)
RBC: 4.74 MIL/uL (ref 4.22–5.81)
RDW: 14 % (ref 11.5–15.5)
WBC: 4.6 10*3/uL (ref 4.0–10.5)
nRBC: 0 % (ref 0.0–0.2)

## 2019-10-13 MED ORDER — DICLOFENAC SODIUM 1 % EX GEL: 2.0000 g | Freq: Four times a day (QID) | CUTANEOUS | 0 refills | Status: DC

## 2019-10-13 MED ORDER — ACETAMINOPHEN 500 MG PO TABS: 500.0000 mg | ORAL_TABLET | Freq: Four times a day (QID) | ORAL | 0 refills | Status: AC | PRN

## 2019-10-13 MED ORDER — SODIUM CHLORIDE 0.9 % IV BOLUS
500.0000 mL | Freq: Once | INTRAVENOUS | Status: AC
  Administered 2019-10-13: 500 mL via INTRAVENOUS

## 2019-10-13 MED ORDER — ACETAMINOPHEN 500 MG PO TABS
1000.0000 mg | ORAL_TABLET | Freq: Once | ORAL | Status: AC
  Administered 2019-10-13: 1000 mg via ORAL
  Filled 2019-10-13: qty 2

## 2019-10-13 NOTE — Discharge Instructions (Addendum)
Take Tylenol as prescribed, as needed for your pain.  You can apply Voltaren gel to the affected areas 4 times daily.  Do not take ibuprofen, Aleve, or other NSAID medication until your kidney function is rechecked by your doctor, as it was decreased today.  Try to cut back on alcohol and focus on good hydration with water.  Please return to the emergency department if you develop any new or worsening symptoms.

## 2019-10-13 NOTE — ED Notes (Signed)
Pt was informed that we need a urine sample. Urinal placed at bedside. 

## 2019-10-13 NOTE — ED Notes (Signed)
ED Provider at bedside. 

## 2019-10-13 NOTE — ED Provider Notes (Addendum)
[facility] EMERGENCY DEPARTMENT Provider Note   CSN: [id number] Arrival date & time: 10/13/19  4098     History   Chief Complaint Chief Complaint  Patient presents with  . Loss of Consciousness    HPI [name] is a 63 y.o. male with history of hypertension, CAD, CHF, diabetes, CVA with left-sided deficit who presents following fall that occurred yesterday.  Patient reports he was drinking alcohol yesterday and had gone to bed when he went to answer the door when his mother was knocking on the door.  He felt dizzy in the hallway and fell.  He lost consciousness, however he is not sure if he lost consciousness before or after he hit the ground.  He is anticoagulated.  He has had neck pain, left shoulder pain, and right rib pain since the fall.  He denies any chest pain, new shortness of breath (some at baseline), abdominal pain, nausea, vomiting, new numbness or tingling.  Patient notes he sees a wound care doctor for a ulcer to his left heel.  He has a wound care nurse that comes to check in each week and he changes the dressing daily.     HPI  Past Medical History:  Diagnosis Date  . Coronary artery disease   . Diabetes mellitus   . Diastolic heart failure   . Hyperlipidemia   . Hypertension   . Obesity   . OSA on CPAP     Patient Active Problem List   Diagnosis Date Noted  . Peripheral vascular disease (HCC)   . Eschar of heel   . Diabetic foot infection (HCC) 04/11/2019  . Acute renal failure superimposed on stage 2 chronic kidney disease (HCC) 04/11/2019  . Nasal turbinate hypertrophy 08/04/2017  . Chronic cough 06/22/2017  . Laryngopharyngeal reflux (LPR) 06/22/2017  . TIA (transient ischemic attack) 07/13/2011  . Cerebrovascular disease 07/13/2011  . Tobacco abuse 07/12/2011  . Hyperkalemia 07/12/2011  . ATHEROSCLEROTIC CARDIOVASCULAR DISEASE 08/21/2009  . Diabetic infection of left foot (HCC) 05/12/2009  . Mixed hyperlipidemia 05/12/2009  .  OVERWEIGHT/OBESITY 05/12/2009  . OBSTRUCTIVE SLEEP APNEA 05/12/2009  . HYPERTENSION, UNSPECIFIED 05/12/2009  . DIASTOLIC HEART FAILURE, CHRONIC 05/12/2009    Past Surgical History:  Procedure Laterality Date  . CARDIAC CATHETERIZATION    . NO PAST SURGERIES          Home Medications    Prior to Admission medications   Medication Sig Start Date End Date Taking? Authorizing Provider  amLODipine (NORVASC) 5 MG tablet Take 1 tablet (5 mg total) by mouth daily. 04/12/19  Yes TatOnalee Hua, MD  aspirin 81 MG chewable tablet Chew 81 mg by mouth daily.   Yes [provider]  atorvastatin (LIPITOR) 80 MG tablet Take 1 tablet (80 mg total) by mouth daily. 02/06/19  Yes Branch, Dorothe Pea, MD  canagliflozin (INVOKANA) 300 MG TABS tablet Take 300 mg by mouth daily before breakfast.   Yes [provider]  Dulaglutide (TRULICITY) 1.5 MG/0.5ML SOPN Inject 1.5 mg into the skin once a week. On fridays   Yes [provider]  fluticasone (FLONASE) 50 MCG/ACT nasal spray 2 sprays by Each Nare route daily. 08/04/17  Yes [provider]  gabapentin (NEURONTIN) 400 MG capsule Take 400 mg by mouth 3 (three) times daily.   Yes [provider]  Insulin Glargine (LANTUS SOLOSTAR) 100 UNIT/ML Solostar Pen Inject 10 Units into the skin 2 (two) times daily.    Yes [provider]  metoprolol succinate (  TOPROL-XL) 50 MG 24 hr tablet TAKE 1 AND 1/2 TABLETS BY MOUTH ONCE DAILY. 11/02/18  Yes Antoine Poche, MD  tiotropium (SPIRIVA) 18 MCG inhalation capsule Place 1 capsule (18 mcg total) into inhaler and inhale daily. 01/05/18  Yes Mannam, Praveen, MD  acetaminophen (TYLENOL) 500 MG tablet Take 1 tablet (500 mg total) by mouth every 6 (six) hours as needed. 10/13/19   Zamiyah Resendes, Waylan Boga, PA-C  diclofenac Sodium (VOLTAREN) 1 % GEL Apply 2 g topically 4 (four) times daily. 10/13/19   Charlestine Rookstool, Waylan Boga, PA-C  dipyridamole-aspirin (AGGRENOX) 200-25 MG per 12 hr capsule Take 1  capsule by mouth 2 (two) times daily.    [provider]  traMADol (ULTRAM) 50 MG tablet Take 1 tablet (50 mg total) by mouth every 6 (six) hours as needed for moderate pain. Patient not taking: Reported on 10/13/2019 04/12/19   Catarina Hartshorn, MD    Family History Family History  Problem Relation Age of Onset  . Diabetes Father   . Hypertension Father     Social History Social History   Tobacco Use  . Smoking status: Current Every Day Smoker    Packs/day: 1.00    Years: 15.00    Pack years: 15.00    Types: Cigarettes  . Smokeless tobacco: Never Used  . Tobacco comment: Down to a cigarette per week  Substance Use Topics  . Alcohol use: Yes    Alcohol/week: 4.0 standard drinks    Types: 2 Cans of beer, 2 Shots of liquor per week    Comment: occ  . Drug use: No     Allergies   Patient has no known allergies.   Review of Systems Review of Systems  Constitutional: Negative for chills and fever.  HENT: Negative for facial swelling and sore throat.   Respiratory: Negative for shortness of breath.   Cardiovascular: Positive for chest pain (R rib with coughing).  Gastrointestinal: Negative for abdominal pain, nausea and vomiting.  Genitourinary: Negative for dysuria.  Musculoskeletal: Positive for arthralgias, back pain and neck pain.  Skin: Negative for rash and wound.  Neurological: Positive for numbness (baseline from previous CVA). Negative for headaches.  Psychiatric/Behavioral: The patient is not nervous/anxious.      Physical Exam Updated Vital Signs BP (!) 169/97 (BP Location: Left Arm)   Pulse 68   Temp 98.3 F (36.8 C) (Oral)   Resp (!) 23   Ht 6\' 1"  (1.854 m)   Wt (!) 140.6 kg   SpO2 100%   BMI 40.90 kg/m   Physical Exam Vitals signs and nursing note reviewed.  Constitutional:      General: He is not in acute distress.    Appearance: He is well-developed. He is not diaphoretic.  HENT:     Head: Normocephalic and atraumatic.     Mouth/Throat:      Pharynx: No oropharyngeal exudate.  Eyes:     General: No scleral icterus.       Right eye: No discharge.        Left eye: No discharge.     Extraocular Movements: Extraocular movements intact.     Conjunctiva/sclera: Conjunctivae normal.     Pupils: Pupils are equal, round, and reactive to light.  Neck:     Musculoskeletal: Normal range of motion and neck supple.     Thyroid: No thyromegaly.  Cardiovascular:     Rate and Rhythm: Normal rate and regular rhythm.     Heart sounds: Normal heart sounds. No murmur.  No friction rub. No gallop.   Pulmonary:     Effort: Pulmonary effort is normal. No respiratory distress.     Breath sounds: Normal breath sounds. No stridor. No wheezing or rales.     Comments: Mildly tachypneic, patient states baseline Chest:     Chest wall: Tenderness (mild) present.    Abdominal:     General: Bowel sounds are normal. There is no distension.     Palpations: Abdomen is soft.     Tenderness: There is no abdominal tenderness. There is no guarding or rebound.  Musculoskeletal:     Comments: Midline cervical tenderness, no midline thoracic or lumbar tenderness Tenderness to the left shoulder, anterior and laterally No tenderness about the lower extremities or hips  Lymphadenopathy:     Cervical: No cervical adenopathy.  Skin:    General: Skin is warm and dry.     Coloration: Skin is not pale.     Findings: No rash.     Comments: Very well-appearing stage II left heel ulcer, no erythema or drainage  Neurological:     Mental Status: He is alert.     Coordination: Coordination normal.     Comments: CN 3-12 intact; normal sensation throughout; 5/5 strength in all 4 extremities; equal bilateral grip strength      ED Treatments / Results  Labs (all labs ordered are listed, but only abnormal results are displayed) Labs Reviewed  COMPREHENSIVE METABOLIC PANEL - Abnormal; Notable for the following components:      Result Value   Glucose, Bld 139 (*)     BUN 35 (*)    Creatinine, Ser 1.64 (*)    GFR calc non Af Amer 44 (*)    GFR calc Af Amer 52 (*)    All other components within normal limits  URINALYSIS, ROUTINE W REFLEX MICROSCOPIC - Abnormal; Notable for the following components:   Glucose, UA >=500 (*)    Hgb urine dipstick SMALL (*)    Protein, ur >=300 (*)    Bacteria, UA FEW (*)    All other components within normal limits  CBC WITH DIFFERENTIAL/PLATELET    EKG None  Radiology Dg Ribs Unilateral W/chest Right  Result Date: 10/13/2019 CLINICAL DATA:  Pt c/o generalized pain in Rt ribs and Lt shoulder since "blacking out and falling in a hallway on Friday" Hx HTN, diabetes, CAD, smoker EXAM: RIGHT RIBS AND CHEST - 3+ VIEW COMPARISON:  CT 05/21/2019 FINDINGS: No fracture or other bone lesions are seen involving the ribs. There is no evidence of pneumothorax or pleural effusion. Both lungs are clear. Heart size and mediastinal contours are within normal limits. Stable old fracture of deformity of the right sixth rib laterally. CABG markers and sternotomy wires. Elevated left diaphragmatic leaflet with some linear subsegmental atelectasis or scarring at the left lung base. Anterior vertebral endplate spurring at multiple levels in the lower thoracic spine. IMPRESSION: 1. No acute abnormality. No evidence of pneumothorax or pleural effusion. 2. Stable for old right sixth rib fracture deformity. Electronically Signed   By: Lucrezia Europe M.D.   On: 10/13/2019 11:26   Ct Head Wo Contrast  Result Date: 10/13/2019 CLINICAL DATA:  Syncope, blunt trauma EXAM: CT HEAD WITHOUT CONTRAST TECHNIQUE: Contiguous axial images were obtained from the base of the skull through the vertex without intravenous contrast. COMPARISON:  07/12/2011 FINDINGS: Brain: Chronic right PCA distribution encephalomalacia. No acute intracranial hemorrhage, midline shift, mass or mass effect. Mild regions of hypoattenuation in deep and periventricular  white matter  bilaterally. No evidence of acute infarct. Vascular: Atherosclerotic and physiologic intracranial calcifications. Skull: Normal. Negative for fracture or focal lesion. Sinuses/Orbits: Fixation hardware in the left superior orbital rim as before visualized paranasal sinuses and mastoid air cells appear normally aerated. Other: None IMPRESSION: 1. No acute intracranial abnormality. 2. Chronic right PCA distribution encephalomalacia. 3. Mild chronic microvascular ischemic changes of the white matter. Electronically Signed   By: Corlis Leak  Hassell M.D.   On: 10/13/2019 11:29   Ct Cervical Spine Wo Contrast  Result Date: 10/13/2019 CLINICAL DATA:  Syncope, blunt trauma, neck pain EXAM: CT CERVICAL SPINE WITHOUT CONTRAST TECHNIQUE: Multidetector CT imaging of the cervical spine was performed without intravenous contrast. Multiplanar CT image reconstructions were also generated. COMPARISON:  None. FINDINGS: Alignment: Mild reversal of the normal cervical lordosis. No spondylolisthesis. No distraction nor dislocation. Skull base and vertebrae: Negative for fracture. No focal bone lesion. Soft tissues and spinal canal: No prevertebral fluid or swelling. No visible canal hematoma. Disc levels: Mild narrowing of the C3-4 interspace with central protrusion. The remainder of interspaces unremarkable. Upper chest: 1 cm peripherally calcified left thyroid lesion; no followup recommended (ref: J Am Coll Radiol. 2015 Feb;12(2): 143-50). Thyromegaly, incompletely visualized. Other: Bilateral carotid arterial calcifications. IMPRESSION: 1. No acute fracture or subluxation of the cervical spine. 2. Mild reversal of the normal cervical lordosis may be due to positioning or muscle spasm. 3. Bilateral carotid arterial calcifications. Electronically Signed   By: Corlis Leak  Hassell M.D.   On: 10/13/2019 11:34   Dg Shoulder Left  Result Date: 10/13/2019 CLINICAL DATA:  Pain post fall EXAM: LEFT SHOULDER - 2+ VIEW COMPARISON:  CT 05/21/2019, and  previous FINDINGS: There is no evidence of fracture or dislocation. There is no evidence of arthropathy or other acute bone abnormality. Soft tissues are unremarkable. Old healed fracture of the left fifth rib. Sternotomy wires. IMPRESSION: 1. No acute abnormality. 2. Old healed fracture of the left fifth rib. Electronically Signed   By: Corlis Leak  Hassell M.D.   On: 10/13/2019 11:21    Procedures Procedures (including critical care time)  Medications Ordered in ED Medications  sodium chloride 0.9 % bolus 500 mL (0 mLs Intravenous Stopped 10/13/19 1230)  acetaminophen (TYLENOL) tablet 1,000 mg (1,000 mg Oral Given 10/13/19 1229)     Initial Impression / Assessment and Plan / ED Course  I have reviewed the triage vital signs and the nursing notes.  Pertinent labs & imaging results that were available during my care of the patient were reviewed by me and considered in my medical decision making (see chart for details).        Patient presenting following fall yesterday after drinking a large amount of alcohol.  Patient was walking and got dizzy and off balance and fell.  CT head and C-spine are negative.  X-rays of the left shoulder and right ribs are negative for acute fracture.  Labs do show some dehydration and elevated BUN/creatinine from baseline.  Small fluid bolus given in the ED considering patient's CHF history.  Encouraged cutting back on alcohol and increasing fluid intake.  Suspect patient fell due to the large amount of alcohol-2 half pints of liquor, 1 bottle of wine, and a 24 ounce beer.  Will discharge home with Tylenol and Voltaren.  Patient advised not to take over-the-counter NSAID medication until his kidney function is rechecked.  Advised follow-up with PCP in the next 7 to 10 days.  Return precautions discussed.  Patient understands and agrees with  plan.  Patient vitals stable throughout ED course and discharged in satisfactory condition.  Final Clinical Impressions(s) / ED  Diagnoses   Final diagnoses:  Fall, initial encounter  Dehydration    ED Discharge Orders         Ordered    acetaminophen (TYLENOL) 500 MG tablet  Every 6 hours PRN     10/13/19 1249    diclofenac Sodium (VOLTAREN) 1 % GEL  4 times daily     10/13/19 [address], PA-C 10/13/19 1320    Bethann Berkshire, MD 10/18/19 [phone]

## 2019-10-13 NOTE — ED Triage Notes (Addendum)
Patient states he was walking in hallway at house to answer door when he started to feel a little dizzy and then woke up on the floor to family members knocking harder on the door Friday. Per patient family had to help him off the floor. Patient unsure of how long he had LOC, unsure of hitting head. Patient denies anymore dizziness or syncopal episodes since. Denies any nausea, vomiting, or blurred vision. Patient c/o left shoulder pain and cough since. Patient reports using ibuprofen and heat pack to shoulder with no relief. Per patient difficulty with turning neck due to pain. Patient has hx of open heart surgery and stroke (left side numbness). Patient takes anticoagulant but is unsure of the name. Patient does report he was drinking alcohol that day (drunk 2 half pints of liquor, bottle wine, and a 24oz of beer)

## 2019-11-07 ENCOUNTER — Other Ambulatory Visit: Payer: Self-pay | Admitting: Cardiology

## 2020-01-03 ENCOUNTER — Other Ambulatory Visit: Payer: Self-pay | Admitting: Cardiology

## 2020-01-22 ENCOUNTER — Emergency Department (HOSPITAL_COMMUNITY): Payer: Medicaid Other

## 2020-01-22 ENCOUNTER — Inpatient Hospital Stay (HOSPITAL_COMMUNITY)
Admission: EM | Admit: 2020-01-22 | Discharge: 2020-01-24 | DRG: 872 | Disposition: A | Discharge status: Home / Self Care | Payer: Medicaid Other | Attending: Family Medicine | Admitting: Family Medicine

## 2020-01-22 ENCOUNTER — Other Ambulatory Visit: Payer: Self-pay

## 2020-01-22 ENCOUNTER — Encounter (HOSPITAL_COMMUNITY): Payer: Self-pay | Admitting: Internal Medicine

## 2020-01-22 DIAGNOSIS — E1142 Type 2 diabetes mellitus with diabetic polyneuropathy: Secondary | ICD-10-CM | POA: Diagnosis present

## 2020-01-22 DIAGNOSIS — E785 Hyperlipidemia, unspecified: Secondary | ICD-10-CM | POA: Diagnosis present

## 2020-01-22 DIAGNOSIS — E119 Type 2 diabetes mellitus without complications: Secondary | ICD-10-CM

## 2020-01-22 DIAGNOSIS — Z6841 Body Mass Index (BMI) 40.0 and over, adult: Secondary | ICD-10-CM | POA: Diagnosis not present

## 2020-01-22 DIAGNOSIS — N179 Acute kidney failure, unspecified: Secondary | ICD-10-CM | POA: Diagnosis present

## 2020-01-22 DIAGNOSIS — E669 Obesity, unspecified: Secondary | ICD-10-CM | POA: Diagnosis present

## 2020-01-22 DIAGNOSIS — I2510 Atherosclerotic heart disease of native coronary artery without angina pectoris: Secondary | ICD-10-CM | POA: Diagnosis present

## 2020-01-22 DIAGNOSIS — Z8249 Family history of ischemic heart disease and other diseases of the circulatory system: Secondary | ICD-10-CM | POA: Diagnosis not present

## 2020-01-22 DIAGNOSIS — Z8673 Personal history of transient ischemic attack (TIA), and cerebral infarction without residual deficits: Secondary | ICD-10-CM | POA: Diagnosis not present

## 2020-01-22 DIAGNOSIS — I5032 Chronic diastolic (congestive) heart failure: Secondary | ICD-10-CM | POA: Diagnosis present

## 2020-01-22 DIAGNOSIS — A419 Sepsis, unspecified organism: Secondary | ICD-10-CM | POA: Diagnosis present

## 2020-01-22 DIAGNOSIS — I130 Hypertensive heart and chronic kidney disease with heart failure and stage 1 through stage 4 chronic kidney disease, or unspecified chronic kidney disease: Secondary | ICD-10-CM | POA: Diagnosis present

## 2020-01-22 DIAGNOSIS — R6520 Severe sepsis without septic shock: Secondary | ICD-10-CM | POA: Diagnosis present

## 2020-01-22 DIAGNOSIS — I959 Hypotension, unspecified: Secondary | ICD-10-CM | POA: Diagnosis not present

## 2020-01-22 DIAGNOSIS — Z794 Long term (current) use of insulin: Secondary | ICD-10-CM

## 2020-01-22 DIAGNOSIS — F17210 Nicotine dependence, cigarettes, uncomplicated: Secondary | ICD-10-CM | POA: Diagnosis present

## 2020-01-22 DIAGNOSIS — F1010 Alcohol abuse, uncomplicated: Secondary | ICD-10-CM | POA: Diagnosis present

## 2020-01-22 DIAGNOSIS — G4733 Obstructive sleep apnea (adult) (pediatric): Secondary | ICD-10-CM | POA: Diagnosis present

## 2020-01-22 DIAGNOSIS — E1122 Type 2 diabetes mellitus with diabetic chronic kidney disease: Secondary | ICD-10-CM

## 2020-01-22 DIAGNOSIS — I10 Essential (primary) hypertension: Secondary | ICD-10-CM | POA: Diagnosis present

## 2020-01-22 DIAGNOSIS — Z79899 Other long term (current) drug therapy: Secondary | ICD-10-CM | POA: Diagnosis not present

## 2020-01-22 DIAGNOSIS — Z833 Family history of diabetes mellitus: Secondary | ICD-10-CM | POA: Diagnosis not present

## 2020-01-22 DIAGNOSIS — A4151 Sepsis due to Escherichia coli [E. coli]: Secondary | ICD-10-CM | POA: Diagnosis not present

## 2020-01-22 DIAGNOSIS — Z7982 Long term (current) use of aspirin: Secondary | ICD-10-CM

## 2020-01-22 DIAGNOSIS — Z20822 Contact with and (suspected) exposure to covid-19: Secondary | ICD-10-CM | POA: Diagnosis present

## 2020-01-22 DIAGNOSIS — Z23 Encounter for immunization: Secondary | ICD-10-CM

## 2020-01-22 DIAGNOSIS — N182 Chronic kidney disease, stage 2 (mild): Secondary | ICD-10-CM

## 2020-01-22 DIAGNOSIS — N390 Urinary tract infection, site not specified: Secondary | ICD-10-CM

## 2020-01-22 DIAGNOSIS — E1151 Type 2 diabetes mellitus with diabetic peripheral angiopathy without gangrene: Secondary | ICD-10-CM | POA: Diagnosis present

## 2020-01-22 LAB — COMPREHENSIVE METABOLIC PANEL
ALT: 14 U/L (ref 0–44)
AST: 20 U/L (ref 15–41)
Albumin: 3 g/dL — ABNORMAL LOW (ref 3.5–5.0)
Alkaline Phosphatase: 70 U/L (ref 38–126)
Anion gap: 10 (ref 5–15)
BUN: 64 mg/dL — ABNORMAL HIGH (ref 8–23)
CO2: 20 mmol/L — ABNORMAL LOW (ref 22–32)
Calcium: 8.3 mg/dL — ABNORMAL LOW (ref 8.9–10.3)
Chloride: 105 mmol/L (ref 98–111)
Creatinine, Ser: 4.35 mg/dL — ABNORMAL HIGH (ref 0.61–1.24)
GFR calc Af Amer: 16 mL/min — ABNORMAL LOW (ref >60)
GFR calc non Af Amer: 14 mL/min — ABNORMAL LOW (ref >60)
Glucose, Bld: 198 mg/dL — ABNORMAL HIGH (ref 70–99)
Potassium: 4.7 mmol/L (ref 3.5–5.1)
Sodium: 135 mmol/L (ref 135–145)
Total Bilirubin: 0.6 mg/dL (ref 0.3–1.2)
Total Protein: 6.8 g/dL (ref 6.5–8.1)

## 2020-01-22 LAB — URINALYSIS, ROUTINE W REFLEX MICROSCOPIC
Bilirubin Urine: NEGATIVE
Glucose, UA: >=500 mg/dL — AB
Ketones, ur: NEGATIVE mg/dL
Nitrite: POSITIVE — AB
Protein, ur: 100 mg/dL — AB
RBC / HPF: >50 RBC/hpf — ABNORMAL HIGH (ref 0–5)
Specific Gravity, Urine: 1.012 (ref 1.005–1.030)
WBC, UA: >50 WBC/hpf — ABNORMAL HIGH (ref 0–5)
pH: 5 (ref 5.0–8.0)

## 2020-01-22 LAB — GLUCOSE, CAPILLARY: Glucose-Capillary: 125 mg/dL — ABNORMAL HIGH (ref 70–99)

## 2020-01-22 LAB — CBC WITH DIFFERENTIAL/PLATELET
Abs Immature Granulocytes: 0.05 10*3/uL (ref 0.00–0.07)
Basophils Absolute: 0 10*3/uL (ref 0.0–0.1)
Basophils Relative: 0 %
Eosinophils Absolute: 0 10*3/uL (ref 0.0–0.5)
Eosinophils Relative: 0 %
HCT: 37.3 % — ABNORMAL LOW (ref 39.0–52.0)
Hemoglobin: 11.8 g/dL — ABNORMAL LOW (ref 13.0–17.0)
Immature Granulocytes: 1 %
Lymphocytes Relative: 16 %
Lymphs Abs: 1.4 10*3/uL (ref 0.7–4.0)
MCH: 29.8 pg (ref 26.0–34.0)
MCHC: 31.6 g/dL (ref 30.0–36.0)
MCV: 94.2 fL (ref 80.0–100.0)
Monocytes Absolute: 1.1 10*3/uL — ABNORMAL HIGH (ref 0.1–1.0)
Monocytes Relative: 13 %
Neutro Abs: 6.1 10*3/uL (ref 1.7–7.7)
Neutrophils Relative %: 70 %
Platelets: 149 10*3/uL — ABNORMAL LOW (ref 150–400)
RBC: 3.96 MIL/uL — ABNORMAL LOW (ref 4.22–5.81)
RDW: 14.9 % (ref 11.5–15.5)
WBC: 8.6 10*3/uL (ref 4.0–10.5)
nRBC: 0 % (ref 0.0–0.2)

## 2020-01-22 LAB — PROTIME-INR
INR: 1.2 (ref 0.8–1.2)
Prothrombin Time: 15.5 seconds — ABNORMAL HIGH (ref 11.4–15.2)

## 2020-01-22 LAB — LACTIC ACID, PLASMA: Lactic Acid, Venous: 1.6 mmol/L (ref 0.5–1.9)

## 2020-01-22 LAB — TROPONIN I (HIGH SENSITIVITY): Troponin I (High Sensitivity): 26 ng/L — ABNORMAL HIGH (ref <18)

## 2020-01-22 LAB — APTT: aPTT: 35 seconds (ref 24–36)

## 2020-01-22 LAB — BRAIN NATRIURETIC PEPTIDE: B Natriuretic Peptide: 150 pg/mL — ABNORMAL HIGH (ref 0.0–100.0)

## 2020-01-22 MED ORDER — HEPARIN SODIUM (PORCINE) 5000 UNIT/ML IJ SOLN
5000.0000 [IU] | Freq: Three times a day (TID) | INTRAMUSCULAR | Status: DC
  Administered 2020-01-22 – 2020-01-24 (×5): 5000 [IU] via SUBCUTANEOUS
  Filled 2020-01-22 (×5): qty 1

## 2020-01-22 MED ORDER — ATORVASTATIN CALCIUM 40 MG PO TABS
80.0000 mg | ORAL_TABLET | Freq: Every day | ORAL | Status: DC
  Administered 2020-01-23 – 2020-01-24 (×2): 80 mg via ORAL
  Filled 2020-01-22 (×2): qty 2

## 2020-01-22 MED ORDER — NALOXONE HCL 0.4 MG/ML IJ SOLN
0.4000 mg | Freq: Once | INTRAMUSCULAR | Status: AC
  Administered 2020-01-22: 0.4 mg via INTRAVENOUS
  Filled 2020-01-22: qty 1

## 2020-01-22 MED ORDER — LACTATED RINGERS IV BOLUS (SEPSIS)
1000.0000 mL | Freq: Once | INTRAVENOUS | Status: AC
  Administered 2020-01-22: 1000 mL via INTRAVENOUS

## 2020-01-22 MED ORDER — SODIUM CHLORIDE 0.9 % IV SOLN
2.0000 g | INTRAVENOUS | Status: DC
  Administered 2020-01-23 – 2020-01-24 (×2): 2 g via INTRAVENOUS
  Filled 2020-01-22 (×2): qty 20

## 2020-01-22 MED ORDER — SODIUM CHLORIDE 0.9 % IV SOLN
2.0000 g | Freq: Once | INTRAVENOUS | Status: AC
  Administered 2020-01-22: 18:00:00 2 g via INTRAVENOUS
  Filled 2020-01-22: qty 20

## 2020-01-22 MED ORDER — ASPIRIN 81 MG PO CHEW
81.0000 mg | CHEWABLE_TABLET | Freq: Every day | ORAL | Status: DC
  Administered 2020-01-23 – 2020-01-24 (×2): 81 mg via ORAL
  Filled 2020-01-22 (×2): qty 1

## 2020-01-22 MED ORDER — INSULIN GLARGINE 100 UNIT/ML ~~LOC~~ SOLN
5.0000 [IU] | Freq: Two times a day (BID) | SUBCUTANEOUS | Status: DC
  Administered 2020-01-22 – 2020-01-24 (×4): 5 [IU] via SUBCUTANEOUS
  Filled 2020-01-22 (×11): qty 0.05

## 2020-01-22 MED ORDER — INSULIN ASPART 100 UNIT/ML ~~LOC~~ SOLN
0.0000 [IU] | Freq: Three times a day (TID) | SUBCUTANEOUS | Status: DC
  Administered 2020-01-23 (×3): 2 [IU] via SUBCUTANEOUS
  Administered 2020-01-24: 3 [IU] via SUBCUTANEOUS
  Administered 2020-01-24: 2 [IU] via SUBCUTANEOUS

## 2020-01-22 MED ORDER — INFLUENZA VAC SPLIT QUAD 0.5 ML IM SUSY
0.5000 mL | PREFILLED_SYRINGE | INTRAMUSCULAR | Status: AC
  Administered 2020-01-24: 0.5 mL via INTRAMUSCULAR
  Filled 2020-01-22: qty 0.5

## 2020-01-22 MED ORDER — ASPIRIN-DIPYRIDAMOLE ER 25-200 MG PO CP12
1.0000 | ORAL_CAPSULE | Freq: Two times a day (BID) | ORAL | Status: DC
  Administered 2020-01-22 – 2020-01-24 (×4): 1 via ORAL
  Filled 2020-01-22 (×4): qty 1

## 2020-01-22 MED ORDER — SODIUM CHLORIDE 0.9 % IV SOLN: INTRAVENOUS | Status: DC

## 2020-01-22 MED ORDER — GABAPENTIN 400 MG PO CAPS
400.0000 mg | ORAL_CAPSULE | Freq: Three times a day (TID) | ORAL | Status: DC
  Administered 2020-01-22 – 2020-01-24 (×5): 400 mg via ORAL
  Filled 2020-01-22 (×5): qty 1

## 2020-01-22 MED ORDER — INSULIN GLARGINE 100 UNIT/ML ~~LOC~~ SOLN: 7.0000 [IU] | Freq: Two times a day (BID) | SUBCUTANEOUS | Status: DC

## 2020-01-22 MED ORDER — LACTATED RINGERS IV BOLUS (SEPSIS)
500.0000 mL | Freq: Once | INTRAVENOUS | Status: AC
  Administered 2020-01-22: 20:00:00 500 mL via INTRAVENOUS

## 2020-01-22 MED ORDER — PNEUMOCOCCAL VAC POLYVALENT 25 MCG/0.5ML IJ INJ
0.5000 mL | INJECTION | INTRAMUSCULAR | Status: AC
  Administered 2020-01-24: 10:00:00 0.5 mL via INTRAMUSCULAR
  Filled 2020-01-22: qty 0.5

## 2020-01-22 NOTE — ED Provider Notes (Addendum)
[facility] EMERGENCY DEPARTMENT Provider Note   CSN: [id number] Arrival date & time: 01/22/20  1656     History Chief Complaint  Patient presents with  . Drug Overdose    [name] is a 64 y.o. male.  HPI   This patient is an ill-appearing 64 year old male, he has a known history of congestive heart failure, he is a diabetic, he has had significant coronary disease status post bypass grafting, he is also known to have diabetic peripheral vascular disease as well as peripheral neuropathy.  He has had a transient ischemic attack, he is a smoker and has some chronic renal disease as well.  He presents to the hospital by EMS coming from home after family called.  The patient was unconscious, diaphoretic, blood pressure was 55 systolic upon their arrival, he had taken his medications this morning around 10:00 including some Neurontin and oxycodone.  His blood sugar was 214, IV was placed and the patient was eventually given Narcan by both nasal route as well as intravenously.  The patient eventually gradually woke up.  His blood pressure slightly improved and he was given 500 cc of normal saline prehospital.  The patient does not recall any of this, he does remember laying in bed the next thing he knows he is in the hospital.  He denies chest pain coughing or shortness of breath, he states he feels generally weak all over but does not recall feeling poorly prior to that.  There is no visual changes, no nausea or vomiting, no diarrhea, denies swelling of the legs out of the normal for him.  Past Medical History:  Diagnosis Date  . Coronary artery disease   . Diabetes mellitus   . Diastolic heart failure   . Hyperlipidemia   . Hypertension   . Obesity   . OSA on CPAP     Patient Active Problem List   Diagnosis Date Noted  . Sepsis associated hypotension ([place]) 01/22/2020  . HTN (hypertension) 01/22/2020  . Sepsis secondary to UTI ([place]) 01/22/2020  . Peripheral vascular disease  ([place])   . Eschar of heel   . Diabetic foot infection ([place]) 04/11/2019  . Acute renal failure superimposed on stage 2 chronic kidney disease ([place]) 04/11/2019  . Nasal turbinate hypertrophy 08/04/2017  . Chronic cough 06/22/2017  . Laryngopharyngeal reflux (LPR) 06/22/2017  . TIA (transient ischemic attack) 07/13/2011  . Cerebrovascular disease 07/13/2011  . Tobacco abuse 07/12/2011  . Hyperkalemia 07/12/2011  . ATHEROSCLEROTIC CARDIOVASCULAR DISEASE 08/21/2009  . DM2 (diabetes mellitus, type 2) ([place]) 05/12/2009  . Mixed hyperlipidemia 05/12/2009  . OVERWEIGHT/OBESITY 05/12/2009  . OBSTRUCTIVE SLEEP APNEA 05/12/2009  . HYPERTENSION, UNSPECIFIED 05/12/2009  . DIASTOLIC HEART FAILURE, CHRONIC 05/12/2009    Past Surgical History:  Procedure Laterality Date  . CARDIAC CATHETERIZATION    . NO PAST SURGERIES         Family History  Problem Relation Age of Onset  . Diabetes Father   . Hypertension Father     Social History   Tobacco Use  . Smoking status: Current Every Day Smoker    Packs/day: 1.00    Years: 15.00    Pack years: 15.00    Types: Cigarettes  . Smokeless tobacco: Never Used  . Tobacco comment: Down to a cigarette per week  Substance Use Topics  . Alcohol use: Yes    Alcohol/week: 4.0 standard drinks    Types: 2 Cans of beer, 2 Shots of liquor per week    Comment:  occ  . Drug use: No    Home Medications Prior to Admission medications   Medication Sig Start Date End Date Taking? Authorizing Provider  acetaminophen (TYLENOL) 500 MG tablet Take 1 tablet (500 mg total) by mouth every 6 (six) hours as needed. Patient taking differently: Take 500 mg by mouth every 6 (six) hours as needed for mild pain or headache (pain in neck).  10/13/19  Yes Law, Alexandra M, PA-C  amLODipine (NORVASC) 5 MG tablet Take 1 tablet (5 mg total) by mouth daily. 04/12/19  Yes TatOnalee Hua, MD  aspirin 81 MG chewable tablet Chew 81 mg by mouth daily.   Yes [provider]    atorvastatin (LIPITOR) 80 MG tablet Take 1 tablet (80 mg total) by mouth daily. 02/06/19  Yes Branch, Dorothe Pea, MD  canagliflozin (INVOKANA) 300 MG TABS tablet Take 300 mg by mouth daily before breakfast.   Yes [provider]  diclofenac Sodium (VOLTAREN) 1 % GEL Apply 2 g topically 4 (four) times daily. Patient taking differently: Apply 2 g topically 4 (four) times daily as needed (for neck pain).  10/13/19  Yes Law, Waylan Boga, PA-C  dipyridamole-aspirin (AGGRENOX) 200-25 MG per 12 hr capsule Take 1 capsule by mouth 2 (two) times daily.   Yes [provider]  fluticasone (FLONASE) 50 MCG/ACT nasal spray 2 sprays by Each Nare route daily. 08/04/17  Yes [provider]  gabapentin (NEURONTIN) 400 MG capsule Take 400 mg by mouth 3 (three) times daily.   Yes [provider]  Insulin Glargine (LANTUS SOLOSTAR) 100 UNIT/ML Solostar Pen Inject 10 Units into the skin 2 (two) times daily.    Yes [provider]  lisinopril-hydrochlorothiazide (ZESTORETIC) 20-12.5 MG tablet TAKE 1 TABLET BY MOUTH ONCE DAILY. 01/03/20  Yes Branch, Dorothe Pea, MD  metoprolol succinate (TOPROL-XL) 50 MG 24 hr tablet TAKE 1 AND 1/2 TABLETS BY MOUTH ONCE DAILY. 11/07/19  Yes BranchDorothe Pea, MD  Dulaglutide (TRULICITY) 1.5 MG/0.5ML SOPN Inject 1.5 mg into the skin once a week. On fridays    [provider]    Allergies    Patient has no known allergies.  Review of Systems   Review of Systems  All other systems reviewed and are negative.   Physical Exam Updated Vital Signs BP (!) 88/64   Pulse (!) 58   Temp (!) 95.1 F (35.1 C) (Rectal)   Resp 15   Ht 1.753 m (5\' 9" )   Wt 126.1 kg   SpO2 100%   BMI 41.05 kg/m   Physical Exam Vitals and nursing note reviewed.  Constitutional:      General: He is not in acute distress.    Appearance: He is well-developed. He is ill-appearing.  HENT:     Head: Normocephalic and atraumatic.     Mouth/Throat:      Pharynx: No oropharyngeal exudate.  Eyes:     General: No scleral icterus.       Right eye: No discharge.        Left eye: No discharge.     Conjunctiva/sclera: Conjunctivae normal.     Pupils: Pupils are equal, round, and reactive to light.     Comments: Pupils are 1 mm and symmetrical bilaterally  Neck:     Thyroid: No thyromegaly.     Vascular: No JVD.  Cardiovascular:     Rate and Rhythm: Normal rate and regular rhythm.     Heart sounds: Normal heart sounds. No murmur. No friction rub.  No gallop.      Comments: Well-healed surgical scar of the anterior sternum Pulmonary:     Effort: Pulmonary effort is normal. No respiratory distress.     Breath sounds: Normal breath sounds. No wheezing or rales.  Abdominal:     General: Bowel sounds are normal. There is no distension.     Palpations: Abdomen is soft. There is no mass.     Tenderness: There is no abdominal tenderness.  Musculoskeletal:        General: No tenderness. Normal range of motion.     Cervical back: Normal range of motion and neck supple.     Right lower leg: Edema present.     Left lower leg: Edema present.  Lymphadenopathy:     Cervical: No cervical adenopathy.  Skin:    General: Skin is warm and dry.     Findings: No erythema or rash.  Neurological:     Mental Status: He is alert.     Coordination: Coordination normal.     Comments: The patient is mildly somnolent but awake and able to answer questions with clear speech, normal strength in all 4 extremities, pupils are constricted bilaterally, there is no facial droop.  Psychiatric:        Behavior: Behavior normal.     ED Results / Procedures / Treatments   Labs (all labs ordered are listed, but only abnormal results are displayed) Labs Reviewed  COMPREHENSIVE METABOLIC PANEL - Abnormal; Notable for the following components:      Result Value   CO2 20 (*)    Glucose, Bld 198 (*)    BUN 64 (*)    Creatinine, Ser 4.35 (*)    Calcium 8.3 (*)     Albumin 3.0 (*)    GFR calc non Af Amer 14 (*)    GFR calc Af Amer 16 (*)    All other components within normal limits  CBC WITH DIFFERENTIAL/PLATELET - Abnormal; Notable for the following components:   RBC 3.96 (*)    Hemoglobin 11.8 (*)    HCT 37.3 (*)    Platelets 149 (*)    Monocytes Absolute 1.1 (*)    All other components within normal limits  PROTIME-INR - Abnormal; Notable for the following components:   Prothrombin Time 15.5 (*)    All other components within normal limits  URINALYSIS, ROUTINE W REFLEX MICROSCOPIC - Abnormal; Notable for the following components:   Color, Urine AMBER (*)    APPearance TURBID (*)    Glucose, UA >=500 (*)    Hgb urine dipstick LARGE (*)    Protein, ur 100 (*)    Nitrite POSITIVE (*)    Leukocytes,Ua LARGE (*)    RBC / HPF >50 (*)    WBC, UA >50 (*)    Bacteria, UA FEW (*)    Non Squamous Epithelial 6-10 (*)    All other components within normal limits  BRAIN NATRIURETIC PEPTIDE - Abnormal; Notable for the following components:   B Natriuretic Peptide 150.0 (*)    All other components within normal limits  TROPONIN I (HIGH SENSITIVITY) - Abnormal; Notable for the following components:   Troponin I (High Sensitivity) 26 (*)    All other components within normal limits  CULTURE, BLOOD (ROUTINE X 2)  CULTURE, BLOOD (ROUTINE X 2)  URINE CULTURE  LACTIC ACID, PLASMA  APTT  HEMOGLOBIN A1C  CORTISOL-AM, BLOOD  PROCALCITONIN  CBC  COMPREHENSIVE METABOLIC PANEL    EKG EKG Interpretation  Date/Time:  Wednesday January 22 2020 17:16:56 EDT Ventricular Rate:  58 PR Interval:    QRS Duration: 178 QT Interval:  485 QTC Calculation: 477 R Axis:   -119 Text Interpretation: Sinus rhythm Right bundle branch block Since last tracing rate slower Confirmed by Eber Hong ([id number]) on 01/22/2020 5:26:15 PM   Radiology CT Head Wo Contrast  Result Date: 01/22/2020 CLINICAL DATA:  Mental status change EXAM: CT HEAD WITHOUT CONTRAST TECHNIQUE:  Contiguous axial images were obtained from the base of the skull through the vertex without intravenous contrast. COMPARISON:  October 13, 2019 FINDINGS: Brain: Similar distribution of chronic encephalomalacia in the right medial parietooccipital lobes compared to 2020. No acute intracranial hemorrhage, midline shift, mass effect or abnormal extra-axial fluid collection. No new loss of gray-white matter junction differentiation. Similar distribution of white matter hypoattenuation in the bilateral corona radiata. Similar distribution of small lacunar infarctions in the right lentiform nucleus and internal capsule. Chronic diminutive curvilinear pituitary gland. Vascular: At least moderate circumferential atherosclerotic narrowing of the bilateral vertebral and internal carotid arteries. No hyperdense vessel sign. Skull: Surgical pin fixation of the left frontal bone without hardware loosening or failure. Bone demineralization. Stable size and contours of a probable bone infarction in the right greater wing of sphenoid. Sinuses/Orbits: Mild bilateral ethmoidal and left frontal sinus mucosal thickening. Normal partially imaged portions of the orbital soft tissues. Other: None. IMPRESSION: No acute intracranial hemorrhage or edema. MR brain without contrast could provide a more sensitive exclusion of an acute ischemia as clinically appropriate. Similar distribution of chronic right PCA distribution encephalomalacia, right lacunar infarctions, white matter microvascular ischemic changes, and diminutive pituitary gland. At least moderate circumferential atherosclerotic narrowing of the anterior and posterior intracranial circulation. Electronically Signed   By: Laurence Ferrari   On: 01/22/2020 18:14   DG Chest Port 1 View  Result Date: 01/22/2020 CLINICAL DATA:  Altered, hypotensive EXAM: PORTABLE CHEST 1 VIEW COMPARISON:  10/13/2019 FINDINGS: Post sternotomy changes. Chronic elevation of left diaphragm. Enlarged  cardiomediastinal silhouette with central vascular congestion. No overt edema, pleural effusion, pneumothorax or consolidation. IMPRESSION: Cardiomegaly with central vascular congestion. Negative for edema or consolidative pneumonia Electronically Signed   By: Jasmine Pang M.D.   On: 01/22/2020 17:34    Procedures .Critical Care Performed by: Eber Hong, MD Authorized by: Eber Hong, MD   Critical care provider statement:    Critical care time (minutes):  35   Critical care time was exclusive of:  Separately billable procedures and treating other patients and teaching time   Critical care was necessary to treat or prevent imminent or life-threatening deterioration of the following conditions:  Sepsis   Critical care was time spent personally by me on the following activities:  Blood draw for specimens, development of treatment plan with patient or surrogate, discussions with consultants, evaluation of patient's response to treatment, examination of patient, obtaining history from patient or surrogate, ordering and performing treatments and interventions, ordering and review of laboratory studies, ordering and review of radiographic studies, pulse oximetry, re-evaluation of patient's condition and review of old charts   (including critical care time)  Medications Ordered in ED Medications  insulin aspart (novoLOG) injection 0-9 Units (has no administration in time range)  insulin glargine (LANTUS) injection 5 Units (has no administration in time range)  atorvastatin (LIPITOR) tablet 80 mg (has no administration in time range)  gabapentin (NEURONTIN) capsule 400 mg (has no administration in time range)  aspirin chewable tablet 81 mg (has no administration in  time range)  dipyridamole-aspirin (AGGRENOX) 200-25 MG per 12 hr capsule 1 capsule (has no administration in time range)  0.9 %  sodium chloride infusion (has no administration in time range)  cefTRIAXone (ROCEPHIN) 2 g in sodium  chloride 0.9 % 100 mL IVPB (has no administration in time range)  heparin injection 5,000 Units (has no administration in time range)  lactated ringers bolus 500 mL (has no administration in time range)  lactated ringers bolus 1,000 mL (0 mLs Intravenous Stopped 01/22/20 1837)  naloxone (NARCAN) injection 0.4 mg (0.4 mg Intravenous Given 01/22/20 1743)  cefTRIAXone (ROCEPHIN) 2 g in sodium chloride 0.9 % 100 mL IVPB (0 g Intravenous Stopped 01/22/20 1900)    ED Course  I have reviewed the triage vital signs and the nursing notes.  Pertinent labs & imaging results that were available during my care of the patient were reviewed by me and considered in my medical decision making (see chart for details).    MDM Rules/Calculators/A&P                      It is unclear exactly what happened however this patient has had a significant change from his baseline of mental status, he was severely hypotensive but is responding to fluids currently with a blood pressure of about 78 systolic.  His radial artery is palpated but weak.  He has the need for ongoing resuscitation with fluids, EKG, chest x-ray, labs, CT scan of the brain, continue Narcan, he will be placed on capnography as well.  He will likely need to be admitted to the hospital.  Is not exactly clear what happened but will consider opiate related overdose, stroke, infection, medication side effects, cardiac disease or heart attack as well.  This patient is critically ill with persistent hypotension, no history of cause for sepsis  Antibiotics ordered, IV fluids given, the patient remains hypotensive, he has an elevated lactic acid and a urinalysis which reveals that he likely has urosepsis also accounting for his acute kidney injury.  This patient has at the minimum severe sepsis, discussed with hospitalist who will admit  Sepsis - Repeat Assessment  Performed at:    8:15 PM   Vitals     Blood pressure (!) 88/64, pulse (!) 58, temperature  (!) 95.1 F (35.1 C), temperature source Rectal, resp. rate 15, height 1.753 m (5\' 9" ), weight 126.1 kg, SpO2 100 %.  Heart:     Regular rate and rhythm  Lungs:    CTA  Capillary Refill:   <2 sec  Peripheral Pulse:   Radial pulse palpable  Skin:     Normal Color   was evaluated in Emergency Department on 01/22/2020 for the symptoms described in the history of present illness. He was evaluated in the context of the global COVID-19 pandemic, which necessitated consideration that the patient might be at risk for infection with the SARS-CoV-2 virus that causes COVID-19. Institutional protocols and algorithms that pertain to the evaluation of patients at risk for COVID-19 are in a state of rapid change based on information released by regulatory bodies including the CDC and federal and state organizations. These policies and algorithms were followed during the patient's care in the ED.   Final Clinical Impression(s) / ED Diagnoses Final diagnoses:  Severe sepsis (HCC)  AKI (acute kidney injury) (HCC)      01/24/2020, MD 01/22/20 2015    01/24/20, MD 01/22/20 2018

## 2020-01-22 NOTE — Progress Notes (Signed)
CPAP ordered but not placed on patient at this time. No COVID test result in chart.

## 2020-01-22 NOTE — ED Triage Notes (Signed)
[facility] brought pt from home. Pt unconsious and clammy, bp=55/43 upon arrival. Pt noted to take his meds this morning at 10 am. He takes Neurontin and Oxy . Family called EMS. BS at scene 214. 18 G IV in left forearm from EMS. RA at home but 2 lpm via EMS. HX of DM neuropathy , DM , COPD, Pain , HTN. Previous heart attack about 10 years ago. Pt alert and verbal at this time. Dr in room assessing.

## 2020-01-22 NOTE — Progress Notes (Signed)
D/w Dr. Hyacinth Meeker, we will give ceftriaxone x1 after blood cultures are collected for code sepsis. Further abx by admitting MD.  Ulyses Southward, PharmD, BCIDP, AAHIVP, CPP Infectious Disease Pharmacist 01/22/2020 5:52 PM

## 2020-01-22 NOTE — H&P (Signed)
History and Physical    [name] QJF:[id number] DOB: 06/13/1956 DOA: 01/22/2020  PCP: Fleet Contras, MD  Patient coming from: Home  I have personally briefly reviewed patient's old medical records in [facility]  Chief Complaint: AMS  HPI: [name] is a 64 y.o. male with medical history significant of DM2, CAD s/p CABG, diastolic CHF, HTN, obesity.  Pt brought in to hospital by EMS.  Pt found unconscious by family.  Initial BP with EMS 55 systolic.  Got 800cc IVF with EMS.  BGL 214.  Given narcan.  Gradually woke up.  Reportedly took home meds around 10 including neurontin and some oxy.  Though dont see oxy on his med list.  The patient does not recall any of this, he does remember laying in bed the next thing he knows he is in the hospital.  He denies chest pain coughing or shortness of breath, he states he feels generally weak all over but does not recall feeling poorly prior to that.  There is no visual changes, no nausea or vomiting, no diarrhea, denies swelling of the legs out of the normal for him.   ED Course: In ED, pt awake and oriented, but persistently hypotensive.  BP 77, improved to 98, now down to 88 systolic after 1L LR.  T 95.1.  WBC 8.6k.  BUN 64 and Creat 4.35 (baseline 1.6).  In and out cath: <200cc urine, grossly infected on UA: Large blood, large LE, positive nitrites, >50 WBC, >50 RBC.  UCx, BCx pending, given 2g rocephin.   Review of Systems: As per HPI, otherwise all review of systems negative.  Past Medical History:  Diagnosis Date   Coronary artery disease    Diabetes mellitus    Diastolic heart failure    Hyperlipidemia    Hypertension    Obesity    OSA on CPAP     Past Surgical History:  Procedure Laterality Date   CARDIAC CATHETERIZATION     NO PAST SURGERIES       reports that he has been smoking cigarettes. He has a 15.00 pack-year smoking history. He has never used smokeless tobacco. He reports current  alcohol use of about 4.0 standard drinks of alcohol per week. He reports that he does not use drugs.  No Known Allergies  Family History  Problem Relation Age of Onset   Diabetes Father    Hypertension Father      Prior to Admission medications   Medication Sig Start Date End Date Taking? Authorizing Provider  acetaminophen (TYLENOL) 500 MG tablet Take 1 tablet (500 mg total) by mouth every 6 (six) hours as needed. Patient taking differently: Take 500 mg by mouth every 6 (six) hours as needed for mild pain or headache (pain in neck).  10/13/19  Yes Law, Alexandra M, PA-C  amLODipine (NORVASC) 5 MG tablet Take 1 tablet (5 mg total) by mouth daily. 04/12/19  Yes TatOnalee Hua, MD  aspirin 81 MG chewable tablet Chew 81 mg by mouth daily.   Yes [provider]  atorvastatin (LIPITOR) 80 MG tablet Take 1 tablet (80 mg total) by mouth daily. 02/06/19  Yes Branch, Dorothe Pea, MD  canagliflozin (INVOKANA) 300 MG TABS tablet Take 300 mg by mouth daily before breakfast.   Yes [provider]  diclofenac Sodium (VOLTAREN) 1 % GEL Apply 2 g topically 4 (four) times daily. Patient taking differently: Apply 2 g topically 4 (four) times daily as needed (for neck pain).  10/13/19  Yes Law, Alexandra M, PA-C  dipyridamole-aspirin (AGGRENOX) 200-25 MG per 12 hr capsule Take 1 capsule by mouth 2 (two) times daily.   Yes [provider]  fluticasone (FLONASE) 50 MCG/ACT nasal spray 2 sprays by Each Nare route daily. 08/04/17  Yes [provider]  gabapentin (NEURONTIN) 400 MG capsule Take 400 mg by mouth 3 (three) times daily.   Yes [provider]  Insulin Glargine (LANTUS SOLOSTAR) 100 UNIT/ML Solostar Pen Inject 10 Units into the skin 2 (two) times daily.    Yes [provider]  lisinopril-hydrochlorothiazide (ZESTORETIC) 20-12.5 MG tablet TAKE 1 TABLET BY MOUTH ONCE DAILY. 01/03/20  Yes Branch, Dorothe Pea, MD  metoprolol succinate (TOPROL-XL) 50 MG 24 hr tablet  TAKE 1 AND 1/2 TABLETS BY MOUTH ONCE DAILY. 11/07/19  Yes BranchDorothe Pea, MD  Dulaglutide (TRULICITY) 1.5 MG/0.5ML SOPN Inject 1.5 mg into the skin once a week. On fridays    [provider]    Physical Exam: Vitals:   01/22/20 1940 01/22/20 1941 01/22/20 1942 01/22/20 1943  BP:      Pulse: (!) 58 (!) 56 (!) 56 (!) 58  Resp: 15 20 18 15   Temp:      TempSrc:      SpO2: 100% 100% 100% 100%  Weight:      Height:        Constitutional: NAD, calm, comfortable Eyes: PERRL, lids and conjunctivae normal ENMT: Mucous membranes are moist. Posterior pharynx clear of any exudate or lesions.Normal dentition.  Neck: normal, supple, no masses, no thyromegaly Respiratory: clear to auscultation bilaterally, no wheezing, no crackles. Normal respiratory effort. No accessory muscle use.  Cardiovascular: Regular rate and rhythm, no murmurs / rubs / gallops. No extremity edema. 2+ pedal pulses. No carotid bruits.  Abdomen: no tenderness, no masses palpated. No hepatosplenomegaly. Bowel sounds positive.  Musculoskeletal: no clubbing / cyanosis. No joint deformity upper and lower extremities. Good ROM, no contractures. Normal muscle tone.  Skin: No diabetic foot ulcer Neurologic: CN 2-12 grossly intact. Sensation intact, DTR normal. Strength 5/5 in all 4.  Psychiatric: Normal judgment and insight. Alert and oriented x 3. Normal mood.    Labs on Admission: I have personally reviewed following labs and imaging studies  CBC: Recent Labs  Lab 01/22/20 1736  WBC 8.6  NEUTROABS 6.1  HGB 11.8*  HCT 37.3*  MCV 94.2  PLT 149*   Basic Metabolic Panel: Recent Labs  Lab 01/22/20 1736  NA 135  K 4.7  CL 105  CO2 20*  GLUCOSE 198*  BUN 64*  CREATININE 4.35*  CALCIUM 8.3*   GFR: Estimated Creatinine Clearance: 23.4 mL/min (A) (by C-G formula based on SCr of 4.35 mg/dL (H)). Liver Function Tests: Recent Labs  Lab 01/22/20 1736  AST 20  ALT 14  ALKPHOS 70  BILITOT 0.6  PROT 6.8    ALBUMIN 3.0*   No results for input(s): LIPASE, AMYLASE in the last 168 hours. No results for input(s): AMMONIA in the last 168 hours. Coagulation Profile: Recent Labs  Lab 01/22/20 1736  INR 1.2   Cardiac Enzymes: No results for input(s): CKTOTAL, CKMB, CKMBINDEX, TROPONINI in the last 168 hours. BNP (last 3 results) No results for input(s): PROBNP in the last 8760 hours. HbA1C: No results for input(s): HGBA1C in the last 72 hours. CBG: No results for input(s): GLUCAP in the last 168 hours. Lipid Profile: No results for input(s): CHOL, HDL, LDLCALC, TRIG, CHOLHDL, LDLDIRECT in the last 72 hours. Thyroid Function  Tests: No results for input(s): TSH, T4TOTAL, FREET4, T3FREE, THYROIDAB in the last 72 hours. Anemia Panel: No results for input(s): VITAMINB12, FOLATE, FERRITIN, TIBC, IRON, RETICCTPCT in the last 72 hours. Urine analysis:    Component Value Date/Time   COLORURINE AMBER (A) 01/22/2020 1910   APPEARANCEUR TURBID (A) 01/22/2020 1910   LABSPEC 1.012 01/22/2020 1910   PHURINE 5.0 01/22/2020 1910   GLUCOSEU >=500 (A) 01/22/2020 1910   HGBUR LARGE (A) 01/22/2020 1910   BILIRUBINUR NEGATIVE 01/22/2020 1910   KETONESUR NEGATIVE 01/22/2020 1910   PROTEINUR 100 (A) 01/22/2020 1910   UROBILINOGEN 0.2 04/16/2008 2228   NITRITE POSITIVE (A) 01/22/2020 1910   LEUKOCYTESUR LARGE (A) 01/22/2020 1910    Radiological Exams on Admission: CT Head Wo Contrast  Result Date: 01/22/2020 CLINICAL DATA:  Mental status change EXAM: CT HEAD WITHOUT CONTRAST TECHNIQUE: Contiguous axial images were obtained from the base of the skull through the vertex without intravenous contrast. COMPARISON:  October 13, 2019 FINDINGS: Brain: Similar distribution of chronic encephalomalacia in the right medial parietooccipital lobes compared to 2020. No acute intracranial hemorrhage, midline shift, mass effect or abnormal extra-axial fluid collection. No new loss of gray-white matter junction  differentiation. Similar distribution of white matter hypoattenuation in the bilateral corona radiata. Similar distribution of small lacunar infarctions in the right lentiform nucleus and internal capsule. Chronic diminutive curvilinear pituitary gland. Vascular: At least moderate circumferential atherosclerotic narrowing of the bilateral vertebral and internal carotid arteries. No hyperdense vessel sign. Skull: Surgical pin fixation of the left frontal bone without hardware loosening or failure. Bone demineralization. Stable size and contours of a probable bone infarction in the right greater wing of sphenoid. Sinuses/Orbits: Mild bilateral ethmoidal and left frontal sinus mucosal thickening. Normal partially imaged portions of the orbital soft tissues. Other: None. IMPRESSION: No acute intracranial hemorrhage or edema. MR brain without contrast could provide a more sensitive exclusion of an acute ischemia as clinically appropriate. Similar distribution of chronic right PCA distribution encephalomalacia, right lacunar infarctions, white matter microvascular ischemic changes, and diminutive pituitary gland. At least moderate circumferential atherosclerotic narrowing of the anterior and posterior intracranial circulation. Electronically Signed   By: Revonda Humphrey   On: 01/22/2020 18:14   DG Chest Port 1 View  Result Date: 01/22/2020 CLINICAL DATA:  Altered, hypotensive EXAM: PORTABLE CHEST 1 VIEW COMPARISON:  10/13/2019 FINDINGS: Post sternotomy changes. Chronic elevation of left diaphragm. Enlarged cardiomediastinal silhouette with central vascular congestion. No overt edema, pleural effusion, pneumothorax or consolidation. IMPRESSION: Cardiomegaly with central vascular congestion. Negative for edema or consolidative pneumonia Electronically Signed   By: Donavan Foil M.D.   On: 01/22/2020 17:34    EKG: Independently reviewed.  Assessment/Plan Principal Problem:   Sepsis associated hypotension  (HCC) Active Problems:   DM2 (diabetes mellitus, type 2) (HCC)   Acute renal failure superimposed on stage 2 chronic kidney disease (HCC)   HTN (hypertension)   Sepsis secondary to UTI ([place])    1. Sepsis with hypotension and AKI - 1. Suspect urinary source given the UA findings 1. GN sepsis suspected due to nitrite positive urine 2. Continue Empiric rocephin 3. getting CT renal stone protocol to r/o pyohydronephrosis, he has back pain but states this is chronic and ongoing for past couple of years. 4. Sepsis pathway 5. IVF: 1.8L LR thus far, adding another 500cc to get to 76ml/kg IBW then 125 cc/hr NS 6. Repeat CBC/BMP in AM 2. AKF on CKD - 1. Suspect pre-renal injury / ATN due to #1 above  2. IVF, repeat labs, and imaging as above 3. Strict intake and output 3. DM2 - 1. Hold home meds 1. If UTI is confirmed (as we suspect) as ultimate cause of this sepsis admit, then the invokana becomes suspect as a contributing factor...  Consider wether to resume this med long term or not post hospital stay. 2. Lantus 5u BID 3. Sensitive SSI AC 4. HTN - 1. Hold all home BP meds in setting of sepsis hypotension today. 5. CAD - 1. Cont ASA 2. Cont statin 3. Cont aggrenox  DVT prophylaxis: Heparin [place] Code Status: Full Family Communication: No family in room Disposition Plan: Home after patient stabilized from sepsis standpoint Consults called: None Admission status: Admit to inpatient  Severity of Illness: The appropriate patient status for this patient is INPATIENT. Inpatient status is judged to be reasonable and necessary in order to provide the required intensity of service to ensure the patient's safety. The patient's presenting symptoms, physical exam findings, and initial radiographic and laboratory data in the context of their chronic comorbidities is felt to place them at high risk for further clinical deterioration. Furthermore, it is not anticipated that the patient will be medically  stable for discharge from the hospital within 2 midnights of admission. The following factors support the patient status of inpatient.   IP status due to severe sepsis with hypotension and acute kidney failure with creat going from 1.6 at baseline to 4.35 today.   * I certify that at the point of admission it is my clinical judgment that the patient will require inpatient hospital care spanning beyond 2 midnights from the point of admission due to high intensity of service, high risk for further deterioration and high frequency of surveillance required.*    Kazzandra Desaulniers M. DO [facility]  How to contact the [facility] Attending or Consulting provider 7A - 7P or covering provider during after hours 7P -7A, for this patient?  1. Check the care team in [facility] and look for a) attending/consulting [facility] provider listed and b) the [facility] team listed 2. Log into [email or web address]  Amion Physician Scheduling and messaging for groups and whole hospitals  On call and physician scheduling software for group practices, residents, hospitalists and other medical providers for call, clinic, rotation and shift schedules. On[employer] is a hospital-wide system for scheduling doctors and paging doctors on call. EasyPlot is for scientific plotting and data analysis.  [email or web address]  and use [place] universal password to access. If you do not have the password, please contact the hospital operator.  3. Locate the [facility] provider you are looking for under [facility] and page to a number that you can be directly reached. 4. If you still have difficulty reaching the provider, please page the [facility] (Director on Call) for the Hospitalists listed on amion for assistance.  01/22/2020, 8:12 PM

## 2020-01-23 DIAGNOSIS — A419 Sepsis, unspecified organism: Secondary | ICD-10-CM

## 2020-01-23 DIAGNOSIS — I959 Hypotension, unspecified: Secondary | ICD-10-CM

## 2020-01-23 LAB — HEMOGLOBIN A1C
Hgb A1c MFr Bld: 6.7 % — ABNORMAL HIGH (ref 4.8–5.6)
Mean Plasma Glucose: 145.59 mg/dL

## 2020-01-23 LAB — GLUCOSE, CAPILLARY
Glucose-Capillary: 155 mg/dL — ABNORMAL HIGH (ref 70–99)
Glucose-Capillary: 196 mg/dL — ABNORMAL HIGH (ref 70–99)
Glucose-Capillary: 186 mg/dL — ABNORMAL HIGH (ref 70–99)
Glucose-Capillary: 215 mg/dL — ABNORMAL HIGH (ref 70–99)

## 2020-01-23 LAB — CBC
HCT: 41 % (ref 39.0–52.0)
HCT: 39.9 % (ref 39.0–52.0)
Hemoglobin: 12.7 g/dL — ABNORMAL LOW (ref 13.0–17.0)
Hemoglobin: 12.7 g/dL — ABNORMAL LOW (ref 13.0–17.0)
MCH: 29.3 pg (ref 26.0–34.0)
MCH: 29.8 pg (ref 26.0–34.0)
MCHC: 31 g/dL (ref 30.0–36.0)
MCHC: 31.8 g/dL (ref 30.0–36.0)
MCV: 94.7 fL (ref 80.0–100.0)
MCV: 93.7 fL (ref 80.0–100.0)
Platelets: 158 10*3/uL (ref 150–400)
Platelets: 170 10*3/uL (ref 150–400)
RBC: 4.33 MIL/uL (ref 4.22–5.81)
RBC: 4.26 MIL/uL (ref 4.22–5.81)
RDW: 15.1 % (ref 11.5–15.5)
RDW: 15.2 % (ref 11.5–15.5)
WBC: 7.6 10*3/uL (ref 4.0–10.5)
WBC: 7.5 10*3/uL (ref 4.0–10.5)
nRBC: 0 % (ref 0.0–0.2)
nRBC: 0 % (ref 0.0–0.2)

## 2020-01-23 LAB — PHOSPHORUS: Phosphorus: 4.7 mg/dL — ABNORMAL HIGH (ref 2.5–4.6)

## 2020-01-23 LAB — COMPREHENSIVE METABOLIC PANEL
ALT: 16 U/L (ref 0–44)
ALT: 18 U/L (ref 0–44)
AST: 28 U/L (ref 15–41)
AST: 26 U/L (ref 15–41)
Albumin: 2.4 g/dL — ABNORMAL LOW (ref 3.5–5.0)
Albumin: 2.9 g/dL — ABNORMAL LOW (ref 3.5–5.0)
Alkaline Phosphatase: 58 U/L (ref 38–126)
Alkaline Phosphatase: 60 U/L (ref 38–126)
Anion gap: 10 (ref 5–15)
Anion gap: 13 (ref 5–15)
BUN: 60 mg/dL — ABNORMAL HIGH (ref 8–23)
BUN: 59 mg/dL — ABNORMAL HIGH (ref 8–23)
CO2: 19 mmol/L — ABNORMAL LOW (ref 22–32)
CO2: 17 mmol/L — ABNORMAL LOW (ref 22–32)
Calcium: 7.9 mg/dL — ABNORMAL LOW (ref 8.9–10.3)
Calcium: 8.2 mg/dL — ABNORMAL LOW (ref 8.9–10.3)
Chloride: 109 mmol/L (ref 98–111)
Chloride: 109 mmol/L (ref 98–111)
Creatinine, Ser: 3.65 mg/dL — ABNORMAL HIGH (ref 0.61–1.24)
Creatinine, Ser: 3.14 mg/dL — ABNORMAL HIGH (ref 0.61–1.24)
GFR calc Af Amer: 20 mL/min — ABNORMAL LOW (ref >60)
GFR calc Af Amer: 24 mL/min — ABNORMAL LOW (ref >60)
GFR calc non Af Amer: 17 mL/min — ABNORMAL LOW (ref >60)
GFR calc non Af Amer: 20 mL/min — ABNORMAL LOW (ref >60)
Glucose, Bld: 152 mg/dL — ABNORMAL HIGH (ref 70–99)
Glucose, Bld: 185 mg/dL — ABNORMAL HIGH (ref 70–99)
Potassium: 4.3 mmol/L (ref 3.5–5.1)
Potassium: 4.5 mmol/L (ref 3.5–5.1)
Sodium: 138 mmol/L (ref 135–145)
Sodium: 139 mmol/L (ref 135–145)
Total Bilirubin: 0.4 mg/dL (ref 0.3–1.2)
Total Bilirubin: 0.6 mg/dL (ref 0.3–1.2)
Total Protein: 5.6 g/dL — ABNORMAL LOW (ref 6.5–8.1)
Total Protein: 6.3 g/dL — ABNORMAL LOW (ref 6.5–8.1)

## 2020-01-23 LAB — LACTIC ACID, PLASMA
Lactic Acid, Venous: 1.6 mmol/L (ref 0.5–1.9)
Lactic Acid, Venous: 1.1 mmol/L (ref 0.5–1.9)

## 2020-01-23 LAB — MAGNESIUM: Magnesium: 1.9 mg/dL (ref 1.7–2.4)

## 2020-01-23 LAB — PROCALCITONIN: Procalcitonin: 6.01 ng/mL

## 2020-01-23 LAB — CORTISOL-AM, BLOOD: Cortisol - AM: 17 ug/dL (ref 6.7–22.6)

## 2020-01-23 LAB — MRSA PCR SCREENING: MRSA by PCR: NEGATIVE

## 2020-01-23 MED ORDER — LORAZEPAM 2 MG/ML IJ SOLN: 1.0000 mg | INTRAMUSCULAR | Status: DC | PRN

## 2020-01-23 MED ORDER — ALBUMIN HUMAN 5 % IV SOLN
25.0000 g | Freq: Once | INTRAVENOUS | Status: DC
  Filled 2020-01-23: qty 500

## 2020-01-23 MED ORDER — CHLORHEXIDINE GLUCONATE CLOTH 2 % EX PADS
6.0000 | MEDICATED_PAD | Freq: Every day | CUTANEOUS | Status: DC
  Administered 2020-01-23 – 2020-01-24 (×2): 6 via TOPICAL

## 2020-01-23 MED ORDER — HYDROCORTISONE NA SUCCINATE PF 100 MG IJ SOLR
50.0000 mg | Freq: Three times a day (TID) | INTRAMUSCULAR | Status: DC
  Administered 2020-01-23: 50 mg via INTRAVENOUS
  Filled 2020-01-23: qty 2

## 2020-01-23 MED ORDER — NOREPINEPHRINE 4 MG/250ML-% IV SOLN
0.0000 ug/min | INTRAVENOUS | Status: DC
  Administered 2020-01-23: 2 ug/min via INTRAVENOUS
  Filled 2020-01-23: qty 250

## 2020-01-23 MED ORDER — LORAZEPAM 2 MG/ML IJ SOLN: 0.0000 mg | Freq: Four times a day (QID) | INTRAMUSCULAR | Status: DC

## 2020-01-23 MED ORDER — LORAZEPAM 1 MG PO TABS: 1.0000 mg | ORAL_TABLET | ORAL | Status: DC | PRN

## 2020-01-23 MED ORDER — FOLIC ACID 1 MG PO TABS
1.0000 mg | ORAL_TABLET | Freq: Every day | ORAL | Status: DC
  Administered 2020-01-23 – 2020-01-24 (×2): 1 mg via ORAL
  Filled 2020-01-23 (×2): qty 1

## 2020-01-23 MED ORDER — ALBUMIN HUMAN 25 % IV SOLN
25.0000 g | Freq: Once | INTRAVENOUS | Status: AC
  Administered 2020-01-23: 12.5 g via INTRAVENOUS
  Filled 2020-01-23: qty 100

## 2020-01-23 MED ORDER — GUAIFENESIN-DM 100-10 MG/5ML PO SYRP
5.0000 mL | ORAL_SOLUTION | ORAL | Status: DC | PRN
  Administered 2020-01-23 (×2): 5 mL via ORAL
  Filled 2020-01-23 (×2): qty 5

## 2020-01-23 MED ORDER — THIAMINE HCL 100 MG PO TABS
100.0000 mg | ORAL_TABLET | Freq: Every day | ORAL | Status: DC
  Administered 2020-01-23 – 2020-01-24 (×2): 100 mg via ORAL
  Filled 2020-01-23 (×2): qty 1

## 2020-01-23 MED ORDER — NICOTINE 14 MG/24HR TD PT24
14.0000 mg | MEDICATED_PATCH | Freq: Every day | TRANSDERMAL | Status: DC
  Administered 2020-01-23 – 2020-01-24 (×2): 14 mg via TRANSDERMAL
  Filled 2020-01-23 (×3): qty 1

## 2020-01-23 MED ORDER — SODIUM CHLORIDE 0.9 % IV BOLUS
1000.0000 mL | Freq: Once | INTRAVENOUS | Status: AC
  Administered 2020-01-23: 1000 mL via INTRAVENOUS

## 2020-01-23 MED ORDER — HYDROCORTISONE NA SUCCINATE PF 100 MG IJ SOLR
100.0000 mg | Freq: Once | INTRAMUSCULAR | Status: AC
  Administered 2020-01-23: 100 mg via INTRAVENOUS
  Filled 2020-01-23: qty 2

## 2020-01-23 MED ORDER — THIAMINE HCL 100 MG/ML IJ SOLN: 100.0000 mg | Freq: Every day | INTRAMUSCULAR | Status: DC

## 2020-01-23 MED ORDER — ADULT MULTIVITAMIN W/MINERALS CH
1.0000 | ORAL_TABLET | Freq: Every day | ORAL | Status: DC
  Administered 2020-01-23 – 2020-01-24 (×2): 1 via ORAL
  Filled 2020-01-23 (×2): qty 1

## 2020-01-23 MED ORDER — LORAZEPAM 2 MG/ML IJ SOLN: 0.0000 mg | Freq: Two times a day (BID) | INTRAMUSCULAR | Status: DC

## 2020-01-23 NOTE — Progress Notes (Signed)
Pt's BP has been running low for the majority of my shift. Pt on NS @ 154mL/hr. Paged Dr. Julian Reil for systolic in the 50's and MAP's in the 40's. Received order for 1,000 mL NS bolus 972mL/hr.After receiving this bolus pt BP came up to 72/53 (60). Received another order for another 1,16mL NS bolus at 945mL/hr.

## 2020-01-23 NOTE — Progress Notes (Addendum)
Pts BP still low after those 2 boluses, now total IVF bolus volume is 4.3L and BP 70/45.  No significant UOP per RN, mentation is okay though.  No resp complaints.  Starting levophed through peripheral IV, contacting PCCM to let them know / see if they have any further recommendations.  Dr. Warrick Parisian recommended solucortef for possible adrenal insufficiency, will get this ordered.  Also recd of 5% albumin.

## 2020-01-23 NOTE — Progress Notes (Signed)
COVID test still not in chart. CPAP held at this time.

## 2020-01-23 NOTE — Progress Notes (Signed)
Patient Demographics:    [name], is a 64 y.o. male, DOB - September 19, 1956, PYP:[id number]  Admit date - 01/22/2020   Admitting Physician Hillary Bow, DO  Outpatient Primary MD for the patient is Fleet Contras, MD  LOS - 1   Chief Complaint  Patient presents with  . Drug Overdose        Subjective:    [name] today has no fevers, no emesis,  No chest pain,    --Resting comfortably -Starting to remember some of the events that occurred preadmission -Admits to heavy alcohol use to the point of passing out and losing his driver's license  Assessment  & Plan :    Principal Problem:   Sepsis associated hypotension (HCC) Active Problems:   DM2 (diabetes mellitus, type 2) (HCC)   Acute renal failure superimposed on stage 2 chronic kidney disease (HCC)   HTN (hypertension)   Sepsis secondary to UTI [facility])   1)Persistent Hypotension--- suspect due to overmedication patient apparently forgot to take previously took his BP meds and he took additional doses because she was drinking alcohol -Patient apparently took multiple doses of amlodipine, lisinopril HCTZ and metoprolol -Wean off Levophed drip, a.m. cortisol is 17.0 -Procalcitonin is 6.0 however clinically doubt true significant systemic bacterial infection or sepsis -Will probably discontinue antibiotics on 01/24/2019 if cultures remain negative -Continue IV fluids  2)AKI----acute kidney injury on CKD stage -IIIa  --suspect worsening renal function is due to persistent hypotension compounded by lisinopril/HCTZ -- ---creatinine on admission=4.34  , baseline creatinine =1.5 to 1.6     , creatinine is now=3.14 -- , renally adjust medications, avoid nephrotoxic agents / dehydration  / hypotension  3)CAD--- s/p CABG--no chest pain no ACS type symptoms, continue to hold metoprolol due to hypotension, continue Lipitor and  aspirin  4)HFpEF--patient with history of chronic diastolic dysfunction CHF no recent echo available, continue to hold metoprolol, be judicious with IV fluids  5)HTN--- management as above #1  6)DM2--- A1c 6.7 reflecting excellent diabetic control PTA, continue to hold Invokana due to kidney concerns,  -PTA patient was on Lantus insulin 10 twice daily , this has been reduced to 5 units twice a day --use Novolog/Humalog Sliding scale insulin with Accu-Cheks/Fingersticks as ordered  7) alcohol abuse on risk for DTs-----multivitamin and lorazepam per CIWA protocol -Alcohol cessation discussions with patient recently lost his driver's license due to alcohol abuse    Disposition/Need for in-Hospital Stay- patient unable to be discharged at this time due to --persistent hypotension requiring IV Levophed for pressure support -Anticipate possible discharge home in 1 to 2 days if hemodynamics improve off Levophed  Code Status : full  Family Communication:   NA (patient is alert, awake and coherent)  Consults  :na   DVT Prophylaxis  :   - Heparin - SCDs   Lab Results  Component Value Date   PLT 170 01/23/2020    Inpatient Medications  Scheduled Meds: . aspirin  81 mg Oral Daily  . atorvastatin  80 mg Oral Daily  . Chlorhexidine Gluconate Cloth  6 each Topical Daily  . dipyridamole-aspirin  1 capsule Oral BID  . folic acid  1 mg Oral Daily  . gabapentin  400 mg Oral TID  . heparin  5,000 Units Subcutaneous Q8H  . hydrocortisone sod succinate (SOLU-CORTEF) inj  50 mg Intravenous Q8H  . influenza vac split quadrivalent PF  0.5 mL Intramuscular Tomorrow-1000  . insulin aspart  0-9 Units Subcutaneous TID WC  . insulin glargine  5 Units Subcutaneous BID  . LORazepam  0-4 mg Intravenous Q6H   Followed by  . [START ON 01/25/2020] LORazepam  0-4 mg Intravenous Q12H  . multivitamin with minerals  1 tablet Oral Daily  . nicotine  14 mg Transdermal Daily  . pneumococcal 23 valent vaccine   0.5 mL Intramuscular Tomorrow-1000  . thiamine  100 mg Oral Daily   Or  . thiamine  100 mg Intravenous Daily   Continuous Infusions: . sodium chloride Stopped (01/23/20 1718)  . cefTRIAXone (ROCEPHIN)  IV 200 mL/hr at 01/23/20 1745  . norepinephrine (LEVOPHED) Adult infusion Stopped (01/23/20 1257)   PRN Meds:.guaiFENesin-dextromethorphan, LORazepam **OR** LORazepam    Anti-infectives (From admission, onward)   Start     Dose/Rate Route Frequency Ordered Stop   01/23/20 1800  cefTRIAXone (ROCEPHIN) 2 g in sodium chloride 0.9 % 100 mL IVPB     2 g 200 mL/hr over 30 Minutes Intravenous Every 24 hours 01/22/20 2004     01/22/20 1830  cefTRIAXone (ROCEPHIN) 2 g in sodium chloride 0.9 % 100 mL IVPB     2 g 200 mL/hr over 30 Minutes Intravenous  Once 01/22/20 1752 01/22/20 1900        Objective:   Vitals:   01/23/20 1400 01/23/20 1500 01/23/20 1600 01/23/20 1700  BP: 121/70 117/75 128/79 (!) 141/85  Pulse:    87  Resp: (!) 24 (!) 25 20 17   Temp:   98.4 F (36.9 C)   TempSrc:   Oral   SpO2:      Weight:      Height:        Wt Readings from Last 3 Encounters:  01/22/20 115.3 kg  10/13/19 (!) 140.6 kg  06/13/19 124.3 kg     Intake/Output Summary (Last 24 hours) at 01/23/2020 1811 Last data filed at 01/23/2020 1745 Gross per 24 hour  Intake 4937.72 ml  Output 1875 ml  Net 3062.72 ml     Physical Exam  Gen:- Awake Alert,  In no apparent distress  HEENT:-Forehead and nasal bridge abrasions from falling at home while drunk Neck-Supple Neck,No JVD,.  Lungs-  CTAB , fair symmetrical air movement CV- S1, S2 normal, regular  Abd-  +ve B.Sounds, Abd Soft, No tenderness,    Extremity/Skin:- No  edema, pedal pulses present  Psych-affect is appropriate, oriented x3 Neuro-no new focal deficits, +ve tremors   Data Review:   Micro Results Recent Results (from the past 240 hour(s))  Blood Culture (routine x 2)     Status: None (Preliminary result)   Collection Time:  01/22/20  5:36 PM   Specimen: Right Antecubital; Blood  Result Value Ref Range Status   Specimen Description RIGHT ANTECUBITAL  Final   Special Requests   Final    BOTTLES DRAWN AEROBIC AND ANAEROBIC Blood Culture adequate volume   Culture   Final    NO GROWTH < 24 HOURS Performed at [facility], [address], [place], [place]    Report Status PENDING  Incomplete  Blood Culture (routine x 2)     Status: None (Preliminary result)   Collection Time: 01/22/20  6:10 PM   Specimen: Right Antecubital; Blood  Result Value Ref Range Status   Specimen Description  RIGHT ANTECUBITAL  Final   Special Requests   Final    BOTTLES DRAWN AEROBIC AND ANAEROBIC Blood Culture adequate volume   Culture   Final    NO GROWTH < 24 HOURS Performed at [facility], [address], [place], [place] [address]    Report Status PENDING  Incomplete  MRSA PCR Screening     Status: None   Collection Time: 01/22/20  9:26 PM   Specimen: Nasopharyngeal  Result Value Ref Range Status   MRSA by PCR NEGATIVE NEGATIVE Final    Comment:        The GeneXpert MRSA Assay (FDA approved for NASAL specimens only), is one component of a comprehensive MRSA colonization surveillance program. It is not intended to diagnose MRSA infection nor to guide or monitor treatment for MRSA infections. Performed at [facility], [address], [place], [place] [address]     Radiology Reports CT Head Wo Contrast  Result Date: 01/22/2020 CLINICAL DATA:  Mental status change EXAM: CT HEAD WITHOUT CONTRAST TECHNIQUE: Contiguous axial images were obtained from the base of the skull through the vertex without intravenous contrast. COMPARISON:  October 13, 2019 FINDINGS: Brain: Similar distribution of chronic encephalomalacia in the right medial parietooccipital lobes compared to 2020. No acute intracranial hemorrhage, midline shift, mass effect or abnormal extra-axial fluid collection. No new loss of gray-white matter junction  differentiation. Similar distribution of white matter hypoattenuation in the bilateral corona radiata. Similar distribution of small lacunar infarctions in the right lentiform nucleus and internal capsule. Chronic diminutive curvilinear pituitary gland. Vascular: At least moderate circumferential atherosclerotic narrowing of the bilateral vertebral and internal carotid arteries. No hyperdense vessel sign. Skull: Surgical pin fixation of the left frontal bone without hardware loosening or failure. Bone demineralization. Stable size and contours of a probable bone infarction in the right greater wing of sphenoid. Sinuses/Orbits: Mild bilateral ethmoidal and left frontal sinus mucosal thickening. Normal partially imaged portions of the orbital soft tissues. Other: None. IMPRESSION: No acute intracranial hemorrhage or edema. MR brain without contrast could provide a more sensitive exclusion of an acute ischemia as clinically appropriate. Similar distribution of chronic right PCA distribution encephalomalacia, right lacunar infarctions, white matter microvascular ischemic changes, and diminutive pituitary gland. At least moderate circumferential atherosclerotic narrowing of the anterior and posterior intracranial circulation. Electronically Signed   By: Revonda Humphrey   On: 01/22/2020 18:14   DG Chest Port 1 View  Result Date: 01/22/2020 CLINICAL DATA:  Altered, hypotensive EXAM: PORTABLE CHEST 1 VIEW COMPARISON:  10/13/2019 FINDINGS: Post sternotomy changes. Chronic elevation of left diaphragm. Enlarged cardiomediastinal silhouette with central vascular congestion. No overt edema, pleural effusion, pneumothorax or consolidation. IMPRESSION: Cardiomegaly with central vascular congestion. Negative for edema or consolidative pneumonia Electronically Signed   By: Donavan Foil M.D.   On: 01/22/2020 17:34   CT RENAL STONE STUDY  Result Date: 01/22/2020 CLINICAL DATA:  Flank pain, kidney stone suspected Urinary  retention. Hydronephrosis. Assess for renal stone. EXAM: CT ABDOMEN AND PELVIS WITHOUT CONTRAST TECHNIQUE: Multidetector CT imaging of the abdomen and pelvis was performed following the standard protocol without IV contrast. COMPARISON:  Remote CT 07/17/2006 FINDINGS: Lower chest: Borderline cardiomegaly with coronary artery calcifications. Subpleural opacities in both lower lobes, left greater than right, likely atelectasis or scarring. No confluent consolidation. No pleural fluid. Left lower lobe bronchial thickening. Hepatobiliary: Minimal branching air in the left lobe of the liver, difficult to delineate portal venous gas or pneumobilia. No focal liver lesion on noncontrast exam. Decompressed  gallbladder tentatively visualized, partially obscured by motion. No obvious biliary dilatation. Pancreas: No ductal dilatation or inflammation. Spleen: Prominent size spanning 14.3 cm cranial caudal. No focal lesion. Adrenals/Urinary Tract: Normal adrenal glands. No significant hydronephrosis. Slight prominence of the left ureter but no caliceal dilatation. No renal or ureteral calculi. There is moderate bilateral perinephric edema. Simple cyst in the lower left kidney. Small cyst in the upper left kidney. Urinary bladder is partially distended. There is diffuse bladder wall thickening and mild perivesicular fat stranding. Stomach/Bowel: Fluid/ingested material in the stomach. No obvious gastric wall thickening. Normal positioning of the ligament of Treitz. No small bowel obstruction or inflammation. Tiny intramural lipoma within proximal jejunal small bowel which was seen on prior exam, nonobstructing and incidental. Normal appendix. Fluid/liquid stool in the cecum, ascending, and transverse colon. Diverticulosis of the descending and sigmoid colon without diverticulitis. No colonic wall thickening. No bowel pneumatosis. Vascular/Lymphatic: Advanced aortic and branch atherosclerosis. No air in the main portal vein or  mesenteric veins. No adenopathy. Reproductive: Prominent prostate gland spans 5.4 cm transverse. Other: No free air. No significant ascites. No evidence of intra-abdominal abscess. Musculoskeletal: There are no acute or suspicious osseous abnormalities. Degenerative change in the spine with primarily facet hypertrophy. IMPRESSION: 1. No hydronephrosis, renal or ureteral calculi. Slight prominence of the left ureter without caliceal dilatation. 2. Diffuse bladder wall thickening and perivesicular fat stranding, suspicious for cystitis. Moderate bilateral perinephric edema may be chronic or infectious. 3. Minimal branching air in the left lobe of the liver, difficult to delineate portal venous gas or pneumobilia. This is typically postprocedural pneumobilia, and in isolation is of unknown clinical significance. There is no evidence of bowel ischemia. 4. Colonic diverticulosis without diverticulitis. Mild liquid stool in the proximal colon. Aortic Atherosclerosis (ICD10-I70.0). Electronically Signed   By: Narda Rutherford M.D.   On: 01/22/2020 20:44     CBC Recent Labs  Lab 01/22/20 1736 01/23/20 0435 01/23/20 1008  WBC 8.6 7.6 7.5  HGB 11.8* 12.7* 12.7*  HCT 37.3* 41.0 39.9  PLT 149* 158 170  MCV 94.2 94.7 93.7  MCH 29.8 29.3 29.8  MCHC 31.6 31.0 31.8  RDW 14.9 15.1 15.2  LYMPHSABS 1.4  --   --   MONOABS 1.1*  --   --   EOSABS 0.0  --   --   BASOSABS 0.0  --   --     Chemistries  Recent Labs  Lab 01/22/20 1736 01/23/20 0435 01/23/20 1008  NA 135 138 139  K 4.7 4.3 4.5  CL 105 109 109  CO2 20* 19* 17*  GLUCOSE 198* 152* 185*  BUN 64* 60* 59*  CREATININE 4.35* 3.65* 3.14*  CALCIUM 8.3* 7.9* 8.2*  MG  --   --  1.9  AST 20 28 26   ALT 14 16 18   ALKPHOS 70 58 60  BILITOT 0.6 0.4 0.6   ------------------------------------------------------------------------------------------------------------------ No results for input(s): CHOL, HDL, LDLCALC, TRIG, CHOLHDL, LDLDIRECT in the last 72  hours.  Lab Results  Component Value Date   HGBA1C 6.7 (H) 01/22/2020   ------------------------------------------------------------------------------------------------------------------ No results for input(s): TSH, T4TOTAL, T3FREE, THYROIDAB in the last 72 hours.  Invalid input(s): FREET3 ------------------------------------------------------------------------------------------------------------------ No results for input(s): VITAMINB12, FOLATE, FERRITIN, TIBC, IRON, RETICCTPCT in the last 72 hours.  Coagulation profile Recent Labs  Lab 01/22/20 1736  INR 1.2    No results for input(s): DDIMER in the last 72 hours.  Cardiac Enzymes No results for input(s): CKMB, TROPONINI, MYOGLOBIN in the last 168  hours.  Invalid input(s): CK ------------------------------------------------------------------------------------------------------------------    Component Value Date/Time   BNP 150.0 (H) 01/22/2020 1736   BNP 87.8 09/15/2009 0000     Shon Haleourage [name] M.D on 01/23/2020 at 6:11 PM  Go to [email or web address] - for contact info  [facility] - Office  [phone]

## 2020-01-23 NOTE — Progress Notes (Signed)
CSW at bedside to address consult concerning patients alcohol intake. CSW started conversation with introduction and asked what was his perspective of the incident that led to him being admitted. Patient simply states that he woke up to make himself breakfast and took his medication and later took his medication again because he forgot. Patient explains that he has poor memory although he uses a pill organizer to manage his medications.   CSW inquired about patients home life along with stress factors. Patient states that he resides at home with his mother and his profession consists of working on cars. Patient states that money is a stress factor for him. CSW validated his feelings and agreed that lack of funds poses as a huge stress factor for many people. CSW expressed that there were some concerns around his alcohol intake. Patient reports that he has attended AA meetings during two period throughout his life. 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   CSW counseled patient around reasons people drink to cope. CSW advised patient to create a plan such as visual reminders to let himself know when the last time he took his medication. CSW inquired about patients interest around receiving alcohol treatment. Patient states that he is aware of treatment programs in his area in [place]. Patient goes into detail and states that he does not have access or knowledge to attend AA meetings virtually. Patient concludes conversation by expressing that his experience has been eye opening and will implement a plan to prevent mismanaging his medications. CSW provided patient with handout listing substance abuse and alchohol treatment.   No further needs at this time  Dany Walther Tomma Rakers Transitions of Care  Clinical Social Worker  Ph: [phone]

## 2020-01-24 LAB — BASIC METABOLIC PANEL
Anion gap: 8 (ref 5–15)
BUN: 49 mg/dL — ABNORMAL HIGH (ref 8–23)
CO2: 21 mmol/L — ABNORMAL LOW (ref 22–32)
Calcium: 8.4 mg/dL — ABNORMAL LOW (ref 8.9–10.3)
Chloride: 110 mmol/L (ref 98–111)
Creatinine, Ser: 2.24 mg/dL — ABNORMAL HIGH (ref 0.61–1.24)
GFR calc Af Amer: 35 mL/min — ABNORMAL LOW (ref >60)
GFR calc non Af Amer: 31 mL/min — ABNORMAL LOW (ref >60)
Glucose, Bld: 173 mg/dL — ABNORMAL HIGH (ref 70–99)
Potassium: 3.7 mmol/L (ref 3.5–5.1)
Sodium: 139 mmol/L (ref 135–145)

## 2020-01-24 LAB — GLUCOSE, CAPILLARY
Glucose-Capillary: 159 mg/dL — ABNORMAL HIGH (ref 70–99)
Glucose-Capillary: 208 mg/dL — ABNORMAL HIGH (ref 70–99)

## 2020-01-24 MED ORDER — LISINOPRIL-HYDROCHLOROTHIAZIDE 20-12.5 MG PO TABS: 1.0000 | ORAL_TABLET | Freq: Every day | ORAL | 3 refills | Status: DC

## 2020-01-24 MED ORDER — CEPHALEXIN 500 MG PO CAPS: 500.0000 mg | ORAL_CAPSULE | Freq: Three times a day (TID) | ORAL | 0 refills | Status: AC

## 2020-01-24 MED ORDER — ATORVASTATIN CALCIUM 80 MG PO TABS: 80.0000 mg | ORAL_TABLET | Freq: Every day | ORAL | 3 refills | Status: AC

## 2020-01-24 MED ORDER — METOPROLOL SUCCINATE ER 50 MG PO TB24: 50.0000 mg | ORAL_TABLET | Freq: Every day | ORAL | 3 refills | Status: DC

## 2020-01-24 MED ORDER — THIAMINE HCL 100 MG PO TABS: 100.0000 mg | ORAL_TABLET | Freq: Every day | ORAL | 3 refills | Status: DC

## 2020-01-24 MED ORDER — FOLIC ACID 1 MG PO TABS: 1.0000 mg | ORAL_TABLET | Freq: Every day | ORAL | 2 refills | Status: DC

## 2020-01-24 MED ORDER — SODIUM CHLORIDE 0.9 % IV SOLN: 1.0000 g | Freq: Once | INTRAVENOUS | Status: DC

## 2020-01-24 MED ORDER — AMLODIPINE BESYLATE 5 MG PO TABS: 5.0000 mg | ORAL_TABLET | Freq: Every day | ORAL | 4 refills | Status: DC

## 2020-01-24 NOTE — Discharge Instructions (Signed)
1) you are taking Aggrenox which is a blood thinner so please Avoid ibuprofen/Advil/Aleve/Motrin/Goody Powders/Naproxen/BC powders/Meloxicam/Diclofenac/Indomethacin and other Nonsteroidal anti-inflammatory medications as these will make you more likely to bleed and can cause stomach ulcers, can also cause Kidney problems.   2) please note that there is been few changes to your medication list please take your medications as advised this time around  3) complete abstinence from alcohol advised--- consider AA meetings and other rehab resources as discussed with you previously  4) complete abstinence from tobacco advised--please consider using over-the-counter nicotine patch to help you quit smoking  5) follow-up with your primary care physician in 1 to 2 weeks for recheck and reevaluation of her repeat BMP and CBC blood work

## 2020-01-24 NOTE — Discharge Summary (Signed)
Gabriel MinionLarry L Oberholzer, is a 64 y.o. male  DOB 1956-10-15  MRN [id number].  Admission date:  01/22/2020  Admitting Physician  Hillary BowJared M Gardner, DO  Discharge Date:  01/24/2020   Primary MD  Fleet ContrasAvbuere, Edwin, MD  Recommendations for primary care physician for things to follow:    1) you are taking Aggrenox which is a blood thinner so please Avoid ibuprofen/Advil/Aleve/Motrin/Goody Powders/Naproxen/BC powders/Meloxicam/Diclofenac/Indomethacin and other Nonsteroidal anti-inflammatory medications as these will make you more likely to bleed and can cause stomach ulcers, can also cause Kidney problems.   2) please note that there is been few changes to your medication list please take your medications as advised this time around  3) complete abstinence from alcohol advised--- consider AA meetings and other rehab resources as discussed with you previously  4) complete abstinence from tobacco advised--please consider using over-the-counter nicotine patch to help you quit smoking  5) follow-up with your primary care physician in 1 to 2 weeks for recheck and reevaluation of her repeat BMP and CBC blood work  Admission Diagnosis  AKI (acute kidney injury) (HCC) [N17.9] Sepsis secondary to UTI (HCC) [A41.9, N39.0] Severe sepsis (HCC) [A41.9, R65.20]   Discharge Diagnosis  AKI (acute kidney injury) (HCC) [N17.9] Sepsis secondary to UTI (HCC) [A41.9, N39.0] Severe sepsis (HCC) [A41.9, R65.20]    Principal Problem:   Sepsis associated hypotension (HCC) Active Problems:   DM2 (diabetes mellitus, type 2) (HCC)   Acute renal failure superimposed on stage 2 chronic kidney disease (HCC)   HTN (hypertension)   Sepsis secondary to UTI [facility](HCC)      Past Medical History:  Diagnosis Date  . Coronary artery disease   . Diabetes mellitus   . Diastolic heart failure   . Hyperlipidemia   . Hypertension   . Obesity   . OSA on  CPAP     Past Surgical History:  Procedure Laterality Date  . CARDIAC CATHETERIZATION    . NO PAST SURGERIES         HPI  from the history and physical done on the day of admission:    HPI: [name] is a 64 y.o. male with medical history significant of DM2, CAD s/p CABG, diastolic CHF, HTN, obesity.  Pt brought in to hospital by EMS.  Pt found unconscious by family.  Initial BP with EMS 55 systolic.  Got 800cc IVF with EMS.  BGL 214.  Given narcan.  Gradually woke up.  Reportedly took home meds around 10 including neurontin and some oxy.  Though dont see oxy on his med list.  The patient does not recall any of this, he does remember laying in bed the next thing he knows he is in the hospital. He denies chest pain coughing or shortness of breath, he states he feels generally weak all over but does not recall feeling poorly prior to that. There is no visual changes, no nausea or vomiting, no diarrhea, denies swelling of the legs out of the normal for him.   ED Course: In  ED, pt awake and oriented, but persistently hypotensive.  BP 77, improved to 98, now down to 88 systolic after 1L LR.  T 95.1.  WBC 8.6k.  BUN 64 and Creat 4.35 (baseline 1.6).  In and out cath: <200cc urine, grossly infected on UA: Large blood, large LE, positive nitrites, >50 WBC, >50 RBC.  UCx, BCx pending, given 2g rocephin.    Hospital Course:      1)Persistent Hypotension--- suspect due to overmedication patient apparently forgot to take previously took his BP meds and he took additional doses because he was drinking alcohol -Patient apparently took multiple doses of amlodipine, lisinopril HCTZ and metoprolol -Wean off Levophed drip, a.m. cortisol is 17.0 -Procalcitonin is 6.0 however clinically doubt true significant systemic bacterial infection or sepsis -- -IV fluids discontinued -Treated with IV Rocephin and discharged on p.o. Keflex for E. coli UTI  2)AKI----acute kidney injury  on CKD stage -IIIa  --suspect worsening renal function is due to persistent hypotension compounded by lisinopril/HCTZ -- ---creatinine on admission=4.34  , baseline creatinine =1.5 to 1.6     , creatinine is now=2.2 -- , renally adjust medications, avoid nephrotoxic agents / dehydration  / hypotension -Overall renal function improved significantly with improvement in BP  3) E. coli UTI--- treated with IV Rocephin, discharged on p.o. Keflex  4)CAD--- s/p CABG--no chest pain no ACS type symptoms, okay to restart metoprolol and, continue Lipitor and aspirin  5)HFpEF--patient with history of chronic diastolic dysfunction CHF no recent echo available,    6)HTN--- management as above #1  7)DM2--- A1c 6.7 reflecting excellent diabetic control PTA, -okay to restart Lantus and Invokana  8) alcohol abuse on risk for DTs-----treated with multivitamin and lorazepam per CIWA protocol -No frank DTs -Alcohol cessation discussions with patient recently lost his driver's license due to alcohol abuse -Patient not interested in outpatient alcohol rehab at this time    Disposition--discharged home in stable condition  Follow UP--- PCP as in discharge instructions  Diet and Activity recommendation:  As advised  Discharge Instructions    Discharge Instructions    Call MD for:  difficulty breathing, headache or visual disturbances   Complete by: As directed    Call MD for:  persistant dizziness or light-headedness   Complete by: As directed    Call MD for:  persistant nausea and vomiting   Complete by: As directed    Call MD for:  severe uncontrolled pain   Complete by: As directed    Call MD for:  temperature >100.4   Complete by: As directed    Diet - low sodium heart healthy   Complete by: As directed    Discharge instructions   Complete by: As directed    1) you are taking Aggrenox which is a blood thinner so please Avoid ibuprofen/Advil/Aleve/Motrin/Goody Powders/Naproxen/BC  powders/Meloxicam/Diclofenac/Indomethacin and other Nonsteroidal anti-inflammatory medications as these will make you more likely to bleed and can cause stomach ulcers, can also cause Kidney problems.   2) please note that there is been few changes to your medication list please take your medications as advised this time around  3) complete abstinence from alcohol advised--- consider AA meetings and other rehab resources as discussed with you previously  4) complete abstinence from tobacco advised--please consider using over-the-counter nicotine patch to help you quit smoking  5) follow-up with your primary care physician in 1 to 2 weeks for recheck and reevaluation of her repeat BMP and CBC blood work   Increase activity slowly  Complete by: As directed         Discharge Medications     Allergies as of 01/24/2020   No Known Allergies     Medication List    STOP taking these medications   aspirin 81 MG chewable tablet     TAKE these medications   acetaminophen 500 MG tablet Commonly known as: TYLENOL Take 1 tablet (500 mg total) by mouth every 6 (six) hours as needed. What changed: reasons to take this   amLODipine 5 MG tablet Commonly known as: NORVASC Take 1 tablet (5 mg total) by mouth daily.   atorvastatin 80 MG tablet Commonly known as: LIPITOR Take 1 tablet (80 mg total) by mouth daily.   cephALEXin 500 MG capsule Commonly known as: Keflex Take 1 capsule (500 mg total) by mouth 3 (three) times daily for 3 days. Start taking on: January 25, 2020   diclofenac Sodium 1 % Gel Commonly known as: Voltaren Apply 2 g topically 4 (four) times daily. What changed:   when to take this  reasons to take this   dipyridamole-aspirin 200-25 MG 12hr capsule Commonly known as: AGGRENOX Take 1 capsule by mouth 2 (two) times daily.   fluticasone 50 MCG/ACT nasal spray Commonly known as: FLONASE 2 sprays by Each Nare route daily.   folic acid 1 MG tablet Commonly known  as: FOLVITE Take 1 tablet (1 mg total) by mouth daily. Start taking on: January 25, 2020   gabapentin 400 MG capsule Commonly known as: NEURONTIN Take 400 mg by mouth 3 (three) times daily.   Invokana 300 MG Tabs tablet Generic drug: canagliflozin Take 300 mg by mouth daily before breakfast.   Lantus SoloStar 100 UNIT/ML Solostar Pen Generic drug: insulin glargine Inject 10 Units into the skin 2 (two) times daily.   lisinopril-hydrochlorothiazide 20-12.5 MG tablet Commonly known as: ZESTORETIC Take 1 tablet by mouth daily.   metoprolol succinate 50 MG 24 hr tablet Commonly known as: TOPROL-XL Take 1 tablet (50 mg total) by mouth daily. What changed: how much to take   thiamine 100 MG tablet Take 1 tablet (100 mg total) by mouth daily. Start taking on: January 25, 2020   Trulicity 1.5 MG/0.5ML Sopn Generic drug: Dulaglutide Inject 1.5 mg into the skin once a week. On fridays       Major procedures and Radiology Reports - PLEASE review detailed and final reports for all details, in brief -   CT Head Wo Contrast  Result Date: 01/22/2020 CLINICAL DATA:  Mental status change EXAM: CT HEAD WITHOUT CONTRAST TECHNIQUE: Contiguous axial images were obtained from the base of the skull through the vertex without intravenous contrast. COMPARISON:  October 13, 2019 FINDINGS: Brain: Similar distribution of chronic encephalomalacia in the right medial parietooccipital lobes compared to 2020. No acute intracranial hemorrhage, midline shift, mass effect or abnormal extra-axial fluid collection. No new loss of gray-white matter junction differentiation. Similar distribution of white matter hypoattenuation in the bilateral corona radiata. Similar distribution of small lacunar infarctions in the right lentiform nucleus and internal capsule. Chronic diminutive curvilinear pituitary gland. Vascular: At least moderate circumferential atherosclerotic narrowing of the bilateral vertebral and internal  carotid arteries. No hyperdense vessel sign. Skull: Surgical pin fixation of the left frontal bone without hardware loosening or failure. Bone demineralization. Stable size and contours of a probable bone infarction in the right greater wing of sphenoid. Sinuses/Orbits: Mild bilateral ethmoidal and left frontal sinus mucosal thickening. Normal partially imaged portions of the orbital soft tissues.  Other: None. IMPRESSION: No acute intracranial hemorrhage or edema. MR brain without contrast could provide a more sensitive exclusion of an acute ischemia as clinically appropriate. Similar distribution of chronic right PCA distribution encephalomalacia, right lacunar infarctions, white matter microvascular ischemic changes, and diminutive pituitary gland. At least moderate circumferential atherosclerotic narrowing of the anterior and posterior intracranial circulation. Electronically Signed   By: Laurence Ferrari   On: 01/22/2020 18:14   DG Chest Port 1 View  Result Date: 01/22/2020 CLINICAL DATA:  Altered, hypotensive EXAM: PORTABLE CHEST 1 VIEW COMPARISON:  10/13/2019 FINDINGS: Post sternotomy changes. Chronic elevation of left diaphragm. Enlarged cardiomediastinal silhouette with central vascular congestion. No overt edema, pleural effusion, pneumothorax or consolidation. IMPRESSION: Cardiomegaly with central vascular congestion. Negative for edema or consolidative pneumonia Electronically Signed   By: Jasmine Pang M.D.   On: 01/22/2020 17:34   CT RENAL STONE STUDY  Result Date: 01/22/2020 CLINICAL DATA:  Flank pain, kidney stone suspected Urinary retention. Hydronephrosis. Assess for renal stone. EXAM: CT ABDOMEN AND PELVIS WITHOUT CONTRAST TECHNIQUE: Multidetector CT imaging of the abdomen and pelvis was performed following the standard protocol without IV contrast. COMPARISON:  Remote CT 07/17/2006 FINDINGS: Lower chest: Borderline cardiomegaly with coronary artery calcifications. Subpleural opacities in both  lower lobes, left greater than right, likely atelectasis or scarring. No confluent consolidation. No pleural fluid. Left lower lobe bronchial thickening. Hepatobiliary: Minimal branching air in the left lobe of the liver, difficult to delineate portal venous gas or pneumobilia. No focal liver lesion on noncontrast exam. Decompressed gallbladder tentatively visualized, partially obscured by motion. No obvious biliary dilatation. Pancreas: No ductal dilatation or inflammation. Spleen: Prominent size spanning 14.3 cm cranial caudal. No focal lesion. Adrenals/Urinary Tract: Normal adrenal glands. No significant hydronephrosis. Slight prominence of the left ureter but no caliceal dilatation. No renal or ureteral calculi. There is moderate bilateral perinephric edema. Simple cyst in the lower left kidney. Small cyst in the upper left kidney. Urinary bladder is partially distended. There is diffuse bladder wall thickening and mild perivesicular fat stranding. Stomach/Bowel: Fluid/ingested material in the stomach. No obvious gastric wall thickening. Normal positioning of the ligament of Treitz. No small bowel obstruction or inflammation. Tiny intramural lipoma within proximal jejunal small bowel which was seen on prior exam, nonobstructing and incidental. Normal appendix. Fluid/liquid stool in the cecum, ascending, and transverse colon. Diverticulosis of the descending and sigmoid colon without diverticulitis. No colonic wall thickening. No bowel pneumatosis. Vascular/Lymphatic: Advanced aortic and branch atherosclerosis. No air in the main portal vein or mesenteric veins. No adenopathy. Reproductive: Prominent prostate gland spans 5.4 cm transverse. Other: No free air. No significant ascites. No evidence of intra-abdominal abscess. Musculoskeletal: There are no acute or suspicious osseous abnormalities. Degenerative change in the spine with primarily facet hypertrophy. IMPRESSION: 1. No hydronephrosis, renal or ureteral  calculi. Slight prominence of the left ureter without caliceal dilatation. 2. Diffuse bladder wall thickening and perivesicular fat stranding, suspicious for cystitis. Moderate bilateral perinephric edema may be chronic or infectious. 3. Minimal branching air in the left lobe of the liver, difficult to delineate portal venous gas or pneumobilia. This is typically postprocedural pneumobilia, and in isolation is of unknown clinical significance. There is no evidence of bowel ischemia. 4. Colonic diverticulosis without diverticulitis. Mild liquid stool in the proximal colon. Aortic Atherosclerosis (ICD10-I70.0). Electronically Signed   By: Narda Rutherford M.D.   On: 01/22/2020 20:44    Micro Results   Recent Results (from the past 240 hour(s))  Blood Culture (routine x  2)     Status: None (Preliminary result)   Collection Time: 01/22/20  5:36 PM   Specimen: Right Antecubital; Blood  Result Value Ref Range Status   Specimen Description RIGHT ANTECUBITAL  Final   Special Requests   Final    BOTTLES DRAWN AEROBIC AND ANAEROBIC Blood Culture adequate volume   Culture   Final    NO GROWTH 2 DAYS Performed at [facility], [address], [place], [place] [address]    Report Status PENDING  Incomplete  Blood Culture (routine x 2)     Status: None (Preliminary result)   Collection Time: 01/22/20  6:10 PM   Specimen: Right Antecubital; Blood  Result Value Ref Range Status   Specimen Description RIGHT ANTECUBITAL  Final   Special Requests   Final    BOTTLES DRAWN AEROBIC AND ANAEROBIC Blood Culture adequate volume   Culture   Final    NO GROWTH 2 DAYS Performed at [facility], [address] [place], [place] [address]    Report Status PENDING  Incomplete  Urine culture     Status: Abnormal (Preliminary result)   Collection Time: 01/22/20  7:10 PM   Specimen: In/Out Cath Urine  Result Value Ref Range Status   Specimen Description   Final    IN/OUT CATH URINE Performed at [facility],  [address], [place], [place] [address]    Special Requests   Final    NONE Performed at [facility], [address] [place], [place] [address]    Culture >=100,000 COLONIES/mL ESCHERICHIA COLI (A)  Final   Report Status PENDING  Incomplete  MRSA PCR Screening     Status: None   Collection Time: 01/22/20  9:26 PM   Specimen: Nasopharyngeal  Result Value Ref Range Status   MRSA by PCR NEGATIVE NEGATIVE Final    Comment:        The GeneXpert MRSA Assay (FDA approved for NASAL specimens only), is one component of a comprehensive MRSA colonization surveillance program. It is not intended to diagnose MRSA infection nor to guide or monitor treatment for MRSA infections. Performed at [facility], [address], [place], [place] [address]        Today   Subjective    Jorah Hua today has no new complaints -BP trending higher due to withholding antihypertensives -No chest pain no palpitations no shortness of breath -Patient eager to go home          Patient has been seen and examined prior to discharge   Objective   Blood pressure (!) 161/88, pulse 76, temperature 98.4 F (36.9 C), resp. rate (!) 24, height  (1.753 m), weight 118.8 kg, SpO2 98 %.   Intake/Output Summary (Last 24 hours) at 01/24/2020 1142 Last data filed at 01/24/2020 1015 Gross per 24 hour  Intake 1531.11 ml  Output 2625 ml  Net -1093.89 ml    Exam Gen:- Awake Alert, no acute distress HEENT:- [place].AT, No sclera icterus-Forehead and nasal bridge abrasions from falling at home while drunk Neck-Supple Neck,No JVD,.  Lungs-  CTAB , good air movement bilaterally  CV- S1, S2 normal, regular Abd-  +ve B.Sounds, Abd Soft, No tenderness,    Extremity/Skin:- No  edema,   good pulses Psych-affect is appropriate, oriented x3 Neuro-no new focal deficits, no tremors    Data Review   CBC w Diff:  Lab Results  Component Value Date   WBC 7.5 01/23/2020   HGB 12.7 (L) 01/23/2020   HCT  39.9 01/23/2020     PLT 170 01/23/2020   LYMPHOPCT 16 01/22/2020   MONOPCT 13 01/22/2020   EOSPCT 0 01/22/2020   BASOPCT 0 01/22/2020    CMP:  Lab Results  Component Value Date   NA 139 01/24/2020   K 3.7 01/24/2020   CL 110 01/24/2020   CO2 21 (L) 01/24/2020   BUN 49 (H) 01/24/2020   CREATININE 2.24 (H) 01/24/2020   CREATININE 1.24 07/10/2013   PROT 6.3 (L) 01/23/2020   ALBUMIN 2.9 (L) 01/23/2020   BILITOT 0.6 01/23/2020   ALKPHOS 60 01/23/2020   AST 26 01/23/2020   ALT 18 01/23/2020  .   Total Discharge time is about 33 minutes  Roxan Hockey M.D on 01/24/2020 at 11:42 AM  Go to [email or web address] -  for contact info  [facility] - Office  [phone]

## 2020-01-24 NOTE — Progress Notes (Signed)
Patient ambulated w/o oxygen, no c/o SOB. O2 saturation maintained at 98-100%.

## 2020-01-24 NOTE — Progress Notes (Signed)
Discharge instructions reviewed with patient and family.  Both verbalized understanding of instructions.  Patient discharged home with family is stable condition.

## 2020-01-25 LAB — URINE CULTURE: Culture: >=100000 — AB

## 2020-01-27 LAB — CULTURE, BLOOD (ROUTINE X 2)
Culture: NO GROWTH
Culture: NO GROWTH
Special Requests: ADEQUATE
Special Requests: ADEQUATE

## 2020-02-12 NOTE — Progress Notes (Signed)
Cardiology Office Note    Date:  02/18/2020   ID:  [name], DOB 06-Dec-1955, MRN [id number]  PCP:  Fleet Contras, MD  Cardiologist: Dina Rich, MD EPS: None  Chief Complaint  Patient presents with  . Follow-up    History of Present Illness:  [name] is a 64 y.o. male with history of CAD S/P  CABG 04/2008 (LIMA-LAD, SVG-ramus, SVG to LCX, SVG to RCA).  echo 07/2011 LVEF 45-50% with inferior hypokinesis.HTN,HLD, CVA on aggrenox, CKD not on ACEI   Patient discharge 01/24/20 after hospitalization with sepsis secondary to UTI complicated by hypotension,responded to IV fluids and narcan. AKI Crt  4.34 down to 2.2 at discharge baseline 1.5-1.6. lisinopril/HCTZ held but restarted. Alcohol cessation recommended.  Patient comes in for f/u. Denies chest pain, shortness of breath. Smokes 1/2 ppd, drinks 2 beers daily, 1 pint liquor on weekends. Says his aggrenox was stopped last    Past Medical History:  Diagnosis Date  . Coronary artery disease   . Diabetes mellitus   . Diastolic heart failure   . Hyperlipidemia   . Hypertension   . Obesity   . OSA on CPAP     Past Surgical History:  Procedure Laterality Date  . CARDIAC CATHETERIZATION    . NO PAST SURGERIES      Current Medications: Current Meds  Medication Sig  . acetaminophen (TYLENOL) 500 MG tablet Take 1 tablet (500 mg total) by mouth every 6 (six) hours as needed. (Patient taking differently: Take 500 mg by mouth every 6 (six) hours as needed for mild pain or headache (pain in neck). )  . amLODipine (NORVASC) 5 MG tablet Take 1 tablet (5 mg total) by mouth daily.  Marland Kitchen atorvastatin (LIPITOR) 80 MG tablet Take 1 tablet (80 mg total) by mouth daily.  . canagliflozin (INVOKANA) 300 MG TABS tablet Take 300 mg by mouth daily before breakfast.  . diclofenac Sodium (VOLTAREN) 1 % GEL Apply 2 g topically 4 (four) times daily. (Patient taking differently: Apply 2 g topically 4 (four) times daily as needed (for  neck pain). )  . Dulaglutide (TRULICITY) 1.5 MG/0.5ML SOPN Inject 1.5 mg into the skin once a week. On fridays  . fluticasone (FLONASE) 50 MCG/ACT nasal spray 2 sprays by Each Nare route daily.  . folic acid (FOLVITE) 1 MG tablet Take 1 tablet (1 mg total) by mouth daily.  Marland Kitchen gabapentin (NEURONTIN) 400 MG capsule Take 400 mg by mouth 3 (three) times daily.  . Insulin Glargine (LANTUS SOLOSTAR) 100 UNIT/ML Solostar Pen Inject 10 Units into the skin 2 (two) times daily.   Marland Kitchen lisinopril-hydrochlorothiazide (ZESTORETIC) 20-12.5 MG tablet Take 1 tablet by mouth daily.  . metoprolol succinate (TOPROL-XL) 50 MG 24 hr tablet Take 1 tablet (50 mg total) by mouth daily.  Marland Kitchen thiamine 100 MG tablet Take 1 tablet (100 mg total) by mouth daily.     Allergies:   Patient has no known allergies.   Social History   Socioeconomic History  . Marital status: Single    Spouse name: Not on file  . Number of children: Not on file  . Years of education: Not on file  . Highest education level: Not on file  Occupational History  . Not on file  Tobacco Use  . Smoking status: Current Every Day Smoker    Packs/day: 1.00    Years: 15.00    Pack years: 15.00    Types: Cigarettes  . Smokeless tobacco: Never Used  .  Tobacco comment: Down to a cigarette per week  Substance and Sexual Activity  . Alcohol use: Yes    Alcohol/week: 9.0 standard drinks    Types: 2 Shots of liquor, 7 Cans of beer per week    Comment: occ  . Drug use: No  . Sexual activity: Never  Other Topics Concern  . Not on file  Social History Narrative  . Not on file   Social Determinants of Health   Financial Resource Strain:   . Difficulty of Paying Living Expenses:   Food Insecurity:   . Worried About Programme researcher, broadcasting/film/video in the Last Year:   . Barista in the Last Year:   Transportation Needs:   . Freight forwarder (Medical):   Marland Kitchen Lack of Transportation (Non-Medical):   Physical Activity:   . Days of Exercise per Week:     . Minutes of Exercise per Session:   Stress: Stress Concern Present  . Feeling of Stress : To some extent  Social Connections:   . Frequency of Communication with Friends and Family:   . Frequency of Social Gatherings with Friends and Family:   . Attends Religious Services:   . Active Member of Clubs or Organizations:   . Attends Banker Meetings:   Marland Kitchen Marital Status:      Family History:  The patient's   family history includes Diabetes in his father; Hypertension in his father.   ROS:   Please see the history of present illness.    ROS All other systems reviewed and are negative.   PHYSICAL EXAM:   VS:  BP 130/88   Pulse 85   Temp 99 F (37.2 C)   Ht 6\' 1"  (1.854 m)   Wt 256 lb (116.1 kg)   SpO2 95%   BMI 33.78 kg/m   Physical Exam  GEN: Well nourished, well developed, in no acute distress  Neck: no JVD, carotid bruits, or masses Cardiac:RRR; 1/6 systolic murmur at the left sternal border Respiratory:  clear to auscultation bilaterally, normal work of breathing GI: soft, nontender, nondistended, + BS Ext: +2 edema bilaterally without cyanosis, clubbing, Good distal pulses bilaterally Neuro:  Alert and Oriented x 3 Psych: euthymic mood, full affect  Wt Readings from Last 3 Encounters:  02/18/20 256 lb (116.1 kg)  01/24/20 261 lb 14.5 oz (118.8 kg)  10/13/19 (!) 310 lb (140.6 kg)      Studies/Labs Reviewed:   EKG:  EKG is not ordered today.    Recent Labs: 01/22/2020: B Natriuretic Peptide 150.0 01/23/2020: ALT 18; Hemoglobin 12.7; Magnesium 1.9; Platelets 170 01/24/2020: BUN 49; Creatinine, Ser 2.24; Potassium 3.7; Sodium 139   Lipid Panel    Component Value Date/Time   CHOL 116 07/10/2013 0842   TRIG 190 (H) 07/10/2013 0842   TRIG 229 09/23/2008 0000   HDL 32 (L) 07/10/2013 0842   CHOLHDL 3.6 07/10/2013 0842   VLDL 38 07/10/2013 0842   LDLCALC 46 07/10/2013 0842   LDLDIRECT 67 09/23/2008 0000    Additional studies/ records that were  reviewed today include:  07/2011 Echo LVEF 45-50%, mod biatrial enlargement, diastolic function not described.    05/2012 Carotid 06/2012  IMPRESSION: Bilateral atherosclerosis, not resulting in hemodynamically significant stenosis.   Jan 2016 ABI IMPRESSION: Normal ABIs at rest. Nonocclusive peripheral vascular disease, noncompressible in the left thigh and calf regions.       ASSESSMENT:    1. Coronary artery disease involving coronary bypass graft of  native heart without angina pectoris   2. Chronic diastolic CHF (congestive heart failure) ([place])   3. Essential hypertension   4. Hyperlipidemia, unspecified hyperlipidemia type   5. History of CVA (cerebrovascular accident)   6. Stage 3 chronic kidney disease, unspecified whether stage 3a or 3b CKD      PLAN:  In order of problems listed above:   CAD S/P  CABG 04/2008 (LIMA-LAD, SVG-ramus, SVG to LCX, SVG to RCA)-no angina, not on ASA because he was supposed to be on aggrenox but he's not taking. Restart ASA 81 mg daily  Chronic diastolic CHF-has leg edema today but patient says it's because he's not wearing compression stockings. Will check bnp  Essential HTN BP controlled  HLD on atorvastatin managed by PCP  CVA was on aggrenox but pharmacy says last filled 05/2019. I can't find anywhere in the chart that it was stopped. Needs to f/u with PCP/neuro for this.  CKD Crt 2.24 01/24/20. Recheck today.  ETOH- still drinking daily and heavy on weekends. Decrease alcohol use.  Medication Adjustments/Labs and Tests Ordered: Current medicines are reviewed at length with the patient today.  Concerns regarding medicines are outlined above.  Medication changes, Labs and Tests ordered today are listed in the Patient Instructions below. Patient Instructions  Medication Instructions:  Your physician recommends that you continue on your current medications as directed. Please refer to the Current Medication list given to you today.  Start  Aspirin 81 mg Daily   *If you need a refill on your cardiac medications before your next appointment, please call your pharmacy*   Lab Work: Your physician recommends that you return for lab work in: Today   If you have labs (blood work) drawn today and your tests are completely normal, you will receive your results only by: Marland Kitchen MyChart Message (if you have MyChart) OR . A paper copy in the mail If you have any lab test that is abnormal or we need to change your treatment, we will call you to review the results.   Testing/Procedure NONE     Follow-Up: At [facility], you and your health needs are our priority.  As part of our continuing mission to provide you with exceptional heart care, we have created designated Provider Care Teams.  These Care Teams include your primary Cardiologist (physician) and Advanced Practice Providers (APPs -  Physician Assistants and Nurse Practitioners) who all work together to provide you with the care you need, when you need it.  We recommend signing up for the patient portal called "MyChart".  Sign up information is provided on this After Visit Summary.  MyChart is used to connect with patients for Virtual Visits (Telemedicine).  Patients are able to view lab/test results, encounter notes, upcoming appointments, etc.  Non-urgent messages can be sent to your provider as well.   To learn more about what you can do with MyChart, go to [email or web address].    Your next appointment:   2 month(s)  The format for your next appointment:   In Person  Provider:   Carlyle Dolly, MD   Other Instructions Follow up with PCP and Neurology  Thank you for choosing [facility]!       Signed, Ermalinda Barrios, PA-C  02/18/2020 1:01 PM    [facility] [address], [place], [place]  [address] Phone: [phone]; Fax: [phone]

## 2020-02-18 ENCOUNTER — Other Ambulatory Visit (HOSPITAL_COMMUNITY)
Admission: RE | Admit: 2020-02-18 | Discharge: 2020-02-18 | Disposition: A | Discharge status: Home / Self Care | Payer: Medicaid Other | Source: Ambulatory Visit | Attending: Physician Assistant | Admitting: Physician Assistant

## 2020-02-18 ENCOUNTER — Other Ambulatory Visit: Payer: Self-pay

## 2020-02-18 ENCOUNTER — Ambulatory Visit (INDEPENDENT_AMBULATORY_CARE_PROVIDER_SITE_OTHER): Payer: Medicaid Other | Admitting: Physician Assistant

## 2020-02-18 ENCOUNTER — Encounter: Payer: Self-pay | Admitting: Physician Assistant

## 2020-02-18 VITALS — BP 130/88 | HR 85 | Temp 99.0°F | Ht 73.0 in | Wt 256.0 lb

## 2020-02-18 DIAGNOSIS — I10 Essential (primary) hypertension: Secondary | ICD-10-CM

## 2020-02-18 DIAGNOSIS — I25810 Atherosclerosis of coronary artery bypass graft(s) without angina pectoris: Secondary | ICD-10-CM | POA: Diagnosis not present

## 2020-02-18 DIAGNOSIS — N183 Chronic kidney disease, stage 3 unspecified: Secondary | ICD-10-CM

## 2020-02-18 DIAGNOSIS — Z8673 Personal history of transient ischemic attack (TIA), and cerebral infarction without residual deficits: Secondary | ICD-10-CM

## 2020-02-18 DIAGNOSIS — E785 Hyperlipidemia, unspecified: Secondary | ICD-10-CM

## 2020-02-18 DIAGNOSIS — I5032 Chronic diastolic (congestive) heart failure: Secondary | ICD-10-CM

## 2020-02-18 LAB — BASIC METABOLIC PANEL
Anion gap: 9 (ref 5–15)
BUN: 36 mg/dL — ABNORMAL HIGH (ref 8–23)
CO2: 26 mmol/L (ref 22–32)
Calcium: 9.4 mg/dL (ref 8.9–10.3)
Chloride: 101 mmol/L (ref 98–111)
Creatinine, Ser: 1.5 mg/dL — ABNORMAL HIGH (ref 0.61–1.24)
GFR calc Af Amer: 57 mL/min — ABNORMAL LOW (ref >60)
GFR calc non Af Amer: 50 mL/min — ABNORMAL LOW (ref >60)
Glucose, Bld: 110 mg/dL — ABNORMAL HIGH (ref 70–99)
Potassium: 5 mmol/L (ref 3.5–5.1)
Sodium: 136 mmol/L (ref 135–145)

## 2020-02-18 LAB — BRAIN NATRIURETIC PEPTIDE: B Natriuretic Peptide: 144 pg/mL — ABNORMAL HIGH (ref 0.0–100.0)

## 2020-02-18 MED ORDER — ASPIRIN EC 81 MG PO TBEC: 81.0000 mg | DELAYED_RELEASE_TABLET | Freq: Every day | ORAL | 3 refills | Status: DC

## 2020-02-18 NOTE — Patient Instructions (Signed)
Medication Instructions:  Your physician recommends that you continue on your current medications as directed. Please refer to the Current Medication list given to you today.  Start Aspirin 81 mg Daily   *If you need a refill on your cardiac medications before your next appointment, please call your pharmacy*   Lab Work: Your physician recommends that you return for lab work in: Today   If you have labs (blood work) drawn today and your tests are completely normal, you will receive your results only by: Marland Kitchen MyChart Message (if you have MyChart) OR . A paper copy in the mail If you have any lab test that is abnormal or we need to change your treatment, we will call you to review the results.   Testing/Procedure NONE     Follow-Up: At [facility], you and your health needs are our priority.  As part of our continuing mission to provide you with exceptional heart care, we have created designated Provider Care Teams.  These Care Teams include your primary Cardiologist (physician) and Advanced Practice Providers (APPs -  Physician Assistants and Nurse Practitioners) who all work together to provide you with the care you need, when you need it.  We recommend signing up for the patient portal called "MyChart".  Sign up information is provided on this After Visit Summary.  MyChart is used to connect with patients for Virtual Visits (Telemedicine).  Patients are able to view lab/test results, encounter notes, upcoming appointments, etc.  Non-urgent messages can be sent to your provider as well.   To learn more about what you can do with MyChart, go to [email or web address].    Your next appointment:   2 month(s)  The format for your next appointment:   In Person  Provider:   Dina Rich, MD   Other Instructions Follow up with PCP and Neurology  Thank you for choosing [facility]!

## 2020-04-27 ENCOUNTER — Ambulatory Visit (INDEPENDENT_AMBULATORY_CARE_PROVIDER_SITE_OTHER): Payer: Medicaid Other | Admitting: Cardiology

## 2020-04-27 ENCOUNTER — Encounter: Payer: Self-pay | Admitting: Cardiology

## 2020-04-27 ENCOUNTER — Other Ambulatory Visit: Payer: Self-pay

## 2020-04-27 VITALS — BP 122/64 | HR 78 | Ht 73.0 in | Wt 255.0 lb

## 2020-04-27 DIAGNOSIS — I10 Essential (primary) hypertension: Secondary | ICD-10-CM | POA: Diagnosis not present

## 2020-04-27 DIAGNOSIS — E782 Mixed hyperlipidemia: Secondary | ICD-10-CM | POA: Diagnosis not present

## 2020-04-27 DIAGNOSIS — I25810 Atherosclerosis of coronary artery bypass graft(s) without angina pectoris: Secondary | ICD-10-CM

## 2020-04-27 NOTE — Patient Instructions (Signed)
Medication Instructions:  Your physician recommends that you continue on your current medications as directed. Please refer to the Current Medication list given to you today.  *If you need a refill on your cardiac medications before your next appointment, please call your pharmacy*   Lab Work: None today If you have labs (blood work) drawn today and your tests are completely normal, you will receive your results only by: . MyChart Message (if you have MyChart) OR . A paper copy in the mail If you have any lab test that is abnormal or we need to change your treatment, we will call you to review the results.   Testing/Procedures: None today   Follow-Up: At [facility], you and your health needs are our priority.  As part of our continuing mission to provide you with exceptional heart care, we have created designated Provider Care Teams.  These Care Teams include your primary Cardiologist (physician) and Advanced Practice Providers (APPs -  Physician Assistants and Nurse Practitioners) who all work together to provide you with the care you need, when you need it.  We recommend signing up for the patient portal called "MyChart".  Sign up information is provided on this After Visit Summary.  MyChart is used to connect with patients for Virtual Visits (Telemedicine).  Patients are able to view lab/test results, encounter notes, upcoming appointments, etc.  Non-urgent messages can be sent to your provider as well.   To learn more about what you can do with MyChart, go to [email or web address].    Your next appointment:   6 month(s)  The format for your next appointment:   In Person  Provider:   Jonathan Branch, MD   Other Instructions None       Thank you for choosing [facility] !         

## 2020-04-27 NOTE — Progress Notes (Signed)
Clinical Summary Mr. Lazenby is a 64 y.o.male seen today for follow up of the following medical problems.   1. 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  - echo 07/2011 LVEF 45-50% with inferior hypokinesis.    - no chest pain. No SOB/DOE  - compliant with meds  2. HTN - compliant with meds    3. Hyperlipidemia -compliant withmeds  4.History ofCVA - on aggrenox for secondary prevention, as well as statin. ACE-I stopped 04/2019 during admission with AKI - had bee no aggrenox, appears no longer on. He is on plavix, pill bottle had Dr Avbuere's name on it  5. CKD II - admit 04/2019 with AKI on CKD, Cr up to 1.56. Lisionpril/HCTZ was stopped, started on norvasc instead during that admission  6. EtOH use    SH: works as Dealer   Past Medical History:  Diagnosis Date  . Coronary artery disease   . Diabetes mellitus   . Diastolic heart failure   . Hyperlipidemia   . Hypertension   . Obesity   . OSA on CPAP      No Known Allergies   Current Outpatient Medications  Medication Sig Dispense Refill  . acetaminophen (TYLENOL) 500 MG tablet Take 1 tablet (500 mg total) by mouth every 6 (six) hours as needed. (Patient taking differently: Take 500 mg by mouth every 6 (six) hours as needed for mild pain or headache (pain in neck). ) 30 tablet 0  . amLODipine (NORVASC) 5 MG tablet Take 1 tablet (5 mg total) by mouth daily. 30 tablet 4  . aspirin EC 81 MG tablet Take 1 tablet (81 mg total) by mouth daily. 90 tablet 3  . atorvastatin (LIPITOR) 80 MG tablet Take 1 tablet (80 mg total) by mouth daily. 90 tablet 3  . canagliflozin (INVOKANA) 300 MG TABS tablet Take 300 mg by mouth daily before breakfast.    . diclofenac Sodium (VOLTAREN) 1 % GEL Apply 2 g topically 4 (four) times daily. (Patient taking differently: Apply 2 g topically 4 (four) times daily as needed (for neck pain). ) 50 g 0  . Dulaglutide (TRULICITY) 1.5 ZO/1.0RU  SOPN Inject 1.5 mg into the skin once a week. On fridays    . fluticasone (FLONASE) 50 MCG/ACT nasal spray 2 sprays by Each Nare route daily.    . folic acid (FOLVITE) 1 MG tablet Take 1 tablet (1 mg total) by mouth daily. 30 tablet 2  . gabapentin (NEURONTIN) 400 MG capsule Take 400 mg by mouth 3 (three) times daily.    . Insulin Glargine (LANTUS SOLOSTAR) 100 UNIT/ML Solostar Pen Inject 10 Units into the skin 2 (two) times daily.     Marland Kitchen lisinopril-hydrochlorothiazide (ZESTORETIC) 20-12.5 MG tablet Take 1 tablet by mouth daily. 30 tablet 3  . metoprolol succinate (TOPROL-XL) 50 MG 24 hr tablet Take 1 tablet (50 mg total) by mouth daily. 30 tablet 3  . thiamine 100 MG tablet Take 1 tablet (100 mg total) by mouth daily. 30 tablet 3   No current facility-administered medications for this visit.     Past Surgical History:  Procedure Laterality Date  . CARDIAC CATHETERIZATION    . NO PAST SURGERIES       No Known Allergies    Family History  Problem Relation Age of Onset  . Diabetes Father   . Hypertension Father      Social History Mr. Duecker reports that he has been smoking cigarettes.  He has a 15.00 pack-year smoking history. He has never used smokeless tobacco. Mr. Tullis reports current alcohol use of about 9.0 standard drinks of alcohol per week.   Review of Systems CONSTITUTIONAL: No weight loss, fever, chills, weakness or fatigue.  HEENT: Eyes: No visual loss, blurred vision, double vision or yellow sclerae.No hearing loss, sneezing, congestion, runny nose or sore throat.  SKIN: No rash or itching.  CARDIOVASCULAR: per hpi RESPIRATORY: No shortness of breath, cough or sputum.  GASTROINTESTINAL: No anorexia, nausea, vomiting or diarrhea. No abdominal pain or blood.  GENITOURINARY: No burning on urination, no polyuria NEUROLOGICAL: No headache, dizziness, syncope, paralysis, ataxia, numbness or tingling in the extremities. No change in bowel or bladder control.    MUSCULOSKELETAL: No muscle, back pain, joint pain or stiffness.  LYMPHATICS: No enlarged nodes. No history of splenectomy.  PSYCHIATRIC: No history of depression or anxiety.  ENDOCRINOLOGIC: No reports of sweating, cold or heat intolerance. No polyuria or polydipsia.  Marland Kitchen   Physical Examination Today's Vitals   04/27/20 0854  BP: 122/64  Pulse: 78  SpO2: 96%  Weight: 255 lb (115.7 kg)  Height: 6\' 1"  (1.854 m)   Body mass index is 33.64 kg/m.  Gen: resting comfortably, no acute distress HEENT: no scleral icterus, pupils equal round and reactive, no palptable cervical adenopathy,  CV: RRR, no m/r/g, no jvd Resp: Clear to auscultation bilaterally GI: abdomen is soft, non-tender, non-distended, normal bowel sounds, no hepatosplenomegaly MSK: extremities are warm, no edema.  Skin: warm, no rash Neuro:  no focal deficits Psych: appropriate affect   Diagnostic Studies 07/2011 Echo LVEF 45-50%, mod biatrial enlargement, diastolic function not described.   05/2012 Carotid 06/2012 IMPRESSION: Bilateral atherosclerosis, not resulting in hemodynamically significant stenosis.  Jan 2016 ABI IMPRESSION: Normal ABIs at rest. Nonocclusive peripheral vascular disease, noncompressible in the left thigh and calf regions.    Assessment and Plan   1. CAD - no symptoms, continue current meds - he is on plavix by another provider, not for any cardiac issue  2. HTN - at goal, continue current meds  3. Hyperlipidemia - continue statin, request pcp labs.      Feb 2016, M.D.

## 2020-05-07 ENCOUNTER — Other Ambulatory Visit: Payer: Self-pay | Admitting: Cardiology

## 2020-08-10 ENCOUNTER — Telehealth: Payer: Self-pay | Admitting: Cardiology

## 2020-08-10 MED ORDER — LISINOPRIL-HYDROCHLOROTHIAZIDE 20-12.5 MG PO TABS: 1.0000 | ORAL_TABLET | Freq: Every day | ORAL | 3 refills | Status: DC

## 2020-08-10 NOTE — Telephone Encounter (Signed)
Refilled to [facility] as requested

## 2020-08-10 NOTE — Telephone Encounter (Signed)
New message     *STAT* If patient is at the pharmacy, call can be transferred to refill team.   1. Which medications need to be refilled? (please list name of each medication and dose if known) lisinopril-hydrochlorothiazide (ZESTORETIC) 20-12.5 MG tablet   2. Which pharmacy/location (including street and city if local pharmacy) is medication to be sent to? [facility]  3. Do they need a 30 day or 90 day supply? 30

## 2020-10-16 ENCOUNTER — Encounter: Payer: Self-pay | Admitting: Cardiology

## 2020-10-16 ENCOUNTER — Ambulatory Visit (INDEPENDENT_AMBULATORY_CARE_PROVIDER_SITE_OTHER): Payer: Medicaid Other | Admitting: Cardiology

## 2020-10-16 ENCOUNTER — Other Ambulatory Visit: Payer: Self-pay

## 2020-10-16 VITALS — BP 136/72 | HR 81 | Ht 73.0 in | Wt 255.2 lb

## 2020-10-16 DIAGNOSIS — I10 Essential (primary) hypertension: Secondary | ICD-10-CM | POA: Diagnosis not present

## 2020-10-16 DIAGNOSIS — I25810 Atherosclerosis of coronary artery bypass graft(s) without angina pectoris: Secondary | ICD-10-CM

## 2020-10-16 DIAGNOSIS — E782 Mixed hyperlipidemia: Secondary | ICD-10-CM | POA: Diagnosis not present

## 2020-10-16 NOTE — Patient Instructions (Signed)
Medication Instructions:  Your physician recommends that you continue on your current medications as directed. Please refer to the Current Medication list given to you today.  *If you need a refill on your cardiac medications before your next appointment, please call your pharmacy*   Lab Work: None Today If you have labs (blood work) drawn today and your tests are completely normal, you will receive your results only by: . MyChart Message (if you have MyChart) OR . A paper copy in the mail If you have any lab test that is abnormal or we need to change your treatment, we will call you to review the results.   Testing/Procedures: None Today   Follow-Up: At [facility], you and your health needs are our priority.  As part of our continuing mission to provide you with exceptional heart care, we have created designated Provider Care Teams.  These Care Teams include your primary Cardiologist (physician) and Advanced Practice Providers (APPs -  Physician Assistants and Nurse Practitioners) who all work together to provide you with the care you need, when you need it.  We recommend signing up for the patient portal called "MyChart".  Sign up information is provided on this After Visit Summary.  MyChart is used to connect with patients for Virtual Visits (Telemedicine).  Patients are able to view lab/test results, encounter notes, upcoming appointments, etc.  Non-urgent messages can be sent to your provider as well.   To learn more about what you can do with MyChart, go to [email or web address].    Your next appointment:   6 month(s)  The format for your next appointment:   In Person  Provider:   Jonathan Branch, MD   Other Instructions None Today     

## 2020-10-16 NOTE — Progress Notes (Signed)
Clinical Summary [name] is a 64 y.o.male seen today for follow up of the following medical problems.   1. 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  - echo 07/2011 LVEF 45-50% with inferior hypokinesis.    - denies any chest pain, no SOB or DOE - compliant with meds  2. HTN - he is compliant with meds    3. Hyperlipidemia -compliant with meds  4.History ofCVA - on aggrenox for secondary prevention, as well as statin. ACE-I stopped 04/2019 during admission with AKI - had bee no aggrenox, appears no longer on. He is on plavix, pill bottle had Dr Avbuere's name on it    5. EtOH use   Has had covid vaccine x 2 moderna, 2nd shot in August.   Past Medical History:  Diagnosis Date  . Coronary artery disease   . Diabetes mellitus   . Diastolic heart failure   . Hyperlipidemia   . Hypertension   . Obesity   . OSA on CPAP      No Known Allergies   Current Outpatient Medications  Medication Sig Dispense Refill  . acetaminophen (TYLENOL) 500 MG tablet Take 1 tablet (500 mg total) by mouth every 6 (six) hours as needed. (Patient taking differently: Take 500 mg by mouth every 6 (six) hours as needed for mild pain or headache (pain in neck). ) 30 tablet 0  . amLODipine (NORVASC) 5 MG tablet Take 1 tablet (5 mg total) by mouth daily. 30 tablet 4  . aspirin EC 81 MG tablet Take 1 tablet (81 mg total) by mouth daily. 90 tablet 3  . atorvastatin (LIPITOR) 80 MG tablet Take 1 tablet (80 mg total) by mouth daily. 90 tablet 3  . canagliflozin (INVOKANA) 300 MG TABS tablet Take 300 mg by mouth daily before breakfast.    . clopidogrel (PLAVIX) 75 MG tablet Take 75 mg by mouth daily.    . diclofenac Sodium (VOLTAREN) 1 % GEL Apply 2 g topically 4 (four) times daily. (Patient taking differently: Apply 2 g topically 4 (four) times daily as needed (for neck pain). ) 50 g 0  . Dulaglutide (TRULICITY) 1.5 MG/0.5ML SOPN Inject 1.5 mg into  the skin once a week. On fridays    . fluticasone (FLONASE) 50 MCG/ACT nasal spray 2 sprays by Each Nare route daily.    . folic acid (FOLVITE) 1 MG tablet Take 1 tablet (1 mg total) by mouth daily. 30 tablet 2  . gabapentin (NEURONTIN) 400 MG capsule Take 400 mg by mouth 3 (three) times daily.    . Insulin Glargine (LANTUS SOLOSTAR) 100 UNIT/ML Solostar Pen Inject 10 Units into the skin 2 (two) times daily.     [name] lisinopril-hydrochlorothiazide (ZESTORETIC) 20-12.5 MG tablet Take 1 tablet by mouth daily. 30 tablet 3  . metoprolol succinate (TOPROL-XL) 50 MG 24 hr tablet TAKE 1 TABLET BY MOUTH ONCE DAILY. 30 tablet 11   No current facility-administered medications for this visit.     Past Surgical History:  Procedure Laterality Date  . CARDIAC CATHETERIZATION    . NO PAST SURGERIES       No Known Allergies    Family History  Problem Relation Age of Onset  . Diabetes Father   . Hypertension Father      Social History [name] reports that he has been smoking cigarettes. He has a 15.00 pack-year smoking history. He has never used smokeless tobacco. [name] reports current  alcohol use of about 9.0 standard drinks of alcohol per week.   Review of Systems CONSTITUTIONAL: No weight loss, fever, chills, weakness or fatigue.  HEENT: Eyes: No visual loss, blurred vision, double vision or yellow sclerae.No hearing loss, sneezing, congestion, runny nose or sore throat.  SKIN: No rash or itching.  CARDIOVASCULAR: per hpi RESPIRATORY: No shortness of breath, cough or sputum.  GASTROINTESTINAL: No anorexia, nausea, vomiting or diarrhea. No abdominal pain or blood.  GENITOURINARY: No burning on urination, no polyuria NEUROLOGICAL: No headache, dizziness, syncope, paralysis, ataxia, numbness or tingling in the extremities. No change in bowel or bladder control.  MUSCULOSKELETAL: No muscle, back pain, joint pain or stiffness.  LYMPHATICS: No enlarged nodes. No history of  splenectomy.  PSYCHIATRIC: No history of depression or anxiety.  ENDOCRINOLOGIC: No reports of sweating, cold or heat intolerance. No polyuria or polydipsia.  [name]   Physical Examination Today's Vitals   10/16/20 0825  BP: 136/72  Pulse: 81  SpO2: 96%  Weight: 255 lb 3.2 oz (115.8 kg)  Height: 6\' 1"  (1.854 m)   Body mass index is 33.67 kg/m.  Gen: resting comfortably, no acute distress HEENT: no scleral icterus, pupils equal round and reactive, no palptable cervical adenopathy,  CV: RRR, no m/r/g, no jvd Resp: Clear to auscultation bilaterally GI: abdomen is soft, non-tender, non-distended, normal bowel sounds, no hepatosplenomegaly MSK: extremities are warm, no edema.  Skin: warm, no rash Neuro:  no focal deficits Psych: appropriate affect   Diagnostic Studies 07/2011 Echo LVEF 45-50%, mod biatrial enlargement, diastolic function not described.   05/2012 Carotid 06/2012 IMPRESSION: Bilateral atherosclerosis, not resulting in hemodynamically significant stenosis.  Jan 2016 ABI IMPRESSION: Normal ABIs at rest. Nonocclusive peripheral vascular disease, noncompressible in the left thigh and calf regions.    Assessment and Plan  1. CAD - denies any recent symptoms, continue current meds - he is on plavix by another provider, not for any cardiac issue  2. HTN - bp essentially at goal,continue current meds  3. Hyperlipidemia - request pcp labs, continue statin.      Feb 2016, M.D.

## 2020-11-27 ENCOUNTER — Other Ambulatory Visit: Payer: Self-pay | Admitting: Internal Medicine

## 2020-11-28 LAB — COMPLETE METABOLIC PANEL WITH GFR
AG Ratio: 1.3 (calc) (ref 1.0–2.5)
ALT: 14 U/L (ref 9–46)
AST: 17 U/L (ref 10–35)
Albumin: 4.3 g/dL (ref 3.6–5.1)
Alkaline phosphatase (APISO): 95 U/L (ref 35–144)
BUN/Creatinine Ratio: 21 (calc) (ref 6–22)
BUN: 28 mg/dL — ABNORMAL HIGH (ref 7–25)
CO2: 22 mmol/L (ref 20–32)
Calcium: 9.6 mg/dL (ref 8.6–10.3)
Chloride: 104 mmol/L (ref 98–110)
Creat: 1.36 mg/dL — ABNORMAL HIGH (ref 0.70–1.25)
GFR, Est African American: 64 mL/min/{1.73_m2} (ref >=60)
GFR, Est Non African American: 55 mL/min/{1.73_m2} — ABNORMAL LOW (ref >=60)
Globulin: 3.3 g/dL (calc) (ref 1.9–3.7)
Glucose, Bld: 83 mg/dL (ref 65–99)
Potassium: 5.7 mmol/L — ABNORMAL HIGH (ref 3.5–5.3)
Sodium: 139 mmol/L (ref 135–146)
Total Bilirubin: 0.4 mg/dL (ref 0.2–1.2)
Total Protein: 7.6 g/dL (ref 6.1–8.1)

## 2020-11-28 LAB — CBC
HCT: 43.3 % (ref 38.5–50.0)
Hemoglobin: 14.5 g/dL (ref 13.2–17.1)
MCH: 29.3 pg (ref 27.0–33.0)
MCHC: 33.5 g/dL (ref 32.0–36.0)
MCV: 87.5 fL (ref 80.0–100.0)
MPV: 9.6 fL (ref 7.5–12.5)
Platelets: 194 10*3/uL (ref 140–400)
RBC: 4.95 10*6/uL (ref 4.20–5.80)
RDW: 13.7 % (ref 11.0–15.0)
WBC: 4.7 10*3/uL (ref 3.8–10.8)

## 2020-11-28 LAB — LIPID PANEL
Cholesterol: 134 mg/dL (ref <200)
HDL: 47 mg/dL (ref >=40)
LDL Cholesterol (Calc): 67 mg/dL (calc)
Non-HDL Cholesterol (Calc): 87 mg/dL (calc) (ref <130)
Total CHOL/HDL Ratio: 2.9 (calc) (ref <5.0)
Triglycerides: 115 mg/dL (ref <150)

## 2020-11-28 LAB — PSA: PSA: 1.07 ng/mL (ref <=4.0)

## 2020-11-28 LAB — TSH: TSH: 0.72 mIU/L (ref 0.40–4.50)

## 2020-11-28 LAB — VITAMIN D 25 HYDROXY (VIT D DEFICIENCY, FRACTURES): Vit D, 25-Hydroxy: 26 ng/mL — ABNORMAL LOW (ref 30–100)

## 2021-01-05 ENCOUNTER — Other Ambulatory Visit: Payer: Self-pay | Admitting: Cardiology

## 2021-04-20 ENCOUNTER — Ambulatory Visit: Payer: Medicaid Other | Admitting: Cardiology

## 2021-06-08 ENCOUNTER — Other Ambulatory Visit: Payer: Self-pay | Admitting: Cardiology

## 2021-06-14 ENCOUNTER — Ambulatory Visit: Payer: Medicaid Other | Admitting: Cardiology

## 2021-06-23 ENCOUNTER — Other Ambulatory Visit: Payer: Self-pay

## 2021-06-23 ENCOUNTER — Ambulatory Visit (INDEPENDENT_AMBULATORY_CARE_PROVIDER_SITE_OTHER): Payer: Medicaid Other | Admitting: Cardiology

## 2021-06-23 ENCOUNTER — Encounter: Payer: Self-pay | Admitting: Cardiology

## 2021-06-23 VITALS — BP 128/72 | HR 72 | Ht 73.0 in | Wt 256.0 lb

## 2021-06-23 DIAGNOSIS — I25810 Atherosclerosis of coronary artery bypass graft(s) without angina pectoris: Secondary | ICD-10-CM

## 2021-06-23 DIAGNOSIS — E782 Mixed hyperlipidemia: Secondary | ICD-10-CM

## 2021-06-23 DIAGNOSIS — I10 Essential (primary) hypertension: Secondary | ICD-10-CM

## 2021-06-23 MED ORDER — AMLODIPINE BESYLATE 5 MG PO TABS: 5.0000 mg | ORAL_TABLET | Freq: Every day | ORAL | 3 refills | Status: DC

## 2021-06-23 NOTE — Patient Instructions (Addendum)
Medication Instructions:  Norvasc refilled today.  Continue all other medications.     Labwork: none  Testing/Procedures: none  Follow-Up: 6 months  Any Other Special Instructions Will Be Listed Below (If Applicable).    If you need a refill on your cardiac medications before your next appointment, please call your pharmacy.

## 2021-06-23 NOTE — Progress Notes (Signed)
Clinical Summary [name] is a 65 y.o.male seen today for follow up of the following medical problems.    1. 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   - echo 07/2011 LVEF 45-50% with inferior hypokinesis.     - no chest pains. No SOB/DOE - compliant with meds   2. HTN - ran out of norvasc       3. Hyperlipidemia -compliant with meds  Jan 22 TC 116 HDL 32 TG 190 LDL 67 - he is on atorvatatin 80mg     4. History of CVA - on aggrenox for secondary prevention, as well as statin. ACE-I stopped 04/2019 during admission with AKI - had bee no aggrenox, appears no longer on. He is on plavix, pill bottle had Dr Avbuere's name on it       5. EtOH use       Past Medical History:  Diagnosis Date   Coronary artery disease    Diabetes mellitus    Diastolic heart failure    Hyperlipidemia    Hypertension    Obesity    OSA on CPAP      No Known Allergies   Current Outpatient Medications  Medication Sig Dispense Refill   acetaminophen (TYLENOL) 500 MG tablet Take 1 tablet (500 mg total) by mouth every 6 (six) hours as needed. (Patient taking differently: Take 500 mg by mouth every 6 (six) hours as needed for mild pain or headache (pain in neck).) 30 tablet 0   amLODipine (NORVASC) 5 MG tablet Take 1 tablet (5 mg total) by mouth daily. 30 tablet 4   aspirin EC 81 MG tablet Take 1 tablet (81 mg total) by mouth daily. 90 tablet 3   atorvastatin (LIPITOR) 80 MG tablet Take 1 tablet (80 mg total) by mouth daily. 90 tablet 3   clopidogrel (PLAVIX) 75 MG tablet Take 75 mg by mouth daily.     Dulaglutide 1.5 MG/0.5ML SOPN Inject 1.5 mg into the skin once a week. On fridays     gabapentin (NEURONTIN) 400 MG capsule Take 400 mg by mouth 3 (three) times daily.     insulin glargine (LANTUS) 100 UNIT/ML Solostar Pen Inject 10 Units into the skin 2 (two) times daily.      INVOKANA 100 MG TABS tablet Take 100 mg by mouth daily.      lisinopril-hydrochlorothiazide (ZESTORETIC) 20-12.5 MG tablet TAKE 1 TABLET BY MOUTH ONCE DAILY. 90 tablet 3   metoprolol succinate (TOPROL-XL) 50 MG 24 hr tablet TAKE 1 TABLET BY MOUTH ONCE DAILY. 30 tablet 6   No current facility-administered medications for this visit.     Past Surgical History:  Procedure Laterality Date   CARDIAC CATHETERIZATION     NO PAST SURGERIES       No Known Allergies    Family History  Problem Relation Age of Onset   Diabetes Father    Hypertension Father      Social History Mr. Clayson reports that he has been smoking cigarettes. He has a 15.00 pack-year smoking history. He has never used smokeless tobacco. Mr. Hincapie reports current alcohol use of about 9.0 standard drinks per week.   Review of Systems CONSTITUTIONAL: No weight loss, fever, chills, weakness or fatigue.  HEENT: Eyes: No visual loss, blurred vision, double vision or yellow sclerae.No hearing loss, sneezing, congestion, runny nose or sore throat.  SKIN: No rash or itching.  CARDIOVASCULAR: per hpi RESPIRATORY: No shortness of breath,  cough or sputum.  GASTROINTESTINAL: No anorexia, nausea, vomiting or diarrhea. No abdominal pain or blood.  GENITOURINARY: No burning on urination, no polyuria NEUROLOGICAL: No headache, dizziness, syncope, paralysis, ataxia, numbness or tingling in the extremities. No change in bowel or bladder control.  MUSCULOSKELETAL: No muscle, back pain, joint pain or stiffness.  LYMPHATICS: No enlarged nodes. No history of splenectomy.  PSYCHIATRIC: No history of depression or anxiety.  ENDOCRINOLOGIC: No reports of sweating, cold or heat intolerance. No polyuria or polydipsia.  Marland Kitchen   Physical Examination There were no vitals filed for this visit. Filed Weights   06/23/21 1253  Weight: 256 lb (116.1 kg)    Gen: resting comfortably, no acute distress HEENT: no scleral icterus, pupils equal round and reactive, no palptable cervical adenopathy,  CV:  RRR, no m/r/g no jvd Resp: Clear to auscultation bilaterally GI: abdomen is soft, non-tender, non-distended, normal bowel sounds, no hepatosplenomegaly MSK: extremities are warm, no edema.  Skin: warm, no rash Neuro:  no focal deficits Psych: appropriate affect   Diagnostic Studies  07/2011 Echo LVEF 45-50%, mod biatrial enlargement, diastolic function not described.    05/2012 Carotid US  IMPRESSION: Bilateral atherosclerosis, not resulting in hemodynamically significant stenosis.   Jan 2016 ABI IMPRESSION: Normal ABIs at rest. Nonocclusive peripheral vascular disease, noncompressible in the left thigh and calf regions.   Assessment and Plan   1. CAD - he is on plavix by another provider, not for any cardiac issue - no recent symptoms, continue current meds  EKG shows SR, chronic RAD and RBBB   2. HTN - refill his norvac, continue current bp meds   3. Hyperlipidemia - LDL is at goal, continue atorvatatin       Antoine Poche, M.D.

## 2021-06-23 NOTE — Addendum Note (Signed)
Addended by: Lesle Chris on: 06/23/2021 01:30 PM   Modules accepted: Orders

## 2022-01-04 ENCOUNTER — Ambulatory Visit: Payer: Medicaid Other | Admitting: Cardiology

## 2022-01-14 ENCOUNTER — Ambulatory Visit (INDEPENDENT_AMBULATORY_CARE_PROVIDER_SITE_OTHER): Payer: Medicaid Other | Admitting: Cardiology

## 2022-01-14 ENCOUNTER — Encounter: Payer: Self-pay | Admitting: Cardiology

## 2022-01-14 ENCOUNTER — Other Ambulatory Visit: Payer: Self-pay

## 2022-01-14 VITALS — BP 114/70 | HR 72 | Ht 73.0 in | Wt 259.0 lb

## 2022-01-14 DIAGNOSIS — E782 Mixed hyperlipidemia: Secondary | ICD-10-CM

## 2022-01-14 DIAGNOSIS — I10 Essential (primary) hypertension: Secondary | ICD-10-CM

## 2022-01-14 DIAGNOSIS — I25810 Atherosclerosis of coronary artery bypass graft(s) without angina pectoris: Secondary | ICD-10-CM

## 2022-01-14 LAB — COLOGUARD: COLOGUARD: NEGATIVE

## 2022-01-14 NOTE — Progress Notes (Signed)
? ? ? ?Clinical Summary ?[name] is a 66 y.o.male seen today for follow up of the following medical problems.  ?  ?1. CAD ?- prior CABG 04/2008 (LIMA-LAD, SVG-ramus, SVG to LCX, SVG to RCA).   ?- echo 07/2011 LVEF 45-50% with inferior hypokinesis.   ?  ?- no recent chest pain. No SOB/DOE ?- compliant with meds ?  ?2. HTN ?- compliant with meds ?  ?  ?  ?3. Hyperlipidemia ?-compliant with meds ?  ?Jan 22 TC 116 HDL 32 TG 190 LDL 67 ?- he is on atorvatatin 80mg   ?- recent labs with pcp ?  ?4. History of CVA ? ?  ?  ?  ?5. EtOH use ?  ?  ?  ? ? ?Past Medical History:  ?Diagnosis Date  ? Coronary artery disease   ? Diabetes mellitus   ? Diastolic heart failure   ? Hyperlipidemia   ? Hypertension   ? Obesity   ? OSA on CPAP   ? ? ? ?No Known Allergies ? ? ?Current Outpatient Medications  ?Medication Sig Dispense Refill  ? acetaminophen (TYLENOL) 500 MG tablet Take 1 tablet (500 mg total) by mouth every 6 (six) hours as needed. (Patient taking differently: Take 500 mg by mouth every 6 (six) hours as needed for mild pain or headache (pain in neck).) 30 tablet 0  ? amLODipine (NORVASC) 5 MG tablet Take 1 tablet (5 mg total) by mouth daily. 90 tablet 3  ? aspirin EC 81 MG tablet Take 1 tablet (81 mg total) by mouth daily. 90 tablet 3  ? atorvastatin (LIPITOR) 80 MG tablet Take 1 tablet (80 mg total) by mouth daily. 90 tablet 3  ? clopidogrel (PLAVIX) 75 MG tablet Take 75 mg by mouth daily.    ? Dulaglutide 1.5 MG/0.5ML SOPN Inject 1.5 mg into the skin once a week. On fridays    ? fluticasone (FLONASE) 50 MCG/ACT nasal spray Place 2 sprays into both nostrils daily.    ? gabapentin (NEURONTIN) 400 MG capsule Take 400 mg by mouth 3 (three) times daily.    ? insulin glargine (LANTUS) 100 UNIT/ML Solostar Pen Inject 10 Units into the skin 2 (two) times daily.     ? INVOKANA 100 MG TABS tablet Take 100 mg by mouth daily.    ? lisinopril-hydrochlorothiazide (ZESTORETIC) 20-12.5 MG tablet TAKE 1 TABLET BY MOUTH ONCE DAILY. 90  tablet 3  ? metoprolol succinate (TOPROL-XL) 50 MG 24 hr tablet TAKE 1 TABLET BY MOUTH ONCE DAILY. 30 tablet 6  ? SYMBICORT 80-4.5 MCG/ACT inhaler Inhale 1 puff into the lungs daily.    ? tiZANidine (ZANAFLEX) 4 MG tablet Take 4 mg by mouth daily at 12 noon.    ? ?No current facility-administered medications for this visit.  ? ? ? ?Past Surgical History:  ?Procedure Laterality Date  ? CARDIAC CATHETERIZATION    ? NO PAST SURGERIES    ? ? ? ?No Known Allergies ? ? ? ?Family History  ?Problem Relation Age of Onset  ? Diabetes Father   ? Hypertension Father   ? ? ? ?Social History ?Mr. Cloe reports that he has been smoking cigarettes. He has a 15.00 pack-year smoking history. He has never used smokeless tobacco. ?Mr. Beske reports current alcohol use of about 9.0 standard drinks per week. ? ? ?Review of Systems ?CONSTITUTIONAL: No weight loss, fever, chills, weakness or fatigue.  ?HEENT: Eyes: No visual loss, blurred vision, double vision or yellow sclerae.No hearing loss, sneezing,  congestion, runny nose or sore throat.  ?SKIN: No rash or itching.  ?CARDIOVASCULAR: per hpi ?RESPIRATORY: No shortness of breath, cough or sputum.  ?GASTROINTESTINAL: No anorexia, nausea, vomiting or diarrhea. No abdominal pain or blood.  ?GENITOURINARY: No burning on urination, no polyuria ?NEUROLOGICAL: No headache, dizziness, syncope, paralysis, ataxia, numbness or tingling in the extremities. No change in bowel or bladder control.  ?MUSCULOSKELETAL: No muscle, back pain, joint pain or stiffness.  ?LYMPHATICS: No enlarged nodes. No history of splenectomy.  ?PSYCHIATRIC: No history of depression or anxiety.  ?ENDOCRINOLOGIC: No reports of sweating, cold or heat intolerance. No polyuria or polydipsia.  ?. ? ? ?Physical Examination ?Today's Vitals  ? 01/14/22 1025  ?BP: 114/70  ?Pulse: 72  ?SpO2: 98%  ?Weight: 259 lb (117.5 kg)  ?Height: 6\' 1"  (1.854 m)  ? ?Body mass index is 34.17 kg/m?. ? ?Gen: resting comfortably, no acute  distress ?HEENT: no scleral icterus, pupils equal round and reactive, no palptable cervical adenopathy,  ?CV: RRR, no m/r/g no jvd ?Resp: Clear to auscultation bilaterally ?GI: abdomen is soft, non-tender, non-distended, normal bowel sounds, no hepatosplenomegaly ?MSK: extremities are warm, no edema.  ?Skin: warm, no rash ?Neuro:  no focal deficits ?Psych: appropriate affect ? ? ?Diagnostic Studies ? ?07/2011 Echo ?LVEF 45-50%, mod biatrial enlargement, diastolic function not described.  ?  ?05/2012 Carotid 06/2012 ? IMPRESSION: ?Bilateral atherosclerosis, not resulting in hemodynamically ?significant stenosis. ?  ?Jan 2016 ABI ?IMPRESSION: ?Normal ABIs at rest. Nonocclusive peripheral vascular disease, ?noncompressible in the left thigh and calf regions. ? ? ?Assessment and Plan  ? ?1. CAD ?- he is on plavix by another provider, not for any cardiac issue ?- no recent symptoms, continue current meds ?  ?  ?2. HTN ?- at goal, continue current meds ?  ?3. Hyperlipidemia ?- request pcp labs, continue statin. Would aim for LDL <55 ? ? ?Feb 2016, M.D. ?

## 2022-01-14 NOTE — Patient Instructions (Signed)
Follow-Up: °Follow up with Dr. Branch in 6 months ° °Any Other Special Instructions Will Be Listed Below (If Applicable). ° ° ° ° °If you need a refill on your cardiac medications before your next appointment, please call your pharmacy. ° °

## 2022-02-04 ENCOUNTER — Other Ambulatory Visit: Payer: Self-pay | Admitting: Cardiology

## 2022-03-07 ENCOUNTER — Other Ambulatory Visit: Payer: Self-pay | Admitting: Cardiology

## 2022-07-28 ENCOUNTER — Ambulatory Visit: Payer: Medicaid Other | Admitting: Cardiology

## 2022-08-08 ENCOUNTER — Telehealth: Payer: Self-pay | Admitting: Cardiology

## 2022-08-08 NOTE — Telephone Encounter (Signed)
Patient states he is returning a call from someone today. I did not see any notes.

## 2022-08-08 NOTE — Telephone Encounter (Signed)
Patient was contacted by [facility] for an opening at the [employer] [facility]. Pt would be unable to make appointment as he rides with [employer] and had to notifiy them 3 days in advance for transportation.

## 2022-11-02 NOTE — Progress Notes (Signed)
Cardiology Office Note:    Date:  11/14/2022   ID:  [name], DOB [birth year], MRN [id number]  PCP:  Fleet Contras, MD  [facility] Providers Cardiologist:  Dina Rich, MD     Referring MD: Fleet Contras, MD   Chief Complaint:  Follow-up     History of Present Illness:   [name] is a 66 y.o. male with  history of CAD CABG 04/2008 (LIMA-LAD, SVG-ramus, SVG to LCX, SVG to RCA).  echo 07/2011 LVEF 45-50% with inferior hypokinesis. HTN, HLD, history of CVA. Last saw Dr. Wyline Mood 01/2022 and doing well.      Patient comes in for yearly f/u. Denies chest pain, dyspnea, edema, palpitations. No regular exercise. Watches TV and works on cars. Smokes 1ppd. Says he can't quit. He drinks 1-2 quarts of beer daily and drinks a pint of liquor on the weekend.       Past Medical History:  Diagnosis Date   Coronary artery disease    Diabetes mellitus    Diastolic heart failure    Hyperlipidemia    Hypertension    Obesity    OSA on CPAP    Current Medications: Current Meds  Medication Sig   acetaminophen (TYLENOL) 500 MG tablet Take 1 tablet (500 mg total) by mouth every 6 (six) hours as needed. (Patient taking differently: Take 500 mg by mouth every 6 (six) hours as needed for mild pain or headache (pain in neck).)   amLODipine (NORVASC) 5 MG tablet TAKE 1 TABLET BY MOUTH ONCE A DAY.   aspirin EC 81 MG tablet Take 1 tablet (81 mg total) by mouth daily.   atorvastatin (LIPITOR) 80 MG tablet Take 1 tablet (80 mg total) by mouth daily.   clopidogrel (PLAVIX) 75 MG tablet Take 75 mg by mouth daily.   Dulaglutide 1.5 MG/0.5ML SOPN Inject 1.5 mg into the skin once a week. On fridays   fluticasone (FLONASE) 50 MCG/ACT nasal spray Place 2 sprays into both nostrils daily.   gabapentin (NEURONTIN) 400 MG capsule Take 400 mg by mouth 3 (three) times daily.   insulin glargine (LANTUS) 100 UNIT/ML Solostar Pen Inject 10 Units into the skin 2 (two) times daily.     INVOKANA 100 MG TABS tablet Take 100 mg by mouth daily.   lisinopril-hydrochlorothiazide (ZESTORETIC) 20-12.5 MG tablet TAKE 1 TABLET BY MOUTH ONCE DAILY.   metoprolol succinate (TOPROL-XL) 50 MG 24 hr tablet TAKE 1 TABLET BY MOUTH ONCE DAILY.   SYMBICORT 80-4.5 MCG/ACT inhaler Inhale 1 puff into the lungs daily.   tiZANidine (ZANAFLEX) 4 MG tablet Take 4 mg by mouth daily at 12 noon.    Allergies:   Patient has no known allergies.   Social History   Tobacco Use   Smoking status: Every Day    Packs/day: 1.00    Years: 15.00    Total pack years: 15.00    Types: Cigarettes   Smokeless tobacco: Never   Tobacco comments:    Down to a cigarette per week  Vaping Use   Vaping Use: Never used  Substance Use Topics   Alcohol use: Yes    Alcohol/week: 9.0 standard drinks of alcohol    Types: 2 Shots of liquor, 7 Cans of beer per week    Comment: occ   Drug use: No    Family Hx: The patient's family history includes Diabetes in his father; Hypertension in his father.  ROS     Physical Exam:    VS:  BP 128/78   Pulse 72   Ht 6\' 1"  (1.854 m)   Wt 240 lb (108.9 kg)   SpO2 100%   BMI 31.66 kg/m     Wt Readings from Last 3 Encounters:  11/14/22 240 lb (108.9 kg)  01/14/22 259 lb (117.5 kg)  06/23/21 256 lb (116.1 kg)    Physical Exam  GEN: Obese, in no acute distress  Neck: no JVD, carotid bruits, or masses Cardiac:RRR; no murmurs, rubs, or gallops  Respiratory:  decreased breath sounds but clear to auscultation bilaterally, normal work of breathing GI: soft, nontender, nondistended, + BS Ext: without cyanosis, clubbing, or edema, Good distal pulses bilaterally Neuro:  Alert and Oriented x 3, Psych: euthymic mood, full affect        EKGs/Labs/Other Test Reviewed:    EKG:  EKG is   ordered today.  The ekg ordered today demonstrates NSR with RBBB, LVH old inflat MI artifact, no change  Recent Labs: No results found for requested labs within last 365 days.   Recent  Lipid Panel No results for input(s): "CHOL", "TRIG", "HDL", "VLDL", "LDLCALC", "LDLDIRECT" in the last 8760 hours.   Prior CV Studies:   07/2011 Echo LVEF 45-50%, mod biatrial enlargement, diastolic function not described.    05/2012 Carotid 06/2012  IMPRESSION: Bilateral atherosclerosis, not resulting in hemodynamically significant stenosis.   Jan 2016 ABI IMPRESSION: Normal ABIs at rest. Nonocclusive peripheral vascular disease, noncompressible in the left thigh and calf regions.   Risk Assessment/Calculations/Metrics:              ASSESSMENT & PLAN:   No problem-specific Assessment & Plan notes found for this encounter.    CAD CABG 04/2008 (LIMA-LAD, SVG-ramus, SVG to LCX, SVG to RCA).   - echo 07/2011 LVEF 45-50% with inferior hypokinesis.   - he is on plavix by PCP for prior CVA, not for any cardiac issue - no recent symptoms, continue current meds -150 min exercise weekly      HTN - at goal, continue current meds    Hyperlipidemia - scheduled for labs at PCP 11/28/22, continue statin. Would aim for LDL <55  Tobacco abuse-continues to smoke 1 ppd and says he can't quit  ETOH-drinks daily and more on weekends. I've asked him to cut back. He agrees to try.               Dispo:  No follow-ups on file.   Medication Adjustments/Labs and Tests Ordered: Current medicines are reviewed at length with the patient today.  Concerns regarding medicines are outlined above.  Tests Ordered: No orders of the defined types were placed in this encounter.  Medication Changes: No orders of the defined types were placed in this encounter.  Signed, 11/30/22, PA-C  11/14/2022 12:48 PM    [facility] [address] [place], [place], [place]  [place] Phone: [phone]; Fax: [phone]

## 2022-11-14 ENCOUNTER — Ambulatory Visit: Payer: Medicaid Other | Attending: Physician Assistant | Admitting: Physician Assistant

## 2022-11-14 ENCOUNTER — Encounter: Payer: Self-pay | Admitting: Physician Assistant

## 2022-11-14 VITALS — BP 128/78 | HR 72 | Ht 73.0 in | Wt 240.0 lb

## 2022-11-14 DIAGNOSIS — F1010 Alcohol abuse, uncomplicated: Secondary | ICD-10-CM

## 2022-11-14 DIAGNOSIS — I25810 Atherosclerosis of coronary artery bypass graft(s) without angina pectoris: Secondary | ICD-10-CM

## 2022-11-14 DIAGNOSIS — I10 Essential (primary) hypertension: Secondary | ICD-10-CM | POA: Diagnosis not present

## 2022-11-14 DIAGNOSIS — E782 Mixed hyperlipidemia: Secondary | ICD-10-CM

## 2022-11-14 DIAGNOSIS — Z720 Tobacco use: Secondary | ICD-10-CM

## 2022-11-14 NOTE — Addendum Note (Signed)
Addended by: Channon Ambrosini G on: 11/14/2022 04:50 PM   Modules accepted: Orders  

## 2022-11-14 NOTE — Patient Instructions (Signed)
Medication Instructions:  Your physician recommends that you continue on your current medications as directed. Please refer to the Current Medication list given to you today.  *If you need a refill on your cardiac medications before your next appointment, please call your pharmacy*   Lab Work: NONE   If you have labs (blood work) drawn today and your tests are completely normal, you will receive your results only by: [facility] (if you have MyChart) OR A paper copy in the mail If you have any lab test that is abnormal or we need to change your treatment, we will call you to review the results.   Testing/Procedures: NONE    Follow-Up: At [facility], you and your health needs are our priority.  As part of our continuing mission to provide you with exceptional heart care, we have created designated Provider Care Teams.  These Care Teams include your primary Cardiologist (physician) and Advanced Practice Providers (APPs -  Physician Assistants and Nurse Practitioners) who all work together to provide you with the care you need, when you need it.  We recommend signing up for the patient portal called "MyChart".  Sign up information is provided on this After Visit Summary.  MyChart is used to connect with patients for Virtual Visits (Telemedicine).  Patients are able to view lab/test results, encounter notes, upcoming appointments, etc.  Non-urgent messages can be sent to your provider as well.   To learn more about what you can do with MyChart, go to [email or web address].    Your next appointment:   1 year(s)  The format for your next appointment:   In Person  Provider:   Carlyle Dolly, MD    Other Instructions Decrease you alcohol and smoking  Complete 150 minutes of exercise weekly    Important Information About Sugar

## 2022-11-28 ENCOUNTER — Other Ambulatory Visit: Payer: Self-pay | Admitting: Internal Medicine

## 2022-11-29 LAB — LIPID PANEL
Cholesterol: 130 mg/dL (ref <200)
HDL: 58 mg/dL (ref >=40)
LDL Cholesterol (Calc): 53 mg/dL (calc)
Non-HDL Cholesterol (Calc): 72 mg/dL (calc) (ref <130)
Total CHOL/HDL Ratio: 2.2 (calc) (ref <5.0)
Triglycerides: 106 mg/dL (ref <150)

## 2022-11-29 LAB — COMPLETE METABOLIC PANEL WITH GFR
AG Ratio: 1.3 (calc) (ref 1.0–2.5)
ALT: 10 U/L (ref 9–46)
AST: 13 U/L (ref 10–35)
Albumin: 4.3 g/dL (ref 3.6–5.1)
Alkaline phosphatase (APISO): 76 U/L (ref 35–144)
BUN/Creatinine Ratio: 23 (calc) — ABNORMAL HIGH (ref 6–22)
BUN: 39 mg/dL — ABNORMAL HIGH (ref 7–25)
CO2: 21 mmol/L (ref 20–32)
Calcium: 9.7 mg/dL (ref 8.6–10.3)
Chloride: 108 mmol/L (ref 98–110)
Creat: 1.66 mg/dL — ABNORMAL HIGH (ref 0.70–1.35)
Globulin: 3.3 g/dL (calc) (ref 1.9–3.7)
Glucose, Bld: 76 mg/dL (ref 65–99)
Potassium: 5 mmol/L (ref 3.5–5.3)
Sodium: 140 mmol/L (ref 135–146)
Total Bilirubin: 0.5 mg/dL (ref 0.2–1.2)
Total Protein: 7.6 g/dL (ref 6.1–8.1)
eGFR: 46 mL/min/{1.73_m2} — ABNORMAL LOW (ref >=60)

## 2022-11-29 LAB — CBC
HCT: 42 % (ref 38.5–50.0)
Hemoglobin: 14.2 g/dL (ref 13.2–17.1)
MCH: 29.3 pg (ref 27.0–33.0)
MCHC: 33.8 g/dL (ref 32.0–36.0)
MCV: 86.6 fL (ref 80.0–100.0)
MPV: 10.2 fL (ref 7.5–12.5)
Platelets: 181 10*3/uL (ref 140–400)
RBC: 4.85 10*6/uL (ref 4.20–5.80)
RDW: 13.4 % (ref 11.0–15.0)
WBC: 6 10*3/uL (ref 3.8–10.8)

## 2022-11-29 LAB — TSH: TSH: 0.62 mIU/L (ref 0.40–4.50)

## 2022-11-29 LAB — PSA: PSA: 1.16 ng/mL (ref <=4.00)

## 2022-11-29 LAB — VITAMIN D 25 HYDROXY (VIT D DEFICIENCY, FRACTURES): Vit D, 25-Hydroxy: 21 ng/mL — ABNORMAL LOW (ref 30–100)

## 2022-11-29 LAB — URIC ACID: Uric Acid, Serum: 7.8 mg/dL (ref 4.0–8.0)

## 2022-12-09 ENCOUNTER — Other Ambulatory Visit: Payer: Self-pay | Admitting: Cardiology

## 2023-03-01 ENCOUNTER — Other Ambulatory Visit: Payer: Self-pay | Admitting: Internal Medicine

## 2023-03-02 LAB — BASIC METABOLIC PANEL WITH GFR
BUN/Creatinine Ratio: 24 (calc) — ABNORMAL HIGH (ref 6–22)
BUN: 37 mg/dL — ABNORMAL HIGH (ref 7–25)
CO2: 25 mmol/L (ref 20–32)
Calcium: 9.8 mg/dL (ref 8.6–10.3)
Chloride: 105 mmol/L (ref 98–110)
Creat: 1.55 mg/dL — ABNORMAL HIGH (ref 0.70–1.35)
Glucose, Bld: 69 mg/dL (ref 65–99)
Potassium: 5.4 mmol/L — ABNORMAL HIGH (ref 3.5–5.3)
Sodium: 140 mmol/L (ref 135–146)
eGFR: 50 mL/min/{1.73_m2} — ABNORMAL LOW (ref >=60)

## 2023-04-11 ENCOUNTER — Other Ambulatory Visit: Payer: Self-pay | Admitting: Cardiology

## 2023-07-06 ENCOUNTER — Other Ambulatory Visit: Payer: Self-pay | Admitting: Cardiology

## 2023-07-07 ENCOUNTER — Other Ambulatory Visit: Payer: Self-pay | Admitting: Cardiology

## 2023-07-25 ENCOUNTER — Ambulatory Visit: Payer: Medicare Other | Admitting: Podiatry

## 2023-07-26 ENCOUNTER — Ambulatory Visit (INDEPENDENT_AMBULATORY_CARE_PROVIDER_SITE_OTHER): Payer: Medicare Other | Admitting: Podiatry

## 2023-07-26 DIAGNOSIS — Z91199 Patient's noncompliance with other medical treatment and regimen due to unspecified reason: Secondary | ICD-10-CM

## 2023-07-26 NOTE — Progress Notes (Signed)
No show

## 2023-10-27 ENCOUNTER — Emergency Department (HOSPITAL_COMMUNITY): Payer: Medicare Other

## 2023-10-27 ENCOUNTER — Ambulatory Visit
Admission: EM | Admit: 2023-10-27 | Discharge: 2023-10-27 | Disposition: A | Discharge status: Home / Self Care | Payer: Medicare Other | Attending: Nurse Practitioner | Admitting: Nurse Practitioner

## 2023-10-27 ENCOUNTER — Observation Stay (HOSPITAL_COMMUNITY): Payer: Medicare Other

## 2023-10-27 ENCOUNTER — Other Ambulatory Visit: Payer: Self-pay

## 2023-10-27 ENCOUNTER — Observation Stay (HOSPITAL_COMMUNITY)
Admission: EM | Admit: 2023-10-27 | Discharge: 2023-10-28 | Disposition: A | Discharge status: Home / Self Care | Payer: Medicare Other | Attending: Internal Medicine | Admitting: Internal Medicine

## 2023-10-27 ENCOUNTER — Encounter (HOSPITAL_COMMUNITY): Payer: Self-pay

## 2023-10-27 ENCOUNTER — Other Ambulatory Visit (HOSPITAL_COMMUNITY): Payer: Self-pay

## 2023-10-27 ENCOUNTER — Telehealth (HOSPITAL_COMMUNITY): Payer: Self-pay

## 2023-10-27 DIAGNOSIS — I2510 Atherosclerotic heart disease of native coronary artery without angina pectoris: Secondary | ICD-10-CM | POA: Diagnosis not present

## 2023-10-27 DIAGNOSIS — I110 Hypertensive heart disease with heart failure: Secondary | ICD-10-CM | POA: Insufficient documentation

## 2023-10-27 DIAGNOSIS — F17210 Nicotine dependence, cigarettes, uncomplicated: Secondary | ICD-10-CM | POA: Insufficient documentation

## 2023-10-27 DIAGNOSIS — I6523 Occlusion and stenosis of bilateral carotid arteries: Secondary | ICD-10-CM | POA: Diagnosis not present

## 2023-10-27 DIAGNOSIS — I4891 Unspecified atrial fibrillation: Secondary | ICD-10-CM | POA: Diagnosis not present

## 2023-10-27 DIAGNOSIS — R42 Dizziness and giddiness: Secondary | ICD-10-CM | POA: Diagnosis not present

## 2023-10-27 DIAGNOSIS — N182 Chronic kidney disease, stage 2 (mild): Secondary | ICD-10-CM | POA: Diagnosis not present

## 2023-10-27 DIAGNOSIS — E669 Obesity, unspecified: Secondary | ICD-10-CM | POA: Insufficient documentation

## 2023-10-27 DIAGNOSIS — E875 Hyperkalemia: Principal | ICD-10-CM | POA: Insufficient documentation

## 2023-10-27 DIAGNOSIS — E1122 Type 2 diabetes mellitus with diabetic chronic kidney disease: Secondary | ICD-10-CM | POA: Diagnosis not present

## 2023-10-27 DIAGNOSIS — Z7985 Long-term (current) use of injectable non-insulin antidiabetic drugs: Secondary | ICD-10-CM | POA: Insufficient documentation

## 2023-10-27 DIAGNOSIS — J449 Chronic obstructive pulmonary disease, unspecified: Secondary | ICD-10-CM | POA: Insufficient documentation

## 2023-10-27 DIAGNOSIS — Z8679 Personal history of other diseases of the circulatory system: Secondary | ICD-10-CM | POA: Diagnosis not present

## 2023-10-27 DIAGNOSIS — Z8673 Personal history of transient ischemic attack (TIA), and cerebral infarction without residual deficits: Secondary | ICD-10-CM | POA: Insufficient documentation

## 2023-10-27 DIAGNOSIS — Z7902 Long term (current) use of antithrombotics/antiplatelets: Secondary | ICD-10-CM | POA: Diagnosis not present

## 2023-10-27 DIAGNOSIS — Z6831 Body mass index (BMI) 31.0-31.9, adult: Secondary | ICD-10-CM | POA: Insufficient documentation

## 2023-10-27 DIAGNOSIS — I63412 Cerebral infarction due to embolism of left middle cerebral artery: Secondary | ICD-10-CM

## 2023-10-27 DIAGNOSIS — R443 Hallucinations, unspecified: Secondary | ICD-10-CM | POA: Diagnosis present

## 2023-10-27 DIAGNOSIS — I5030 Unspecified diastolic (congestive) heart failure: Secondary | ICD-10-CM | POA: Insufficient documentation

## 2023-10-27 DIAGNOSIS — Z794 Long term (current) use of insulin: Secondary | ICD-10-CM | POA: Insufficient documentation

## 2023-10-27 DIAGNOSIS — Z7982 Long term (current) use of aspirin: Secondary | ICD-10-CM | POA: Insufficient documentation

## 2023-10-27 DIAGNOSIS — Z79899 Other long term (current) drug therapy: Secondary | ICD-10-CM | POA: Insufficient documentation

## 2023-10-27 DIAGNOSIS — Z7951 Long term (current) use of inhaled steroids: Secondary | ICD-10-CM | POA: Diagnosis not present

## 2023-10-27 DIAGNOSIS — I130 Hypertensive heart and chronic kidney disease with heart failure and stage 1 through stage 4 chronic kidney disease, or unspecified chronic kidney disease: Secondary | ICD-10-CM | POA: Diagnosis not present

## 2023-10-27 DIAGNOSIS — E1140 Type 2 diabetes mellitus with diabetic neuropathy, unspecified: Secondary | ICD-10-CM | POA: Insufficient documentation

## 2023-10-27 DIAGNOSIS — R410 Disorientation, unspecified: Secondary | ICD-10-CM | POA: Diagnosis not present

## 2023-10-27 LAB — COMPREHENSIVE METABOLIC PANEL
ALT: 18 U/L (ref 0–44)
AST: 19 U/L (ref 15–41)
Albumin: 4.2 g/dL (ref 3.5–5.0)
Alkaline Phosphatase: 69 U/L (ref 38–126)
Anion gap: 8 (ref 5–15)
BUN: 31 mg/dL — ABNORMAL HIGH (ref 8–23)
CO2: 28 mmol/L (ref 22–32)
Calcium: 9.7 mg/dL (ref 8.9–10.3)
Chloride: 101 mmol/L (ref 98–111)
Creatinine, Ser: 1.4 mg/dL — ABNORMAL HIGH (ref 0.61–1.24)
GFR, Estimated: 56 mL/min — ABNORMAL LOW (ref >60)
Glucose, Bld: 130 mg/dL — ABNORMAL HIGH (ref 70–99)
Potassium: 6.2 mmol/L — ABNORMAL HIGH (ref 3.5–5.1)
Sodium: 137 mmol/L (ref 135–145)
Total Bilirubin: 0.4 mg/dL (ref <1.2)
Total Protein: 8 g/dL (ref 6.5–8.1)

## 2023-10-27 LAB — CBC WITH DIFFERENTIAL/PLATELET
Abs Immature Granulocytes: 0.02 10*3/uL (ref 0.00–0.07)
Basophils Absolute: 0 10*3/uL (ref 0.0–0.1)
Basophils Relative: 0 %
Eosinophils Absolute: 0.1 10*3/uL (ref 0.0–0.5)
Eosinophils Relative: 1 %
HCT: 46.5 % (ref 39.0–52.0)
Hemoglobin: 15 g/dL (ref 13.0–17.0)
Immature Granulocytes: 0 %
Lymphocytes Relative: 40 %
Lymphs Abs: 2 10*3/uL (ref 0.7–4.0)
MCH: 29.5 pg (ref 26.0–34.0)
MCHC: 32.3 g/dL (ref 30.0–36.0)
MCV: 91.4 fL (ref 80.0–100.0)
Monocytes Absolute: 0.5 10*3/uL (ref 0.1–1.0)
Monocytes Relative: 10 %
Neutro Abs: 2.5 10*3/uL (ref 1.7–7.7)
Neutrophils Relative %: 49 %
Platelets: 156 10*3/uL (ref 150–400)
RBC: 5.09 MIL/uL (ref 4.22–5.81)
RDW: 14.4 % (ref 11.5–15.5)
WBC: 5 10*3/uL (ref 4.0–10.5)
nRBC: 0 % (ref 0.0–0.2)

## 2023-10-27 LAB — VITAMIN B12: Vitamin B-12: 190 pg/mL (ref 180–914)

## 2023-10-27 LAB — TSH: TSH: 0.97 u[IU]/mL (ref 0.350–4.500)

## 2023-10-27 LAB — CBG MONITORING, ED: Glucose-Capillary: 111 mg/dL — ABNORMAL HIGH (ref 70–99)

## 2023-10-27 LAB — POCT FASTING CBG KUC MANUAL ENTRY: POCT Glucose (KUC): 118 mg/dL — AB (ref 70–99)

## 2023-10-27 LAB — GLUCOSE, CAPILLARY: Glucose-Capillary: 92 mg/dL (ref 70–99)

## 2023-10-27 LAB — MAGNESIUM: Magnesium: 2.5 mg/dL — ABNORMAL HIGH (ref 1.7–2.4)

## 2023-10-27 LAB — SALICYLATE LEVEL: Salicylate Lvl: <7 mg/dL — ABNORMAL LOW (ref 7.0–30.0)

## 2023-10-27 LAB — ETHANOL: Alcohol, Ethyl (B): <10 mg/dL (ref <10)

## 2023-10-27 MED ORDER — GABAPENTIN 400 MG PO CAPS
400.0000 mg | ORAL_CAPSULE | Freq: Three times a day (TID) | ORAL | Status: DC
  Administered 2023-10-27 – 2023-10-28 (×2): 400 mg via ORAL
  Filled 2023-10-27 (×2): qty 1

## 2023-10-27 MED ORDER — STROKE: EARLY STAGES OF RECOVERY BOOK: Freq: Once | Status: DC

## 2023-10-27 MED ORDER — DEXTROSE 50 % IV SOLN
1.0000 | Freq: Once | INTRAVENOUS | Status: AC
  Administered 2023-10-27: 50 mL via INTRAVENOUS
  Filled 2023-10-27: qty 50

## 2023-10-27 MED ORDER — CALCIUM GLUCONATE-NACL 1-0.675 GM/50ML-% IV SOLN
1.0000 g | Freq: Once | INTRAVENOUS | Status: AC
  Administered 2023-10-27: 1000 mg via INTRAVENOUS
  Filled 2023-10-27: qty 50

## 2023-10-27 MED ORDER — FOLIC ACID 1 MG PO TABS
1.0000 mg | ORAL_TABLET | Freq: Every day | ORAL | Status: DC
  Administered 2023-10-27 – 2023-10-28 (×2): 1 mg via ORAL
  Filled 2023-10-27 (×2): qty 1

## 2023-10-27 MED ORDER — ACETAMINOPHEN 325 MG PO TABS: 650.0000 mg | ORAL_TABLET | Freq: Four times a day (QID) | ORAL | Status: DC | PRN

## 2023-10-27 MED ORDER — INSULIN ASPART 100 UNIT/ML IV SOLN
5.0000 [IU] | Freq: Once | INTRAVENOUS | Status: AC
  Administered 2023-10-27: 5 [IU] via INTRAVENOUS

## 2023-10-27 MED ORDER — APIXABAN 5 MG PO TABS
5.0000 mg | ORAL_TABLET | Freq: Two times a day (BID) | ORAL | Status: DC
  Administered 2023-10-27: 5 mg via ORAL
  Filled 2023-10-27: qty 1

## 2023-10-27 MED ORDER — ONDANSETRON HCL 4 MG/2ML IJ SOLN: 4.0000 mg | Freq: Four times a day (QID) | INTRAMUSCULAR | Status: DC | PRN

## 2023-10-27 MED ORDER — THIAMINE MONONITRATE 100 MG PO TABS
100.0000 mg | ORAL_TABLET | Freq: Every day | ORAL | Status: DC
  Administered 2023-10-27 – 2023-10-28 (×2): 100 mg via ORAL
  Filled 2023-10-27 (×2): qty 1

## 2023-10-27 MED ORDER — ATORVASTATIN CALCIUM 40 MG PO TABS
80.0000 mg | ORAL_TABLET | Freq: Every day | ORAL | Status: DC
  Administered 2023-10-28: 80 mg via ORAL
  Filled 2023-10-27: qty 2

## 2023-10-27 MED ORDER — NICOTINE 21 MG/24HR TD PT24
21.0000 mg | MEDICATED_PATCH | Freq: Every day | TRANSDERMAL | Status: DC
  Administered 2023-10-27 – 2023-10-28 (×2): 21 mg via TRANSDERMAL
  Filled 2023-10-27 (×2): qty 1

## 2023-10-27 MED ORDER — MECLIZINE HCL 12.5 MG PO TABS: 25.0000 mg | ORAL_TABLET | Freq: Three times a day (TID) | ORAL | Status: DC | PRN

## 2023-10-27 MED ORDER — FLUTICASONE FUROATE-VILANTEROL 100-25 MCG/ACT IN AEPB
1.0000 | INHALATION_SPRAY | Freq: Every day | RESPIRATORY_TRACT | Status: DC
  Administered 2023-10-28: 1 via RESPIRATORY_TRACT
  Filled 2023-10-27: qty 28

## 2023-10-27 MED ORDER — FUROSEMIDE 10 MG/ML IJ SOLN
40.0000 mg | Freq: Once | INTRAMUSCULAR | Status: AC
  Administered 2023-10-27: 40 mg via INTRAVENOUS
  Filled 2023-10-27: qty 4

## 2023-10-27 MED ORDER — HEPARIN SODIUM (PORCINE) 5000 UNIT/ML IJ SOLN
5000.0000 [IU] | Freq: Three times a day (TID) | INTRAMUSCULAR | Status: DC
  Administered 2023-10-27 – 2023-10-28 (×2): 5000 [IU] via SUBCUTANEOUS
  Filled 2023-10-27 (×3): qty 1

## 2023-10-27 MED ORDER — ASPIRIN 325 MG PO TABS
325.0000 mg | ORAL_TABLET | Freq: Every day | ORAL | Status: DC
  Administered 2023-10-27 – 2023-10-28 (×2): 325 mg via ORAL
  Filled 2023-10-27 (×2): qty 1

## 2023-10-27 MED ORDER — ONDANSETRON HCL 4 MG PO TABS: 4.0000 mg | ORAL_TABLET | Freq: Four times a day (QID) | ORAL | Status: DC | PRN

## 2023-10-27 MED ORDER — METOPROLOL SUCCINATE ER 50 MG PO TB24
50.0000 mg | ORAL_TABLET | Freq: Every day | ORAL | Status: DC
  Administered 2023-10-27 – 2023-10-28 (×2): 50 mg via ORAL
  Filled 2023-10-27 (×2): qty 1

## 2023-10-27 MED ORDER — ACETAMINOPHEN 650 MG RE SUPP: 650.0000 mg | Freq: Four times a day (QID) | RECTAL | Status: DC | PRN

## 2023-10-27 MED ORDER — LORAZEPAM 2 MG/ML IJ SOLN: 1.0000 mg | INTRAMUSCULAR | Status: DC | PRN

## 2023-10-27 MED ORDER — LACTATED RINGERS IV BOLUS
500.0000 mL | Freq: Once | INTRAVENOUS | Status: AC
  Administered 2023-10-27: 500 mL via INTRAVENOUS

## 2023-10-27 MED ORDER — LORAZEPAM 1 MG PO TABS
1.0000 mg | ORAL_TABLET | ORAL | Status: DC | PRN
Start: 2023-10-27 — End: 2023-10-30

## 2023-10-27 MED ORDER — SODIUM ZIRCONIUM CYCLOSILICATE 5 G PO PACK
10.0000 g | PACK | Freq: Once | ORAL | Status: AC
  Administered 2023-10-27: 10 g via ORAL
  Filled 2023-10-27: qty 2

## 2023-10-27 MED ORDER — FLUTICASONE PROPIONATE 50 MCG/ACT NA SUSP
2.0000 | Freq: Every day | NASAL | Status: DC
  Administered 2023-10-28: 2 via NASAL
  Filled 2023-10-27: qty 16

## 2023-10-27 MED ORDER — ADULT MULTIVITAMIN W/MINERALS CH
1.0000 | ORAL_TABLET | Freq: Every day | ORAL | Status: DC
  Administered 2023-10-27: 1 via ORAL
  Filled 2023-10-27: qty 1

## 2023-10-27 NOTE — ED Provider Notes (Signed)
[place] EMERGENCY DEPARTMENT AT [facility] Provider Note   CSN: [id number] Arrival date & time: 10/27/23  1048     History  Chief Complaint  Patient presents with   Hallucinations    [name] is a 67 y.o. male.  He has PMH of CAD, T2DM, high cholesterol and high blood pressure.  Presents the ER today complaining of "blacking out spells" described as feeling dizzy but denies loss of consciousness, states started around 7 PM last night and occurred when he was sitting down.  He states that he also was little bit confused, was talking and did not know what he was saying, also reports he was seeing people that were not there.  This is never happened the past.  He reports he does drink alcohol, about a pint of liquor a week but denies daily alcohol use, no alcohol use last night or today.  He does smoke cigarettes as well.  Denies chest pain, and shortness of breath.  HPI     Home Medications Prior to Admission medications   Medication Sig Start Date End Date Taking? Authorizing Provider  acetaminophen (TYLENOL) 500 MG tablet Take 1 tablet (500 mg total) by mouth every 6 (six) hours as needed. Patient taking differently: Take 500 mg by mouth every 6 (six) hours as needed for mild pain or headache (pain in neck). 10/13/19   Law, Waylan Boga, PA-C  amLODipine (NORVASC) 5 MG tablet TAKE 1 TABLET BY MOUTH ONCE A DAY. 04/11/23   Antoine Poche, MD  aspirin EC 81 MG tablet Take 1 tablet (81 mg total) by mouth daily. 02/18/20   Dyann Kief, PA-C  atorvastatin (LIPITOR) 80 MG tablet Take 1 tablet (80 mg total) by mouth daily. 01/24/20   Shon Hale, MD  clopidogrel (PLAVIX) 75 MG tablet Take 75 mg by mouth daily.    [provider]  Dulaglutide 1.5 MG/0.5ML SOPN Inject 1.5 mg into the skin once a week. On fridays    [provider]  fluticasone (FLONASE) 50 MCG/ACT nasal spray Place 2 sprays into both nostrils daily. 06/08/21   [provider]  gabapentin (NEURONTIN) 400 MG capsule Take 400 mg by mouth 3 (three) times daily.    [provider]  insulin glargine (LANTUS) 100 UNIT/ML Solostar Pen Inject 10 Units into the skin 2 (two) times daily.     [provider]  INVOKANA 100 MG TABS tablet Take 100 mg by mouth daily. 06/08/21   [provider]  lisinopril-hydrochlorothiazide (ZESTORETIC) 20-12.5 MG tablet TAKE 1 TABLET BY MOUTH ONCE DAILY. 07/06/23   Antoine Poche, MD  metoprolol succinate (TOPROL-XL) 50 MG 24 hr tablet TAKE 1 TABLET BY MOUTH ONCE DAILY. 07/07/23   Antoine Poche, MD  SYMBICORT 80-4.5 MCG/ACT inhaler Inhale 1 puff into the lungs daily. 04/07/21   [provider]  tiZANidine (ZANAFLEX) 4 MG tablet Take 4 mg by mouth daily at 12 noon. 06/08/21   [provider]      Allergies    Patient has no known allergies.    Review of Systems   Review of Systems  Physical Exam Updated Vital Signs BP 130/79 (BP Location: Right Arm)   Pulse 62   Temp 97.8 F (36.6 C) (Oral)   Resp 16   Ht 6\' 1"  (1.854 m)   Wt 108.9 kg   SpO2 100%   BMI 31.66 kg/m  Physical Exam Vitals and nursing note reviewed.  Constitutional:  General: He is not in acute distress.    Appearance: He is well-developed.  HENT:     Head: Normocephalic and atraumatic.     Mouth/Throat:     Mouth: Mucous membranes are moist.  Eyes:     Conjunctiva/sclera: Conjunctivae normal.  Cardiovascular:     Rate and Rhythm: Normal rate and regular rhythm.     Heart sounds: No murmur heard. Pulmonary:     Effort: Pulmonary effort is normal. No respiratory distress.     Breath sounds: Normal breath sounds.  Abdominal:     Palpations: Abdomen is soft.     Tenderness: There is no abdominal tenderness.  Musculoskeletal:        General: No swelling.     Cervical back: Neck supple.  Skin:    General: Skin is warm and dry.     Capillary Refill: Capillary refill takes less than 2 seconds.   Neurological:     General: No focal deficit present.     Mental Status: He is alert and oriented to person, place, and time.  Psychiatric:        Mood and Affect: Mood normal.    ED Results / Procedures / Treatments   Labs (all labs ordered are listed, but only abnormal results are displayed) Labs Reviewed  COMPREHENSIVE METABOLIC PANEL - Abnormal; Notable for the following components:      Result Value   Potassium 6.2 (*)    Glucose, Bld 130 (*)    BUN 31 (*)    Creatinine, Ser 1.40 (*)    GFR, Estimated 56 (*)    All other components within normal limits  SALICYLATE LEVEL - Abnormal; Notable for the following components:   Salicylate Lvl <7.0 (*)    All other components within normal limits  CBG MONITORING, ED - Abnormal; Notable for the following components:   Glucose-Capillary 111 (*)    All other components within normal limits  CBC WITH DIFFERENTIAL/PLATELET  ETHANOL  URINALYSIS, ROUTINE W REFLEX MICROSCOPIC    EKG None  Radiology No results found.  Procedures Procedures    Medications Ordered in ED Medications - No data to display  ED Course/ Medical Decision Making/ A&P                                 Medical Decision Making This patient presents to the ED for concern of near syncope, this involves an extensive number of treatment options, and is a complaint that carries with it a high risk of complications and morbidity.  The differential diagnosis includes orthostatic hypotension, electrolyte abnormality, sepsis, dehydration, CVA, other   Co morbidities that complicate the patient evaluation :   History of stroke, hyperkalemia   Additional history obtained:  Additional history obtained from EMR External records from outside source obtained and reviewed including notes and labs   Lab Tests:  I Ordered, and personally interpreted labs.  The pertinent results include: Hyperkalemia noted   Imaging Studies ordered:  I ordered imaging studies  including CT head which shows old cerebellar infarcts, no intracranial hemorrhage or mass effect I independently visualized and interpreted imaging within scope of identifying emergent findings  I agree with the radiologist interpretation   Cardiac Monitoring: / EKG:  The patient was maintained on a cardiac monitor.  I personally viewed and interpreted the cardiac monitored which showed an underlying rhythm of: Atrial fibrillation, right bundle branch block   Consultations Obtained:  I requested consultation with the [facility] hospitalist,  and discussed lab and imaging findings as well as pertinent plan - they recommend: Admission   Problem List / ED Course / Critical interventions / Medication management  #1 hyperkalemia at 6.2 possibly mild T wave changes-patient has history of similar, renal function around his baseline.  Treated with Lokelma, Lasix, insulin fluids and calcium. #2 dizziness with confusion-patient is out of TNK window, states he feels like he is back to his baseline, only abnormal exam finding is abnormal finger-to-nose but unsure if this is baseline or from prior cerebellar infarcts. #3 new A-fib-rate controlled, discussed with Dr. Wyline Mood from cardiology in consult who does not need formal consult but will write note with recommendations regarding anticoagulation  Discussed case with hospitalist who is agreeable with admission  I have reviewed the patients home medicines and have made adjustments as needed        Amount and/or Complexity of Data Reviewed Labs: ordered. Radiology: ordered. ECG/medicine tests: ordered.  Risk OTC drugs. Prescription drug management. Decision regarding hospitalization.           Final Clinical Impression(s) / ED Diagnoses Final diagnoses:  None    Rx / DC Orders ED Discharge Orders     None         Josem Kaufmann 10/27/23 1925    Bethann Berkshire, MD 10/30/23 1030

## 2023-10-27 NOTE — Progress Notes (Signed)
Contacted by ER staff, patient admitted with dizziness and hyperkalemia. Additional ER notes mention seeing things, people that are not in the room. History of heavy EtoH use.    From cardiac standpoint EKG shows new afib, he is rate controlled in the 60s. Would not explain his constellation of symptoms. He is already on metoprol for rate control, can continue. CHADS2Vasc score is at least 5 (age, HTN, CAD, CVA). Would d/c ASA/plavix, start eliquis 5mg  bid. Can update echo as inpatient. No additional cardiology recs at this time.    Dina Rich MD

## 2023-10-27 NOTE — H&P (Signed)
History and Physical    Patient: [name] VWU:[id number] DOB: Dec 08, 1955 DOA: 10/27/2023 DOS: the patient was seen and examined on 10/27/2023 PCP: Fleet Contras, MD  Patient coming from: Home  Chief Complaint:  Chief Complaint  Patient presents with   Hallucinations   HPI: [name] is a 67 y.o. male with medical history significant of tobacco abuse, alcohol abuse, diabetes, hypertension, hyperlipidemia, class I obesity and history of coronary artery disease; who presented to the hospital secondary to dizziness and difficulty finding words.  Patient's symptoms presented around 4 PM and essentially subsided earlier this morning, except for still feeling slightly dizzy reason why he decided to seek medical attention.  Patient presented to the urgent care for further evaluation and management and was referred to the emergency department for further evaluation. At time of arrival to the ED patient reports feeling back to baseline; no chest pain, no shortness of breath, no nausea, no vomiting, no dysuria, no hematuria, no fever, no chills, no overt bleeding or any focal motor deficit.  Workup was able to demonstrate new onset atrial fibrillation with rate control; no acute cardiopulmonary process on chest x-ray and CT scan demonstrating no acute hemorrhagic stroke; there was findings of old infarcts in his left frontal and right PCA territory/left cerebellar infarct.  TRH consulted to place patient in the hospital for further evaluation and management.  Review of Systems: As mentioned in the history of present illness. All other systems reviewed and are negative. Past Medical History:  Diagnosis Date   Coronary artery disease    Diabetes mellitus    Diastolic heart failure    Hyperlipidemia    Hypertension    Obesity    OSA on CPAP    Past Surgical History:  Procedure Laterality Date   CARDIAC CATHETERIZATION     NO PAST SURGERIES     Social History:  reports  that he has been smoking cigarettes. He has a 15 pack-year smoking history. He has never used smokeless tobacco. He reports current alcohol use of about 9.0 standard drinks of alcohol per week. He reports that he does not use drugs.  No Known Allergies  Family History  Problem Relation Age of Onset   Diabetes Father    Hypertension Father     Prior to Admission medications   Medication Sig Start Date End Date Taking? Authorizing Provider  acetaminophen (TYLENOL) 500 MG tablet Take 1 tablet (500 mg total) by mouth every 6 (six) hours as needed. Patient taking differently: Take 500 mg by mouth every 6 (six) hours as needed for mild pain or headache (pain in neck). 10/13/19   Law, Waylan Boga, PA-C  amLODipine (NORVASC) 5 MG tablet TAKE 1 TABLET BY MOUTH ONCE A DAY. 04/11/23   Antoine Poche, MD  atorvastatin (LIPITOR) 80 MG tablet Take 1 tablet (80 mg total) by mouth daily. 01/24/20   Shon Hale, MD  Dulaglutide 1.5 MG/0.5ML SOPN Inject 1.5 mg into the skin once a week. On fridays    [provider]  fluticasone (FLONASE) 50 MCG/ACT nasal spray Place 2 sprays into both nostrils daily. 06/08/21   [provider]  gabapentin (NEURONTIN) 400 MG capsule Take 400 mg by mouth 3 (three) times daily.    [provider]  insulin glargine (LANTUS) 100 UNIT/ML Solostar Pen Inject 10 Units into the skin 2 (two) times daily.     [provider]  INVOKANA 100 MG TABS tablet Take 100 mg by mouth daily. 06/08/21  [provider]  lisinopril-hydrochlorothiazide (ZESTORETIC) 20-12.5 MG tablet TAKE 1 TABLET BY MOUTH ONCE DAILY. 07/06/23   Antoine Poche, MD  metoprolol succinate (TOPROL-XL) 50 MG 24 hr tablet TAKE 1 TABLET BY MOUTH ONCE DAILY. 07/07/23   Antoine Poche, MD  SYMBICORT 80-4.5 MCG/ACT inhaler Inhale 1 puff into the lungs daily. 04/07/21   [provider]  tiZANidine (ZANAFLEX) 4 MG tablet Take 4 mg by mouth daily at 12 noon. 06/08/21    [provider]    Physical Exam: Vitals:   10/27/23 1111 10/27/23 1116 10/27/23 1234 10/27/23 1605  BP:  130/79 130/79 (!) 154/97  Pulse:  62 62 60  Resp:  16  18  Temp:  97.8 F (36.6 C)  97.7 F (36.5 C)  TempSrc:  Oral  Oral  SpO2:  100%  100%  Weight: 108.9 kg     Height: 6\' 1"  (1.854 m)      General exam: Alert, awake, oriented x 3; following commands appropriately.  No focal motor deficit. Respiratory system: Clear to auscultation. Respiratory effort normal.  Good saturation on room air. Cardiovascular system:  No rubs, no gallops, no JVD.  Right controlled. Gastrointestinal system: Abdomen is obese, nondistended, soft and nontender. No organomegaly or masses felt. Normal bowel sounds heard. Central nervous system: No focal neurological deficits. Extremities: No cyanosis or clubbing. Skin: No petechiae. Psychiatry: Judgement and insight appear normal. Mood & affect appropriate.   Data Reviewed: CBC: WBC 5.0, hemoglobin 15.0 and platelet count 1 56K Comprehensive metabolic panel: Sodium 137, potassium 6.2, chloride 101, bicarb 28, BUN 31, creatinine 1.40, normal LFTs and GFR 56   Assessment and Plan: 1-dizziness/TIA/stroke -CT head negative -Given patient's new onset atrial fibrillation and risk factors will admit for TIA workup. -He did not demonstrate any motor deficit and have passed swallowing test -Lipid panel, A1c, TSH and B12 will be checked -2D echo, MRI, carotid Dopplers and neurology consultation has been requested. -Aspirin for secondary prevention started; given new onset A-fib patient will require Eliquis for secondary prevention (CHA2DS2-VASc score 5); will follow neurology recommendations regarding timing to start blood thinner. -PT, OT and speech therapy requested.  2-type 2 diabetes mellitus with nephropathy -Patient with chronic kidney disease stage III 8 (currently at baseline) Sliding scale insulin has been started -Follow CBG  fluctuation -Update A1c.  3-hypertension -Will allowing permissive hypertension while ruling out acute ischemia will resume some of his antihypertensive agent -Heart healthy diet discussed with patient.  4-hyperkalemia -Holding ARB -Patient received treatment in the ED with calcium gluconate, insulin, Lasix and Lokelma. -Telemetry monitoring has been ordered -Will follow electrolytes in the morning. -Checking magnesium levels.  5-tobacco abuse -Cessation counseling provided -Nicotine patch has been ordered.  6-history of alcohol abuse -No signs of acute withdrawal currently appreciated -Thiamine and folic acid has been ordered -CIWA protocol in place. -Patient reported last drink approximately 48 hours prior to admission.  7-history of COPD/asthma -No acute exacerbation currently appreciated -Continue treatment with Symbicort equivalent (Breo Ellipta) -Continue as needed bronchodilator.  8-hyperlipidemia -Continue statin -Update lipid panel    Advance Care Planning:   Code Status: Full Code   Consults: Neurology; cardiology service curbside.  Family Communication: No family at bedside.  Severity of Illness: The appropriate patient status for this patient is OBSERVATION. Observation status is judged to be reasonable and necessary in order to provide the required intensity of service to ensure the patient's safety. The patient's presenting symptoms, physical exam findings, and initial radiographic and  laboratory data in the context of their medical condition is felt to place them at decreased risk for further clinical deterioration. Furthermore, it is anticipated that the patient will be medically stable for discharge from the hospital within 2 midnights of admission.   Author: Vassie Loll, MD 10/27/2023 5:23 PM  For on call review [email or web address].

## 2023-10-27 NOTE — ED Provider Notes (Signed)
[facility]    CSN: [id number] Arrival date & time: 10/27/23  9518      History   Chief Complaint Chief Complaint  Patient presents with   Altered Mental Status    HPI [name] is a 67 y.o. male.   The history is provided by the patient.   Patient presents for complaints of dizziness and confusion that started last evening around 7 PM.  Patient states that he suddenly became dizzy, was talking and did not realize what he was saying, and could not remember what he was supposed to be doing.  Patient denies headache, blurred vision, visual changes, chest pain, abdominal pain, nausea, vomiting, diarrhea, or rash.  Patient states he did check his blood glucose at home and it was normal.  He also denies any head injury or trauma or urinary symptoms.  Past medical history includes CAD, diabetes, hyperlipidemia, and hypertension.  Past Medical History:  Diagnosis Date   Coronary artery disease    Diabetes mellitus    Diastolic heart failure    Hyperlipidemia    Hypertension    Obesity    OSA on CPAP     Patient Active Problem List   Diagnosis Date Noted   Sepsis associated hypotension (HCC) 01/22/2020   HTN (hypertension) 01/22/2020   Sepsis secondary to UTI (HCC) 01/22/2020   Peripheral vascular disease (HCC)    Eschar of heel    Diabetic foot infection (HCC) 04/11/2019   Acute renal failure superimposed on stage 2 chronic kidney disease (HCC) 04/11/2019   Nasal turbinate hypertrophy 08/04/2017   Chronic cough 06/22/2017   Laryngopharyngeal reflux (LPR) 06/22/2017   TIA (transient ischemic attack) 07/13/2011   Cerebrovascular disease 07/13/2011   Tobacco abuse 07/12/2011   Hyperkalemia 07/12/2011   ATHEROSCLEROTIC CARDIOVASCULAR DISEASE 08/21/2009   DM2 (diabetes mellitus, type 2) (HCC) 05/12/2009   Mixed hyperlipidemia 05/12/2009   OVERWEIGHT/OBESITY 05/12/2009   OBSTRUCTIVE SLEEP APNEA 05/12/2009   HYPERTENSION, UNSPECIFIED 05/12/2009    DIASTOLIC HEART FAILURE, CHRONIC 05/12/2009    Past Surgical History:  Procedure Laterality Date   CARDIAC CATHETERIZATION     NO PAST SURGERIES         Home Medications    Prior to Admission medications   Medication Sig Start Date End Date Taking? Authorizing Provider  acetaminophen (TYLENOL) 500 MG tablet Take 1 tablet (500 mg total) by mouth every 6 (six) hours as needed. Patient taking differently: Take 500 mg by mouth every 6 (six) hours as needed for mild pain or headache (pain in neck). 10/13/19   Law, Waylan Boga, PA-C  amLODipine (NORVASC) 5 MG tablet TAKE 1 TABLET BY MOUTH ONCE A DAY. 04/11/23   Antoine Poche, MD  aspirin EC 81 MG tablet Take 1 tablet (81 mg total) by mouth daily. 02/18/20   Dyann Kief, PA-C  atorvastatin (LIPITOR) 80 MG tablet Take 1 tablet (80 mg total) by mouth daily. 01/24/20   Shon Hale, MD  clopidogrel (PLAVIX) 75 MG tablet Take 75 mg by mouth daily.    [provider]  Dulaglutide 1.5 MG/0.5ML SOPN Inject 1.5 mg into the skin once a week. On fridays    [provider]  fluticasone (FLONASE) 50 MCG/ACT nasal spray Place 2 sprays into both nostrils daily. 06/08/21   [provider]  gabapentin (NEURONTIN) 400 MG capsule Take 400 mg by mouth 3 (three) times daily.    [provider]  insulin glargine (LANTUS) 100 UNIT/ML Solostar Pen Inject 10 Units  into the skin 2 (two) times daily.     [provider]  INVOKANA 100 MG TABS tablet Take 100 mg by mouth daily. 06/08/21   [provider]  lisinopril-hydrochlorothiazide (ZESTORETIC) 20-12.5 MG tablet TAKE 1 TABLET BY MOUTH ONCE DAILY. 07/06/23   Antoine Poche, MD  metoprolol succinate (TOPROL-XL) 50 MG 24 hr tablet TAKE 1 TABLET BY MOUTH ONCE DAILY. 07/07/23   Antoine Poche, MD  SYMBICORT 80-4.5 MCG/ACT inhaler Inhale 1 puff into the lungs daily. 04/07/21   [provider]  tiZANidine (ZANAFLEX) 4 MG tablet Take 4 mg by mouth daily  at 12 noon. 06/08/21   [provider]    Family History Family History  Problem Relation Age of Onset   Diabetes Father    Hypertension Father     Social History Social History   Tobacco Use   Smoking status: Every Day    Current packs/day: 1.00    Average packs/day: 1 pack/day for 15.0 years (15.0 ttl pk-yrs)    Types: Cigarettes   Smokeless tobacco: Never   Tobacco comments:    Down to a cigarette per week  Vaping Use   Vaping status: Never Used  Substance Use Topics   Alcohol use: Yes    Alcohol/week: 9.0 standard drinks of alcohol    Types: 2 Shots of liquor, 7 Cans of beer per week    Comment: occ   Drug use: No     Allergies   Patient has no known allergies.   Review of Systems Review of Systems Per HPI  Physical Exam Triage Vital Signs ED Triage Vitals  Encounter Vitals Group     BP 10/27/23 0959 136/79     Systolic BP Percentile --      Diastolic BP Percentile --      Pulse Rate 10/27/23 0959 89     Resp 10/27/23 0959 16     Temp 10/27/23 0959 97.8 F (36.6 C)     Temp Source 10/27/23 0959 Oral     SpO2 10/27/23 0959 98 %     Weight --      Height --      Head Circumference --      Peak Flow --      Pain Score 10/27/23 1000 0     Pain Loc --      Pain Education --      Exclude from Growth Chart --    No data found.  Updated Vital Signs BP 136/79 (BP Location: Right Arm)   Pulse 89   Temp 97.8 F (36.6 C) (Oral)   Resp 16   SpO2 98%   Visual Acuity Right Eye Distance:   Left Eye Distance:   Bilateral Distance:    Right Eye Near:   Left Eye Near:    Bilateral Near:     Physical Exam Vitals and nursing note reviewed.  Constitutional:      General: He is not in acute distress.    Appearance: Normal appearance.  HENT:     Head: Normocephalic.  Eyes:     Extraocular Movements: Extraocular movements intact.     Pupils: Pupils are equal, round, and reactive to light.  Cardiovascular:     Rate and Rhythm: Normal rate  and regular rhythm.     Pulses: Normal pulses.     Heart sounds: Normal heart sounds.  Pulmonary:     Effort: Pulmonary effort is normal. No respiratory distress.     Breath  sounds: Normal breath sounds. No stridor. No wheezing, rhonchi or rales.  Abdominal:     General: Bowel sounds are normal.     Palpations: Abdomen is soft.     Tenderness: There is no abdominal tenderness.  Musculoskeletal:     Cervical back: Normal range of motion.  Skin:    General: Skin is warm and dry.  Neurological:     General: No focal deficit present.     Mental Status: He is alert and oriented to person, place, and time.     GCS: GCS eye subscore is 4. GCS verbal subscore is 5. GCS motor subscore is 6.     Cranial Nerves: Cranial nerves 2-12 are intact. No cranial nerve deficit or facial asymmetry.     Motor: No weakness.     Coordination: Coordination is intact.     Gait: Gait is intact. Gait normal.  Psychiatric:        Mood and Affect: Mood normal.        Behavior: Behavior normal.      UC Treatments / Results  Labs (all labs ordered are listed, but only abnormal results are displayed) Labs Reviewed  POCT FASTING CBG [facility] MANUAL ENTRY - Abnormal; Notable for the following components:      Result Value   POCT Glucose ([facility]) 118 (*)    All other components within normal limits    EKG   Radiology No results found.  Procedures Procedures (including critical care time)  Medications Ordered in UC Medications - No data to display  Initial Impression / Assessment and Plan / UC Course  I have reviewed the triage vital signs and the nursing notes.  Pertinent labs & imaging results that were available during my care of the patient were reviewed by me and considered in my medical decision making (see chart for details).  Fingerstick blood glucose was 118  It appears patient may have experienced some type of neurological event such as a TIA versus TIA, given the setting, cannot rule out  either.  Discussed findings with patient and advised that it is recommended that he go to the emergency department for further evaluation given his past medical history.  Patient was in agreement with this plan of care, he is currently with his family member who is able to drive him to the hospital.  Patient verbalized understanding, vital signs are stable, patient discharged to the emergency department for further evaluation.  Final Clinical Impressions(s) / UC Diagnoses   Final diagnoses:  Confusion  Dizziness  History of CAD (coronary artery disease)     Discharge Instructions      Go to the emergency department for further evaluation.     ED Prescriptions   None    PDMP not reviewed this encounter.   Abran Cantor, NP 10/27/23 1035

## 2023-10-27 NOTE — ED Triage Notes (Signed)
Pt reports sudden onset of dizziness and confusion since 7pm last night and lasted until this morning.    Denies head injury or urinary sxs.

## 2023-10-27 NOTE — ED Notes (Signed)
Attempted to call report to dept 300. Staff not ready to take report at this time.

## 2023-10-27 NOTE — Consult Note (Shared)
[facility] Telemedicine Consult   Requesting Provider: Vassie Loll Consult Participants: Myself, brother, patient, charge nurse Lawanna Kobus Location of the provider: [facility] hospital Location of the patient: [name]  This consult was provided via telemedicine with 2-way video and audio communication. The patient/family was informed that care would be provided in this way and agreed to receive care in this manner.    Chief Complaint: Dizziness, difficulty speaking  HPI: This is a 67 year old gentleman with a past medical history significant for hypertension, hyperlipidemia, OSA not adherent to CPAP, coronary artery disease, PVD, daily alcohol use, 1 pack/day smoking, BMI 31.66, new diagnosis of A-fib  He reports that he had some transient trouble talking on 12/17 which lasted several hours but improved around 7 PM.  However he continued to have dizziness for which she presented to the ED and was found to be in new onset atrial fibrillation.  MRI was obtained by primary team due to his aphasia and did reveal a stroke for which neurology was consulted.    Course has also been complicated by alcohol withdrawal symptoms (hallucinations), but otherwise he is at his baseline at this time and eager for discharge.  He reports he does not tolerate CPAP due to the sound, last got his machine about 2 years ago, has not tried any other machines  He denies any other recent symptoms, denies any bleeding concerns now or in the past  LKW: 4 PM on 12/19 Thrombolytic given?: No, symptoms resolved  IR Thrombectomy? No, symptoms resolved Modified Rankin Scale: 2-Slight disability-UNABLE to perform all activities but does not need assistance --has a cane/walker but reports he ambulates independently, does not drive secondary to loss of license due to alcohol use, still cooks and manages his own finances per patient report Time of teleneurologist evaluation: 2:30 PM  Exam: Vitals:   10/28/23  0000 10/28/23 0413  BP: 122/84 115/75  Pulse: 62 63  Resp: 16 16  Temp: 97.8 F (36.6 C) 98.6 F (37 C)  SpO2: 100% 98%    General: No acute distress, sitting comfortably at the side of the bed Pulmonary: breathing comfortably Cardiac: regular rate and rhythm on monitor   NIH Stroke scale 1A: Level of Consciousness - 0 1B: Ask Month and Age - 1 --reports age as 66 initially instead of 57.  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 No hemorrhage or mass effect.  Carotid US pending 1. Mild (1-49%) stenosis proximal right internal carotid artery secondary to heterogenous atherosclerotic plaque. 2. Mild (1-49%) stenosis proximal left internal carotid artery secondary to heterogenous atherosclerotic plaque. 3. Vertebral arteries are patent with normal antegrade flow.  MRA head pending 1. No emergent finding. No specific arterial lesion seen underlying the patient's recent left cerebral infarcts. 2. 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  Labs reviewed in epic and pertinent values follow:  Basic Metabolic Panel: Recent Labs  Lab 10/27/23 1126 10/27/23 1518 10/28/23 0600  NA 137  --  137  K 6.2*  --  4.2  CL 101  --  101  CO2 28  --  25  GLUCOSE 130*  --  89  BUN 31*  --  31*  CREATININE 1.40*  --  1.26*  CALCIUM 9.7  --   9.2  MG  --  2.5*  --     CBC: Recent Labs  Lab 10/27/23 1126 10/28/23 0600  WBC 5.0 5.1  NEUTROABS 2.5  --   HGB 15.0 14.3  HCT 46.5 44.1  MCV 91.4 90.7  PLT 156 156    Coagulation Studies: No results for input(s): "LABPROT", "INR" in the last 72 hours.   Lab Results  Component Value Date   HGBA1C 6.7 (H) 01/22/2020  Current A1c still pending at the time of this note   Lab Results  Component Value Date   CHOL 107 10/28/2023   HDL 38 (L) 10/28/2023   LDLCALC 48 10/28/2023   LDLDIRECT 67 09/23/2008   TRIG 103 10/28/2023   CHOLHDL 2.8 10/28/2023    ECHO pending  1. Calcified papillary muscle. Technically difficult study despite  contrast use.. Left ventricular ejection fraction, by estimation, is 50 to  55%. The left ventricle has low normal function. The left ventricle has no  regional wall motion abnormalities.  There is severe concentric left ventricular hypertrophy. Left ventricular  diastolic parameters are indeterminate.   2. Right ventricular systolic function is normal. The right ventricular  size is mildly enlarged.   3. Right atrial size was mildly dilated.   4. The mitral valve is grossly normal. No evidence of mitral valve  regurgitation.   5. The aortic valve is tricuspid. Aortic valve regurgitation is not  visualized. No aortic stenosis is present.   6. The inferior vena cava is normal in size with greater than 50%  respiratory variability, suggesting right atrial pressure of 3 mmHg.   Assessment:   Initial phone recommendations given verbally 12/20 - Rec holding Eliquis at this time, probably start tomorrow night pending full neuro exam given size of stroke - ASA 325 mg daily until [facility] started  - MRA head, carotid US - Stroke labs HgbA1c, fasting lipid panel - Echocardiogram  - Risk factor modification - Telemetry monitoring; - Blood pressure goal              - Permissive hypertension to 220/120 overnight, start to normalize tomorrow if  neurologically stable - PT consult, OT consult, Speech consult not indicated if patient is fully back to baseline    Additional recommendations on full evaluation today: -Start Eliquis 12/22 AM, importance of adherence to this medication stressed to patient as well as risk of bleeding reviewed -Discussed alcohol cessation and smoking cessation with patient, please provide [facility] quit line brochure to patient [email or web address] -AHA guidelines for no more than 2 drinks per day discussed with patient, but ideally abstinence, appreciate alcohol cessation resources to be provided by primary team -A1c pending, goal less than 7% -LDL meeting goal, continue home atorvastatin 80 mg -No significant stenosis on vessel imaging that requires close follow-up or intervention -Blood pressure goal normotension -Based on video examination, some concern for cognitive decline potentially secondary to chronic alcohol use as well as developing vascular dementia, consider outpatient neurocognitive evaluation -Continue follow-up outpatient for OSA, patient counseled on importance  of finding a machine he can tolerate -Discussed with Dr. Gwenlyn Perking via phone and secure chat  Brooke Dare MD-PhD [facility] [phone] If 8pm-8am, please page neurology on call as listed in [facility].

## 2023-10-27 NOTE — ED Triage Notes (Signed)
Pt stated "I am seeing things" Pt stated he was seeing people that are not in the room   Pt stated dizziness started around 4pm. Pt stated he usually drinks at least a pint of liquor a day, Canadian mist and a beer a day.  Pt stated he did not drink anything yesterday or today. Denies drug use

## 2023-10-27 NOTE — Discharge Instructions (Addendum)
Go to the emergency department for further evaluation

## 2023-10-27 NOTE — Telephone Encounter (Signed)
Pharmacy Patient Advocate Encounter  Insurance verification completed.    The patient is insured through  [employer] .     Ran test claim for Eliquis and the current 30 day co-pay is $4.60.   This test claim was processed through [facility]- copay amounts may vary at other pharmacies due to pharmacy/plan contracts, or as the patient moves through the different stages of their insurance plan.

## 2023-10-27 NOTE — ED Notes (Signed)
   10/27/23 1100  CIWA-Ar  Nausea and Vomiting 0  Tactile Disturbances 0  Tremor 0  Auditory Disturbances 0  Paroxysmal Sweats 0  Visual Disturbances 5  Anxiety 0  Headache, Fullness in Head 0  Agitation 0  Orientation and Clouding of Sensorium 0  CIWA-Ar Total 5

## 2023-10-27 NOTE — ED Notes (Signed)
Patient is being discharged from the Urgent Care and sent to the Emergency Department via POV . Per provider, patient is in need of higher level of care due to altered mental status . Patient is aware and verbalizes understanding of plan of care.  Vitals:   10/27/23 0959  BP: 136/79  Pulse: 89  Resp: 16  Temp: 97.8 F (36.6 C)  SpO2: 98%

## 2023-10-27 NOTE — Progress Notes (Signed)
Initial NIH assessment performed. Scoring may be skewed as patient had cataract surgery 2 years ago & requires glasses to see. Patient was unable to see written & illustrated materials completely to score the NIH accurately.   Patient could identify the following: Chair, glove, swing, plant/trees, & leaf.  When asked to read he couldn't see. When words & sentences were read to him & then instructed to repeat he was able to do so without issue & was clear.     10/27/23 1820  NIH Stroke Scale   Dizziness Present No  Headache Present No  Interval Shift assessment  Level of Consciousness (1a.)    0  LOC Questions (1b. )    1 ("67 yrs old" December 2014)  LOC Commands (1c. )    0  Best Gaze (2. )   0  Visual (3. )   0  Facial Palsy (4. )     1 (Slight L facial droop to cheek indentation.)  Motor Arm, Left (5a. )    0  Motor Arm, Right (5b. )  0  Motor Leg, Left (6a. )   0  Motor Leg, Right (6b. )  0  Limb Ataxia (7. ) 0  Sensory (8. )   0  Best Language (9. )   0 (Difficult to assess d/t pt inability to see clearly without glasses. When words said & instructed to repeat they were clear.)  Dysarthria (10. ) 0 (Difficult to assess without pt teeth, was clear/normal to family in room)  Extinction/Inattention (11.)    0  Complete NIHSS TOTAL 2

## 2023-10-28 ENCOUNTER — Other Ambulatory Visit (HOSPITAL_COMMUNITY): Payer: Self-pay | Admitting: *Deleted

## 2023-10-28 ENCOUNTER — Observation Stay (HOSPITAL_COMMUNITY): Payer: Medicare Other

## 2023-10-28 DIAGNOSIS — R42 Dizziness and giddiness: Secondary | ICD-10-CM | POA: Diagnosis not present

## 2023-10-28 DIAGNOSIS — I4891 Unspecified atrial fibrillation: Secondary | ICD-10-CM | POA: Diagnosis not present

## 2023-10-28 LAB — CBC
HCT: 44.1 % (ref 39.0–52.0)
Hemoglobin: 14.3 g/dL (ref 13.0–17.0)
MCH: 29.4 pg (ref 26.0–34.0)
MCHC: 32.4 g/dL (ref 30.0–36.0)
MCV: 90.7 fL (ref 80.0–100.0)
Platelets: 156 10*3/uL (ref 150–400)
RBC: 4.86 MIL/uL (ref 4.22–5.81)
RDW: 14.4 % (ref 11.5–15.5)
WBC: 5.1 10*3/uL (ref 4.0–10.5)
nRBC: 0 % (ref 0.0–0.2)

## 2023-10-28 LAB — ECHOCARDIOGRAM COMPLETE
Area-P 1/2: 5.27 cm2
Height: 73 in
S' Lateral: 4.1 cm
Weight: 3840 [oz_av]

## 2023-10-28 LAB — LIPID PANEL
Cholesterol: 107 mg/dL (ref 0–200)
HDL: 38 mg/dL — ABNORMAL LOW (ref >40)
LDL Cholesterol: 48 mg/dL (ref 0–99)
Total CHOL/HDL Ratio: 2.8 {ratio}
Triglycerides: 103 mg/dL (ref <150)
VLDL: 21 mg/dL (ref 0–40)

## 2023-10-28 LAB — BASIC METABOLIC PANEL
Anion gap: 11 (ref 5–15)
BUN: 31 mg/dL — ABNORMAL HIGH (ref 8–23)
CO2: 25 mmol/L (ref 22–32)
Calcium: 9.2 mg/dL (ref 8.9–10.3)
Chloride: 101 mmol/L (ref 98–111)
Creatinine, Ser: 1.26 mg/dL — ABNORMAL HIGH (ref 0.61–1.24)
GFR, Estimated: >60 mL/min (ref >60)
Glucose, Bld: 89 mg/dL (ref 70–99)
Potassium: 4.2 mmol/L (ref 3.5–5.1)
Sodium: 137 mmol/L (ref 135–145)

## 2023-10-28 LAB — HEMOGLOBIN A1C
Hgb A1c MFr Bld: 6.5 % — ABNORMAL HIGH (ref 4.8–5.6)
Mean Plasma Glucose: 139.85 mg/dL

## 2023-10-28 LAB — HIV ANTIBODY (ROUTINE TESTING W REFLEX): HIV Screen 4th Generation wRfx: NONREACTIVE

## 2023-10-28 MED ORDER — ADULT MULTIVITAMIN W/MINERALS CH: 1.0000 | ORAL_TABLET | Freq: Every day | ORAL | Status: AC

## 2023-10-28 MED ORDER — NICOTINE 21 MG/24HR TD PT24: 21.0000 mg | MEDICATED_PATCH | Freq: Every day | TRANSDERMAL | 2 refills | Status: AC

## 2023-10-28 MED ORDER — VITAMIN B-1 100 MG PO TABS: 100.0000 mg | ORAL_TABLET | Freq: Every day | ORAL | 2 refills | Status: AC

## 2023-10-28 MED ORDER — MECLIZINE HCL 25 MG PO TABS: 25.0000 mg | ORAL_TABLET | Freq: Three times a day (TID) | ORAL | 0 refills | Status: AC | PRN

## 2023-10-28 MED ORDER — APIXABAN 5 MG PO TABS: 5.0000 mg | ORAL_TABLET | Freq: Two times a day (BID) | ORAL | 3 refills | Status: DC

## 2023-10-28 MED ORDER — PERFLUTREN LIPID MICROSPHERE
1.0000 mL | INTRAVENOUS | Status: AC | PRN
  Administered 2023-10-28: 4 mL via INTRAVENOUS

## 2023-10-28 MED ORDER — FOLIC ACID 1 MG PO TABS: 1.0000 mg | ORAL_TABLET | Freq: Every day | ORAL | 2 refills | Status: AC

## 2023-10-28 NOTE — Evaluation (Addendum)
Physical Therapy Evaluation Patient Details Name: [name] MRN: [id number] DOB: 1956/10/12 Today's Date: 10/28/2023  History of Present Illness  [name] is a 67 y.o. male with medical history significant of tobacco abuse, alcohol abuse, diabetes, hypertension, hyperlipidemia, class I obesity and history of coronary artery disease; who presented to the hospital secondary to dizziness and difficulty finding words.  Patient's symptoms presented around 4 PM and essentially subsided earlier this morning, except for still feeling slightly dizzy reason why he decided to seek medical attention.  Patient presented to the urgent care for further evaluation and management and was referred to the emergency department for further evaluation.  At time of arrival to the ED patient reports feeling back to baseline; no chest pain, no shortness of breath, no nausea, no vomiting, no dysuria, no hematuria, no fever, no chills, no overt bleeding or any focal motor deficit.     Workup was able to demonstrate new onset atrial fibrillation with rate control; no acute cardiopulmonary process on chest x-ray and CT scan demonstrating no acute hemorrhagic stroke; there was findings of old infarcts in his left frontal and right PCA territory/left cerebellar infarct.     TRH consulted to place patient in the hospital for further evaluation and management.    Clinical Impression  Patient lying in bed on therapist arrival; pleasant and agreeable to therapist assessment.  Patient modified independent with bed mobility; taking slightly extra time due to weakness.  Demonstrates good sitting balance on edge of bed and able to reach over to don his shoes with Supervision.  Patient performs sit to stand with CGA for safety.  Patient ambulates x 60 ft out in hallway with 1 hand held assist.  He occasionally takes a misstep but does not have any loss of balance.  Would be safest with SPC.  States he is currently at his  baseline.  PT walks patient back to bed. He is able to return to bed without assist.  patient left in bed with call button in reach, bed alarm set and nursing notified of mobility status. Patient will benefit from continued skilled therapy services during the remainder of his hospital stay and at the next recommended venue of care to address deficits and promote return to optimal function.           If plan is discharge home, recommend the following: A little help with walking and/or transfers;A little help with bathing/dressing/bathroom   Can travel by private vehicle    yes    Equipment Recommendations None recommended by PT  Recommendations for Other Services       Functional Status Assessment Patient has had a recent decline in their functional status and demonstrates the ability to make significant improvements in function in a reasonable and predictable amount of time.     Precautions / Restrictions Precautions Precautions: Fall Restrictions Weight Bearing Restrictions Per Provider Order: No      Mobility  Bed Mobility Overal bed mobility: Modified Independent               Patient Response: Cooperative  Transfers Overall transfer level: Needs assistance   Transfers: Sit to/from Stand Sit to Stand: Contact guard assist                Ambulation/Gait Ambulation/Gait assistance: Contact guard assist, Min assist Gait Distance (Feet): 60 Feet Assistive device: 1 person hand held assist   Gait velocity: decreased     General Gait Details: occasional misstep but no  loss of balance  Stairs            Wheelchair Mobility     Tilt Bed Tilt Bed Patient Response: Cooperative  Modified Rankin (Stroke Patients Only)       Balance Overall balance assessment: Needs assistance Sitting-balance support: Feet supported, No upper extremity supported Sitting balance-Leahy Scale: Good Sitting balance - Comments: good sitting balance on edge of bed;  able to lean over and don his shoes   Standing balance support: During functional activity Standing balance-Leahy Scale: Fair Standing balance comment: fair standing balance; would be safest with AD; needs hand held assist for safety with balance                             Pertinent Vitals/Pain Pain Assessment Pain Assessment: No/denies pain    Home Living Family/patient expects to be discharged to:: Private residence Living Arrangements: Parent Available Help at Discharge: Family;Available 24 hours/day;Available PRN/intermittently Type of Home: House Home Access: Level entry       Home Layout: One level Home Equipment: Agricultural consultant (2 wheels);Cane - single point;Cane - quad;Shower seat      Prior Function Prior Level of Function : Independent/Modified Independent               ADLs Comments: he helps take care of his mother who has had a stroke     Extremity/Trunk Assessment   Upper Extremity Assessment Upper Extremity Assessment: Right hand dominant    Lower Extremity Assessment Lower Extremity Assessment: Generalized weakness    Cervical / Trunk Assessment Cervical / Trunk Assessment: Normal  Communication   Communication Communication: No apparent difficulties  Cognition Arousal: Alert Behavior During Therapy: WFL for tasks assessed/performed Overall Cognitive Status: Within Functional Limits for tasks assessed                                          General Comments      Exercises     Assessment/Plan    PT Assessment Patient needs continued PT services  PT Problem List Decreased activity tolerance;Decreased balance;Decreased mobility       PT Treatment Interventions Balance training;Gait training;Functional mobility training;Therapeutic activities;Therapeutic exercise    PT Goals (Current goals can be found in the Care Plan section)  Acute Rehab PT Goals Patient Stated Goal: return home PT Goal  Formulation: With patient Time For Goal Achievement: 11/11/23 Potential to Achieve Goals: Good    Frequency Min 2X/week     Co-evaluation               AM-PAC PT "6 Clicks" Mobility  Outcome Measure Help needed turning from your back to your side while in a flat bed without using bedrails?: None Help needed moving from lying on your back to sitting on the side of a flat bed without using bedrails?: None Help needed moving to and from a bed to a chair (including a wheelchair)?: A Little Help needed standing up from a chair using your arms (e.g., wheelchair or bedside chair)?: A Little Help needed to walk in hospital room?: A Little Help needed climbing 3-5 steps with a railing? : A Lot 6 Click Score: 19    End of Session   Activity Tolerance: Patient tolerated treatment well Patient left: in bed;with call bell/phone within reach;with bed alarm set Nurse Communication: Mobility status PT  Visit Diagnosis: Unsteadiness on feet (R26.81);Other abnormalities of gait and mobility (R26.89)    Time: 0740-0800 PT Time Calculation (min) (ACUTE ONLY): 20 min   Charges:   PT Evaluation $PT Eval Low Complexity: 1 Low   PT General Charges $$ ACUTE PT VISIT: 1 Visit         8:43 AM, 10/28/23 Tekeisha Hakim Small Marlea Gambill MPT [facility] [place] [phone] Ph:[phone]

## 2023-10-28 NOTE — Discharge Summary (Signed)
Physician Discharge Summary   Patient: [name] MRN: [id number] DOB: 1955-12-18  Admit date:     10/27/2023  Discharge date: 10/28/23  Discharge Physician: Vassie Loll   PCP: Fleet Contras, MD   Recommendations at discharge:  Reassess blood pressure and adjust antihypertensive treatment as needed Close monitoring of patient's CBGs/A1c with further adjustment to hypoglycemic regimen as required Make sure patient follow-up with neurology and cardiology service as instructed.  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 Hypertension Hyperkalemia Hyperlipidemia Class I obesity  Brief Hospital admission course: [name] is a 67 y.o. male with medical history significant of tobacco abuse, alcohol abuse, diabetes, hypertension, hyperlipidemia, class I obesity and history of coronary artery disease; who presented to the hospital secondary to dizziness and difficulty finding words.  Patient's symptoms presented around 4 PM and essentially subsided earlier this morning, except for still feeling slightly dizzy reason why he decided to seek medical attention.  Patient presented to the urgent care for further evaluation and management and was referred to the emergency department for further evaluation. At time of arrival to the ED patient reports feeling back to baseline; no chest pain, no shortness of breath, no nausea, no vomiting, no dysuria, no hematuria, no fever, no chills, no overt bleeding or any focal motor deficit.   Workup was able to demonstrate new onset atrial fibrillation with rate control; no acute cardiopulmonary process on chest x-ray and CT scan demonstrating no acute hemorrhagic stroke; there was findings of old infarcts in his left frontal and right PCA territory/left cerebellar infarct.   TRH consulted to place  patient in the hospital for further evaluation and management.  Assessment and Plan: 1-dizziness/TIA/ischemic stroke -CT head negative -MRI demonstrating acute left bilateral ischemic infarcts -Case discussed with neurology service with recommendations for Eliquis starting 10/29/2023 and risk factor modification -Outpatient follow-up with [facility]. -Home health PT has been recommended by physical therapy -No difficulties swallowing or with speech at discharge.  2-new onset atrial fibrillation -CHA2DS2-VASc score 5 -Continue metoprolol for rate control -2D echo with preserved ejection fraction (50-55%) and no significant valvular disorder -Eliquis 5 mg twice a day initiated -Outpatient follow-up with cardiology service recommended.  3-hypertension -Resume home antihypertensive regimen -Heart healthy/low-sodium diet discussed with patient. 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  5-tobacco abuse/alcohol abuse -Cessation counseling provided -Nicotine patch, thiamine and folic acid has been prescribed at discharge -Patient respective to recommendations and planning to quit.  6-class I obesity -Body mass index is 31.66 kg/m.  -Low-calorie diet, portion control and increase physical activity discussed patient.  7-hyperkalemia -Most likely associated with the use of Invokana and chronic lisinopril -Patient received appropriate treatment for hyperkalemia and at discharge electrolyte within normal limit -Invokana has been discontinued at discharge.  8-COPD/asthma -Resume home bronchodilator management -Acute exacerbation appreciated.  Consultants: Neurology service; cardiology curbside. Procedures performed:  See below for x-ray report; 2D echo. Disposition: Home with home health PT Diet recommendation: Heart healthy modified carbohydrate diet.  DISCHARGE MEDICATION: Allergies as of 10/28/2023   No Known Allergies      Medication List     STOP taking these medications    Invokana 100 MG Tabs tablet  Generic drug: canagliflozin   tiZANidine 4 MG tablet Commonly known as: ZANAFLEX       TAKE these medications    acetaminophen 500 MG tablet Commonly known as: TYLENOL Take 1 tablet (500 mg total) by mouth every 6 (six) hours as needed. What changed: reasons to take this   amLODipine 5 MG tablet Commonly known as: NORVASC TAKE 1 TABLET BY MOUTH ONCE A DAY.   apixaban 5 MG Tabs tablet Commonly known as: ELIQUIS Take 1 tablet (5 mg total) by mouth 2 (two) times daily.   atorvastatin 80 MG tablet Commonly known as: LIPITOR Take 1 tablet (80 mg total) by mouth daily.   Dulaglutide 1.5 MG/0.5ML Soaj Inject 1.5 mg into the skin once a week. On fridays   fluticasone 50 MCG/ACT nasal spray Commonly known as: FLONASE Place 2 sprays into both nostrils daily.   folic acid 1 MG tablet Commonly known as: FOLVITE Take 1 tablet (1 mg total) by mouth daily. Start taking on: October 29, 2023   gabapentin 400 MG capsule Commonly known as: NEURONTIN Take 400 mg by mouth 3 (three) times daily.   insulin glargine 100 UNIT/ML Solostar Pen Commonly known as: LANTUS Inject 10 Units into the skin 2 (two) times daily.   lisinopril-hydrochlorothiazide 20-12.5 MG tablet Commonly known as: ZESTORETIC TAKE 1 TABLET BY MOUTH ONCE DAILY.   meclizine 25 MG tablet Commonly known as: ANTIVERT Take 1 tablet (25 mg total) by mouth every 8 (eight) hours as needed for dizziness.   metoprolol succinate 50 MG 24 hr tablet Commonly known as: TOPROL-XL TAKE 1 TABLET BY MOUTH ONCE DAILY.   multivitamin with minerals Tabs tablet Take 1 tablet by mouth daily. Start taking on: October 29, 2023    nicotine 21 mg/24hr patch Commonly known as: NICODERM CQ - dosed in mg/24 hours Place 1 patch (21 mg total) onto the skin daily. Start taking on: October 29, 2023   Symbicort 80-4.5 MCG/ACT inhaler Generic drug: budesonide-formoterol Inhale 1 puff into the lungs daily.   thiamine 100 MG tablet Commonly known as: Vitamin B-1 Take 1 tablet (100 mg total) by mouth daily. Start taking on: October 29, 2023        Follow-up Information     Fleet Contras, MD. Schedule an appointment as soon as possible for a visit in 10 day(s).   Specialty: Internal Medicine Contact information: [address] [place] [address] [phone]                Discharge Exam: Ceasar Mons Weights   10/27/23 1111  Weight: 108.9 kg   General exam: Alert, awake, oriented x 3; in no acute distress and feeling ready to go home. Respiratory system: Clear to auscultation. Respiratory effort normal. Cardiovascular system:RRR. No murmurs, rubs, gallops. Gastrointestinal system: Abdomen is obese, nondistended, soft and nontender. No organomegaly or masses felt. Normal bowel sounds heard. Central nervous system: No focal neurological deficits. Extremities: No cyanosis or clubbing. Skin: No rashes, lesions or ulcers Psychiatry:  Mood & affect appropriate.    Condition at discharge: Stable and improved.  The results of significant diagnostics from this hospitalization (including imaging, microbiology, ancillary and laboratory) are listed below for reference.   Imaging Studies: ECHOCARDIOGRAM COMPLETE Result Date: 10/28/2023    ECHOCARDIOGRAM REPORT   Patient Name:   IZYK GOLDFINE Date of Exam: 10/28/2023 Medical Rec #:  [id number]           Height:       73.0 in Accession #:  [id number]          Weight:       240.0 lb Date of Birth:  04-29-1956           BSA:          2.325 m Patient Age:    67 years            BP:           115/75 mmHg Patient Gender: M                   HR:           63  bpm. [facility] Procedure: 2D Echo, Cardiac Doppler, Color Doppler and Intracardiac            Opacification Agent Indications:    Atrial Fibrillation I48.91  History:        Patient has prior history of Echocardiogram examinations, most                 recent 07/12/2011. TIA; Risk Factors:Sleep Apnea, Dyslipidemia,                 Diabetes and Hypertension. Tobacco abuse.  Sonographer:    Celesta Gentile RCS Referring Phys: [phone] Deisy Ozbun IMPRESSIONS  1. Calcified papillary muscle. Technically difficult study despite contrast use.. Left ventricular ejection fraction, by estimation, is 50 to 55%. The left ventricle has low normal function. The left ventricle has no regional wall motion abnormalities. There is severe concentric left ventricular hypertrophy. Left ventricular diastolic parameters are indeterminate.  2. Right ventricular systolic function is normal. The right ventricular size is mildly enlarged.  3. Right atrial size was mildly dilated.  4. The mitral valve is grossly normal. No evidence of mitral valve regurgitation.  5. The aortic valve is tricuspid. Aortic valve regurgitation is not visualized. No aortic stenosis is present.  6. The inferior vena cava is normal in size with greater than 50% respiratory variability, suggesting right atrial pressure of 3 mmHg. Comparison(s): Unable to access 2012 study. FINDINGS  Left Ventricle: Calcified papillary muscle. Technically difficult study despite contrast use. Left ventricular ejection fraction, by estimation, is 50 to 55%. The left ventricle has low normal function. The left ventricle has no regional wall motion abnormalities. Definity contrast agent was given IV to delineate the left ventricular endocardial borders. The left ventricular internal cavity size was normal in size. There is severe concentric left ventricular hypertrophy. Left ventricular diastolic parameters are indeterminate. Right Ventricle: The right ventricular size is  mildly enlarged. No increase in right ventricular wall thickness. Right ventricular systolic function is normal. Left Atrium: Left atrial size was normal in size. Right Atrium: Right atrial size was mildly dilated. Pericardium: There is no evidence of pericardial effusion. Mitral Valve: The mitral valve is grossly normal. No evidence of mitral valve regurgitation. Tricuspid Valve: The tricuspid valve is grossly normal. Tricuspid valve regurgitation is not demonstrated. Aortic Valve: The aortic valve is tricuspid. Aortic valve regurgitation is not visualized. No aortic stenosis is present. Pulmonic Valve: The pulmonic valve was normal in structure. Pulmonic valve regurgitation is not visualized. Aorta: The aortic root is normal in size and structure. Venous: The inferior vena cava is normal in size with greater than 50% respiratory variability, suggesting right atrial pressure of 3 mmHg. IAS/Shunts: The atrial septum is grossly normal.  LEFT VENTRICLE PLAX 2D LVIDd:         5.20 cm LVIDs:  4.10 cm LV PW:         1.30 cm LV IVS:        1.50 cm LVOT diam:     2.20 cm LV SV:         44 LV SV Index:   19 LVOT Area:     3.80 cm  RIGHT VENTRICLE TAPSE (M-mode): 1.8 cm LEFT ATRIUM             Index        RIGHT ATRIUM           Index LA diam:        4.70 cm 2.02 cm/m   RA Area:     27.30 cm LA Vol (A2C):   79.7 ml 34.28 ml/m  RA Volume:   92.50 ml  39.79 ml/m LA Vol (A4C):   55.1 ml 23.70 ml/m LA Biplane Vol: 68.4 ml 29.42 ml/m  AORTIC VALVE LVOT Vmax:   59.75 cm/s LVOT Vmean:  39.850 cm/s LVOT VTI:    0.116 m  AORTA Ao Root diam: 3.80 cm MITRAL VALVE MV Area (PHT): 5.27 cm    SHUNTS MV Decel Time: 144 msec    Systemic VTI:  0.12 m MV E velocity: 65.10 cm/s  Systemic Diam: 2.20 cm Riley Lam MD Electronically signed by Riley Lam MD Signature Date/Time: 10/28/2023/2:40:20 PM    Final    MR ANGIO HEAD WO CONTRAST Result Date: 10/28/2023 CLINICAL DATA:  Stroke, determine and bulk source.  EXAM: MRA HEAD WITHOUT CONTRAST TECHNIQUE: Angiographic images of the Circle of Willis were acquired using MRA technique without intravenous contrast. COMPARISON:  Brain MRI from yesterday FINDINGS: Anterior circulation: The carotid arteries show atheromatous irregularity with mild to moderate narrowing at the left ICA anterior genu but no flow reducing stenosis or ulceration suspected. No branch occlusion, beading, or aneurysm Posterior circulation: Codominant vertebral arteries. The vertebral and basilar arteries are smoothly contoured and diffusely patent. Asymmetric lost flow in the right PCA after a P2 segment stenosis, correlating with the chronic infarct. Negative for aneurysm or vascular malformation. IMPRESSION: 1. No emergent finding. No specific arterial lesion seen underlying the patient's recent left cerebral infarcts. 2. 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 Electronically Signed   By: Tiburcio Pea M.D.   On: 10/28/2023 12:29   US Carotid Bilateral Result Date: 10/28/2023 CLINICAL DATA:  Stroke EXAM: BILATERAL CAROTID DUPLEX ULTRASOUND TECHNIQUE: Wallace Cullens scale imaging, color Doppler and duplex ultrasound were performed of bilateral carotid and vertebral arteries in the neck. COMPARISON:  None Available. FINDINGS: Criteria: Quantification of carotid stenosis is based on velocity parameters that correlate the residual internal carotid diameter with NASCET-based stenosis levels, using the diameter of the distal internal carotid lumen as the denominator for stenosis measurement. The following velocity measurements were obtained: RIGHT ICA: 64/24 cm/sec CCA: 60/13 cm/sec SYSTOLIC ICA/CCA RATIO:  1.5 ECA:  45 cm/sec LEFT ICA: 67/25 cm/sec CCA: 77/14 cm/sec SYSTOLIC ICA/CCA RATIO:  1.0 ECA:  54 cm/sec RIGHT CAROTID ARTERY: Mild atherosclerotic plaque in the common and proximal internal carotid artery. By peak systolic velocity criteria, the estimated stenosis is  less than 50%. RIGHT VERTEBRAL ARTERY:  Patent with normal antegrade flow. LEFT CAROTID ARTERY: Mild atherosclerotic plaque. By peak systolic velocity criteria, the estimated stenosis is less than 50%. LEFT VERTEBRAL ARTERY:  Patent with normal antegrade flow. IMPRESSION: 1. Mild (1-49%) stenosis proximal right internal carotid artery secondary to heterogenous atherosclerotic plaque. 2. Mild (1-49%) stenosis proximal  left internal carotid artery secondary to heterogenous atherosclerotic plaque. 3. Vertebral arteries are patent with normal antegrade flow. Signed, Sterling Big, MD, RPVI [facility] Electronically Signed   By: Malachy Moan M.D.   On: 10/28/2023 09:52   MR BRAIN WO CONTRAST Result Date: 10/27/2023 CLINICAL DATA:  Dizziness and hallucinations EXAM: MRI HEAD WITHOUT CONTRAST TECHNIQUE: Multiplanar, multiecho pulse sequences of the brain and surrounding structures were obtained without intravenous contrast. COMPARISON:  None Available. FINDINGS: Brain: Restricted diffusion with ADC correlate in the left parietal cortex and subcortical white matter (series 5, images 28-38). These areas are associated with increased T2 hyperintense signal, likely edema. No acute hemorrhage, mass, mass effect, or midline shift. No hydrocephalus or extra-axial collection. Pituitary and craniocervical junction within normal limits. No hemosiderin deposition to suggest remote hemorrhage. Sequela of remote cortical infarcts in the left greater than right frontal lobe and right greater than left occipital lobe. Additional lacunar infarcts in the bilateral basal ganglia, pons, and cerebellar hemispheres. Vascular: Normal arterial flow voids. Skull and upper cervical spine: Normal marrow signal. Sinuses/Orbits: Clear paranasal sinuses. Remote right lamina papyracea defect. Status post bilateral lens replacements. No acute finding in the orbits. Other: The  mastoid air cells are well aerated. IMPRESSION: Acute infarcts in the left parietal cortex and subcortical white matter. No hemorrhage or mass effect. These results will be called to the ordering clinician or representative by the Radiologist Assistant, and communication documented in the PACS or [employer]. Electronically Signed   By: Wiliam Ke M.D.   On: 10/27/2023 16:07   DG Chest 2 View Result Date: 10/27/2023 CLINICAL DATA:  Cough EXAM: CHEST - 2 VIEW COMPARISON:  X-ray 01/22/2020. FINDINGS: Sternal wires. Minimal elevation left hemidiaphragm. No consolidation, pneumothorax or effusion. No edema. Normal cardiopericardial silhouette. Degenerative changes along the spine. IMPRESSION: Postop chest.  No acute cardiopulmonary disease. Electronically Signed   By: Karen Kays M.D.   On: 10/27/2023 15:00   CT Head Wo Contrast Result Date: 10/27/2023 CLINICAL DATA:  Neuro deficit, acute, stroke suspected EXAM: CT HEAD WITHOUT CONTRAST TECHNIQUE: Contiguous axial images were obtained from the base of the skull through the vertex without intravenous contrast. RADIATION DOSE REDUCTION: This exam was performed according to the departmental dose-optimization program which includes automated exposure control, adjustment of the mA and/or kV according to patient size and/or use of iterative reconstruction technique. COMPARISON:  CT head March 17, 21. FINDINGS: Brain: Interval but remote appearing left frontal infarct. Similar remote right PCA territory infarct. Remote left cerebellar infarcts. No evidence of acute large vascular territory infarct. Partially empty sella. Vascular: No hyperdense vessel. Skull: Normal. Negative for fracture or focal lesion. Sinuses/Orbits: Clear sinuses. No acute findings. Remote right medial orbital wall fracture. Left supraorbital fixation. Other: No mastoid effusions. IMPRESSION: 1. No evidence of acute intracranial abnormality. An MRI could provide more sensitive evaluation  for acute infarct if clinically warranted 2. Interval but remote appearing left frontal infarct. 3. Similar remote right PCA territory and left cerebellar infarcts. Electronically Signed   By: Feliberto Harts M.D.   On: 10/27/2023 12:58   Labs: CBC: Recent Labs  Lab 10/27/23 1126 10/28/23 0600  WBC 5.0 5.1  NEUTROABS 2.5  --   HGB 15.0 14.3  HCT 46.5 44.1  MCV 91.4 90.7  PLT 156 156   Basic Metabolic Panel: Recent Labs  Lab 10/27/23 1126 10/27/23 1518 10/28/23 0600  NA 137  --  137  K 6.2*  --  4.2  CL 101  --  101  CO2 28  --  25  GLUCOSE 130*  --  89  BUN 31*  --  31*  CREATININE 1.40*  --  1.26*  CALCIUM 9.7  --  9.2  MG  --  2.5*  --    Liver Function Tests: Recent Labs  Lab 10/27/23 1126  AST 19  ALT 18  ALKPHOS 69  BILITOT 0.4  PROT 8.0  ALBUMIN 4.2   CBG: Recent Labs  Lab 10/27/23 1113 10/27/23 1624  GLUCAP 111* 92    Discharge time spent: greater than 30 minutes.  Signed: Vassie Loll, MD [facility] 10/28/2023

## 2023-10-28 NOTE — Plan of Care (Signed)
  Problem: Acute Rehab PT Goals(only PT should resolve) Goal: Pt Will Go Supine/Side To Sit Outcome: Progressing Flowsheets (Taken 10/28/2023 0845) Pt will go Supine/Side to Sit: Independently Goal: Patient Will Transfer Sit To/From Stand Outcome: Progressing Flowsheets (Taken 10/28/2023 0845) Patient will transfer sit to/from stand: with modified independence Goal: Pt Will Transfer Bed To Chair/Chair To Bed Outcome: Progressing Flowsheets (Taken 10/28/2023 0845) Pt will Transfer Bed to Chair/Chair to Bed: with modified independence Goal: Pt Will Ambulate Outcome: Progressing Flowsheets (Taken 10/28/2023 0845) Pt will Ambulate:  100 feet  with supervision  with least restrictive assistive device

## 2023-10-28 NOTE — Progress Notes (Signed)
*  PRELIMINARY RESULTS* Echocardiogram 2D Echocardiogram has been performed with Definity.  Stacey Drain 10/28/2023, 11:55 AM

## 2023-10-28 NOTE — Care Management Obs Status (Signed)
MEDICARE OBSERVATION STATUS NOTIFICATION   Patient Details  Name: [name] MRN: [id number] Date of Birth: [birth year]   Medicare Observation Status Notification Given:  Yes    Tatiyana Foucher Marsh Dolly, LCSW 10/28/2023, 10:39 AM

## 2023-10-28 NOTE — Progress Notes (Signed)
   10/28/23 1245  TOC Brief Assessment  Insurance and Status Reviewed  Patient has primary care physician Yes  Home environment has been reviewed From home  Prior level of function: Independent  Prior/Current Home Services No current home services  Social Drivers of Health Review SDOH reviewed interventions complete (SA resources/education added to AVS)  Readmission risk has been reviewed Yes  Transition of care needs transition of care needs identified, TOC will continue to follow   CSW visited pt at bedside, completed MOON.  Patient agreed to HHPT - no preference.  CSW contacted [facility] and referral accepted. SA education resources added to AVS.  Echo pending. TOC to follow.

## 2023-11-20 ENCOUNTER — Ambulatory Visit: Payer: Medicaid Other | Admitting: Cardiology

## 2023-11-20 ENCOUNTER — Encounter: Payer: Self-pay | Admitting: Cardiology

## 2023-11-20 NOTE — Progress Notes (Deleted)
 Clinical Summary [name] is a 67 y.o.male   seen today for follow up of the following medical problems.    1. 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   - echo 07/2011 LVEF 45-50% with inferior hypokinesis.     - no recent chest pain. No SOB/DOE - compliant with meds   2. HTN - compliant with meds       3. Hyperlipidemia -compliant with meds   Jan 22 TC 116 HDL 32 TG 190 LDL 67 - he is on atorvatatin 80mg   - recent labs with pcp   4. History of CVA - prior history of CVAs, recurrent 10/2023       5. EtOH use  6. Afib - new diagnosis during 10/2023 admission - started on eliquis  5mg  bid, had been on metoprolol .    **recheck lytes   Past Medical History:  Diagnosis Date   Coronary artery disease    Diabetes mellitus    Diastolic heart failure    Hyperlipidemia    Hypertension    Obesity    OSA on CPAP      No Known Allergies   Current Outpatient Medications  Medication Sig Dispense Refill   acetaminophen  (TYLENOL ) 500 MG tablet Take 1 tablet (500 mg total) by mouth every 6 (six) hours as needed. (Patient taking differently: Take 500 mg by mouth every 6 (six) hours as needed for mild pain or headache (pain in neck).) 30 tablet 0   amLODipine  (NORVASC ) 5 MG tablet TAKE 1 TABLET BY MOUTH ONCE A DAY. 90 tablet 3   apixaban  (ELIQUIS ) 5 MG TABS tablet Take 1 tablet (5 mg total) by mouth 2 (two) times daily. 60 tablet 3   atorvastatin  (LIPITOR) 80 MG tablet Take 1 tablet (80 mg total) by mouth daily. 90 tablet 3   Dulaglutide 1.5 MG/0.5ML SOPN Inject 1.5 mg into the skin once a week. On fridays     fluticasone  (FLONASE ) 50 MCG/ACT nasal spray Place 2 sprays into both nostrils daily.     folic acid  (FOLVITE ) 1 MG tablet Take 1 tablet (1 mg total) by mouth daily. 30 tablet 2   gabapentin  (NEURONTIN ) 400 MG capsule Take 400 mg by mouth 3 (three) times daily.     insulin  glargine (LANTUS ) 100 UNIT/ML Solostar Pen Inject 10  Units into the skin 2 (two) times daily.      lisinopril -hydrochlorothiazide  (ZESTORETIC ) 20-12.5 MG tablet TAKE 1 TABLET BY MOUTH ONCE DAILY. 90 tablet 2   meclizine  (ANTIVERT ) 25 MG tablet Take 1 tablet (25 mg total) by mouth every 8 (eight) hours as needed for dizziness. 45 tablet 0   metoprolol  succinate (TOPROL -XL) 50 MG 24 hr tablet TAKE 1 TABLET BY MOUTH ONCE DAILY. 90 tablet 2   Multiple Vitamin (MULTIVITAMIN WITH MINERALS) TABS tablet Take 1 tablet by mouth daily.     nicotine  (NICODERM CQ  - DOSED IN MG/24 HOURS) 21 mg/24hr patch Place 1 patch (21 mg total) onto the skin daily. 28 patch 2   SYMBICORT  80-4.5 MCG/ACT inhaler Inhale 1 puff into the lungs daily.     thiamine  (VITAMIN B-1) 100 MG tablet Take 1 tablet (100 mg total) by mouth daily. 30 tablet 2   No current facility-administered medications for this visit.     Past Surgical History:  Procedure Laterality Date   CARDIAC CATHETERIZATION     NO PAST SURGERIES       No Known Allergies  Family History  Problem Relation Age of Onset   Diabetes Father    Hypertension Father      Social History [name] reports that he has been smoking cigarettes. He has a 15 pack-year smoking history. He has never used smokeless tobacco. [name] reports current alcohol use of about 9.0 standard drinks of alcohol per week.   Review of Systems CONSTITUTIONAL: No weight loss, fever, chills, weakness or fatigue.  HEENT: Eyes: No visual loss, blurred vision, double vision or yellow sclerae.No hearing loss, sneezing, congestion, runny nose or sore throat.  SKIN: No rash or itching.  CARDIOVASCULAR:  RESPIRATORY: No shortness of breath, cough or sputum.  GASTROINTESTINAL: No anorexia, nausea, vomiting or diarrhea. No abdominal pain or blood.  GENITOURINARY: No burning on urination, no polyuria NEUROLOGICAL: No headache, dizziness, syncope, paralysis, ataxia, numbness or tingling in the extremities. No change in bowel or  bladder control.  MUSCULOSKELETAL: No muscle, back pain, joint pain or stiffness.  LYMPHATICS: No enlarged nodes. No history of splenectomy.  PSYCHIATRIC: No history of depression or anxiety.  ENDOCRINOLOGIC: No reports of sweating, cold or heat intolerance. No polyuria or polydipsia.  [name]   Physical Examination There were no vitals filed for this visit. There were no vitals filed for this visit.  Gen: resting comfortably, no acute distress HEENT: no scleral icterus, pupils equal round and reactive, no palptable cervical adenopathy,  CV Resp: Clear to auscultation bilaterally GI: abdomen is soft, non-tender, non-distended, normal bowel sounds, no hepatosplenomegaly MSK: extremities are warm, no edema.  Skin: warm, no rash Neuro:  no focal deficits Psych: appropriate affect   Diagnostic Studies 07/2011 Echo LVEF 45-50%, mod biatrial enlargement, diastolic function not described.    05/2012 Carotid US   IMPRESSION: Bilateral atherosclerosis, not resulting in hemodynamically significant stenosis.   Jan 2016 ABI IMPRESSION: Normal ABIs at rest. Nonocclusive peripheral vascular disease, noncompressible in the left thigh and calf regions.  10/2023 echo 1. Calcified papillary muscle. Technically difficult study despite  contrast use.. Left ventricular ejection fraction, by estimation, is 50 to  55%. The left ventricle has low normal function. The left ventricle has no  regional wall motion abnormalities.  There is severe concentric left ventricular hypertrophy. Left ventricular  diastolic parameters are indeterminate.   2. Right ventricular systolic function is normal. The right ventricular  size is mildly enlarged.   3. Right atrial size was mildly dilated.   4. The mitral valve is grossly normal. No evidence of mitral valve  regurgitation.   5. The aortic valve is tricuspid. Aortic valve regurgitation is not  visualized. No aortic stenosis is present.   6. The inferior vena  cava is normal in size with greater than 50%  respiratory variability, suggesting right atrial pressure of 3 mmHg.     Assessment and Plan  1. CAD - he is on plavix by another provider, not for any cardiac issue - no recent symptoms, continue current meds     2. HTN - at goal, continue current meds   3. Hyperlipidemia - request pcp labs, continue statin. Would aim for LDL <55      Dorn PHEBE Ross, M.D., F.A.C.C.

## 2024-01-11 ENCOUNTER — Ambulatory Visit: Payer: Medicare Other | Admitting: Neurology

## 2024-02-08 ENCOUNTER — Ambulatory Visit: Payer: Medicare Other | Admitting: Neurology

## 2024-03-04 ENCOUNTER — Other Ambulatory Visit: Payer: Self-pay | Admitting: Cardiology

## 2024-03-04 NOTE — Telephone Encounter (Signed)
 Prescription refill request for Eliquis  received. Indication:cva Last office visit:upcoming Scr:1.26  12/24 Age: 68 Weight:108.9  kg  Prescription refilled

## 2024-04-24 ENCOUNTER — Other Ambulatory Visit: Payer: Self-pay | Admitting: Cardiology

## 2024-05-03 ENCOUNTER — Other Ambulatory Visit: Payer: Self-pay | Admitting: Cardiology

## 2024-05-06 ENCOUNTER — Emergency Department (HOSPITAL_COMMUNITY): Admission: EM | Admit: 2024-05-06 | Discharge: 2024-05-06 | Disposition: A | Discharge status: Home / Self Care

## 2024-05-06 ENCOUNTER — Encounter (HOSPITAL_COMMUNITY): Payer: Self-pay

## 2024-05-06 ENCOUNTER — Other Ambulatory Visit: Payer: Self-pay

## 2024-05-06 ENCOUNTER — Emergency Department (HOSPITAL_COMMUNITY)

## 2024-05-06 DIAGNOSIS — Z794 Long term (current) use of insulin: Secondary | ICD-10-CM | POA: Insufficient documentation

## 2024-05-06 DIAGNOSIS — Z7901 Long term (current) use of anticoagulants: Secondary | ICD-10-CM | POA: Diagnosis not present

## 2024-05-06 DIAGNOSIS — M545 Low back pain, unspecified: Secondary | ICD-10-CM | POA: Diagnosis not present

## 2024-05-06 DIAGNOSIS — M549 Dorsalgia, unspecified: Secondary | ICD-10-CM | POA: Diagnosis present

## 2024-05-06 MED ORDER — METHOCARBAMOL 500 MG PO TABS: 500.0000 mg | ORAL_TABLET | Freq: Three times a day (TID) | ORAL | 0 refills | Status: AC | PRN

## 2024-05-06 MED ORDER — ACETAMINOPHEN 500 MG PO TABS
1000.0000 mg | ORAL_TABLET | Freq: Once | ORAL | Status: AC
  Administered 2024-05-06: 1000 mg via ORAL
  Filled 2024-05-06: qty 2

## 2024-05-06 MED ORDER — LIDOCAINE 5 % EX PTCH
1.0000 | MEDICATED_PATCH | CUTANEOUS | Status: DC
  Administered 2024-05-06: 1 via TRANSDERMAL
  Filled 2024-05-06: qty 1

## 2024-05-06 MED ORDER — KETOROLAC TROMETHAMINE 15 MG/ML IJ SOLN
15.0000 mg | Freq: Once | INTRAMUSCULAR | Status: AC
  Administered 2024-05-06: 15 mg via INTRAMUSCULAR
  Filled 2024-05-06: qty 1

## 2024-05-06 MED ORDER — LIDOCAINE 5 % EX PTCH: 1.0000 | MEDICATED_PATCH | CUTANEOUS | 0 refills | Status: AC

## 2024-05-06 NOTE — ED Triage Notes (Signed)
 Pt arrived via [facility] from home c/o lumbar back pain that began this morning when he woke up at 0530. Pt denies injury.

## 2024-05-06 NOTE — Discharge Instructions (Addendum)
 You can alternate Tylenol  Motrin as needed for pain.  Take your robaxin as needed up to 3 times a day for severe pain.  Use your usual lidocaine patch once a day as needed.

## 2024-05-06 NOTE — ED Notes (Signed)
 Pt reports he applied a lidocaine patch to his lower back this afternoon w/o relief.

## 2024-05-06 NOTE — ED Provider Notes (Signed)
 [place] EMERGENCY DEPARTMENT AT [facility] Provider Note   CSN: [id number] Arrival date & time: 05/06/24  8195     Patient presents with: Back Pain   [name] is a 68 y.o. male.   68 year old male presents for evaluation of back pain.  States this started this morning when he woke up.  He denies any falls or trauma.  States is located over the lower back, in the middle, nonradiating and he has no difficulty walking.  Has not taken any medications for pain.  Denies any other symptoms or concerns at this time.   Back Pain Associated symptoms: no abdominal pain, no chest pain, no dysuria and no fever        Prior to Admission medications   Medication Sig Start Date End Date Taking? Authorizing Provider  lidocaine (LIDODERM) 5 % Place 1 patch onto the skin daily for 7 days. Remove & Discard patch within 12 hours or as directed by MD 05/06/24 05/13/24 Yes Anissa Abbs L, DO  methocarbamol (ROBAXIN) 500 MG tablet Take 1 tablet (500 mg total) by mouth every 8 (eight) hours as needed for up to 5 days for muscle spasms (pain). 05/06/24 05/11/24 Yes Romel Dumond L, DO  acetaminophen  (TYLENOL ) 500 MG tablet Take 1 tablet (500 mg total) by mouth every 6 (six) hours as needed. Patient taking differently: Take 500 mg by mouth every 6 (six) hours as needed for mild pain or headache (pain in neck). 10/13/19   Law, Alexandra M, PA-C  amLODipine  (NORVASC ) 5 MG tablet Take 1 tablet (5 mg total) by mouth daily. Pt needs to keep upcoming appt in July for further refills 05/03/24   Alvan Dorn FALCON, MD  atorvastatin  (LIPITOR) 80 MG tablet Take 1 tablet (80 mg total) by mouth daily. 01/24/20   Pearlean Manus, MD  Dulaglutide 1.5 MG/0.5ML SOPN Inject 1.5 mg into the skin once a week. On fridays    [provider]  ELIQUIS  5 MG TABS tablet TAKE ONE TABLET BY MOUTH TWICE DAILY 03/04/24   Alvan Dorn FALCON, MD  fluticasone  (FLONASE ) 50 MCG/ACT nasal spray Place 2 sprays into  both nostrils daily. 06/08/21   [provider]  folic acid  (FOLVITE ) 1 MG tablet Take 1 tablet (1 mg total) by mouth daily. 10/29/23   Ricky Fines, MD  gabapentin  (NEURONTIN ) 400 MG capsule Take 400 mg by mouth 3 (three) times daily.    [provider]  insulin  glargine (LANTUS ) 100 UNIT/ML Solostar Pen Inject 10 Units into the skin 2 (two) times daily.     [provider]  lisinopril -hydrochlorothiazide  (ZESTORETIC ) 20-12.5 MG tablet TAKE 1 TABLET BY MOUTH ONCE DAILY. 07/06/23   Alvan Dorn FALCON, MD  meclizine  (ANTIVERT ) 25 MG tablet Take 1 tablet (25 mg total) by mouth every 8 (eight) hours as needed for dizziness. 10/28/23   Ricky Fines, MD  metoprolol  succinate (TOPROL -XL) 50 MG 24 hr tablet Take 1 tablet (50 mg total) by mouth daily. Please keep scheduled appointment for future refills. Thank you. 04/25/24   Alvan Dorn FALCON, MD  Multiple Vitamin (MULTIVITAMIN WITH MINERALS) TABS tablet Take 1 tablet by mouth daily. 10/29/23   Ricky Fines, MD  nicotine  (NICODERM CQ  - DOSED IN MG/24 HOURS) 21 mg/24hr patch Place 1 patch (21 mg total) onto the skin daily. 10/29/23   Ricky Fines, MD  SYMBICORT  80-4.5 MCG/ACT inhaler Inhale 1 puff into the lungs daily. 04/07/21   [provider]  thiamine  (VITAMIN B-1) 100 MG  tablet Take 1 tablet (100 mg total) by mouth daily. 10/29/23   Ricky Fines, MD    Allergies: Patient has no known allergies.    Review of Systems  Constitutional:  Negative for chills and fever.  HENT:  Negative for ear pain and sore throat.   Eyes:  Negative for pain and visual disturbance.  Respiratory:  Negative for cough and shortness of breath.   Cardiovascular:  Negative for chest pain and palpitations.  Gastrointestinal:  Negative for abdominal pain and vomiting.  Genitourinary:  Negative for dysuria and hematuria.  Musculoskeletal:  Positive for back pain. Negative for arthralgias.  Skin:  Negative for color change and rash.   Neurological:  Negative for seizures and syncope.  All other systems reviewed and are negative.   Updated Vital Signs BP 121/84   Pulse 89   Temp 98 F (36.7 C)   Resp 14   Ht 6' 1 (1.854 m)   Wt 109 kg   SpO2 98%   BMI 31.70 kg/m   Physical Exam Vitals and nursing note reviewed.  Constitutional:      General: He is not in acute distress.    Appearance: He is well-developed. He is not ill-appearing.  HENT:     Head: Normocephalic and atraumatic.   Eyes:     Conjunctiva/sclera: Conjunctivae normal.    Cardiovascular:     Rate and Rhythm: Normal rate and regular rhythm.     Heart sounds: No murmur heard. Pulmonary:     Effort: Pulmonary effort is normal. No respiratory distress.     Breath sounds: Normal breath sounds.  Abdominal:     Palpations: Abdomen is soft.     Tenderness: There is no abdominal tenderness.   Musculoskeletal:        General: No swelling.     Cervical back: Neck supple.     Comments: There is midline L-spine tenderness to palpation, no muscular tenderness to palpation, no evidence of trauma or deformity   Skin:    General: Skin is warm and dry.     Capillary Refill: Capillary refill takes less than 2 seconds.   Neurological:     Mental Status: He is alert.   Psychiatric:        Mood and Affect: Mood normal.     (all labs ordered are listed, but only abnormal results are displayed) Labs Reviewed - No data to display  EKG: None  Radiology: DG Lumbar Spine 2-3 Views Result Date: 05/06/2024 CLINICAL DATA:  Low back pain since awakening this morning. No acute injury. EXAM: LUMBAR SPINE - 2-3 VIEW COMPARISON:  Abdominopelvic CT 01/22/2020. FINDINGS: There are 5 lumbar type vertebral bodies. The alignment is near anatomic with a minimal degenerative anterolisthesis at L4-5. No evidence of acute fracture or pars defect. There is mild multilevel spondylosis with disc space narrowing most advanced at L4-5 and L5-S1. There is multilevel facet  arthropathy. Diffuse aortoiliac atherosclerosis noted. IMPRESSION: No acute osseous findings. Multilevel spondylosis as described. Electronically Signed   By: Elsie Perone M.D.   On: 05/06/2024 19:02     Procedures   Medications Ordered in the ED  ketorolac  (TORADOL ) 15 MG/ML injection 15 mg (has no administration in time range)  acetaminophen  (TYLENOL ) tablet 1,000 mg (has no administration in time range)  lidocaine (LIDODERM) 5 % 1 patch (has no administration in time range)  Medical Decision Making   Medical Decision Making Nursing notes are reviewed. Differential diagnosis for this patient would include but not limited to: Sprain, strain, arthritis, contusion, spasm, degenerative disc disease, other  Records reviewed: Prior hospital records reviewed and patient was admitted 10-27-2023 for hyperkalemia   Studies: Imaging reviewed independently radiology and patient has some related degenerative changes but no other acute abnormality  Emergency Department Course:  Vital signs and pulse oximetry are reviewed, evaluated by myself and found to be within normal limits prior to final disposition. Findings of laboratory testing and medical imaging are discussed with patient and family that is available. Patient agrees with the medical care plan as follows:  Patient's imaging as above.  Will give him lidocaine patch, injection of Toradol  and Tylenol  as here.  Advised Tylenol  Motrin as needed for pain will give prescription for Robaxin and lidocaine patches.  Advise close up with primary care doctor nausea return to the ER for any worsening symptoms.  Feels no vomiting discharged home.  Problems Addressed: Acute midline low back pain without sciatica: acute illness or injury  Amount and/or Complexity of Data Reviewed External Data Reviewed: notes. Radiology: ordered and independent interpretation performed. Decision-making details documented in  ED Course.  Risk OTC drugs. Prescription drug management.    Final diagnoses:  Acute midline low back pain without sciatica    ED Discharge Orders          Ordered    lidocaine (LIDODERM) 5 %  Every 24 hours        05/06/24 2109    methocarbamol (ROBAXIN) 500 MG tablet  Every 8 hours PRN        05/06/24 2109               Yana Schorr L, DO 05/06/24 2109

## 2024-06-03 ENCOUNTER — Other Ambulatory Visit: Payer: Self-pay | Admitting: Cardiology

## 2024-06-03 ENCOUNTER — Encounter: Payer: Self-pay | Admitting: Neurology

## 2024-06-03 ENCOUNTER — Ambulatory Visit: Admitting: Neurology

## 2024-06-04 ENCOUNTER — Other Ambulatory Visit: Payer: Self-pay

## 2024-06-04 ENCOUNTER — Telehealth: Payer: Self-pay | Admitting: Cardiology

## 2024-06-04 MED ORDER — METOPROLOL SUCCINATE ER 50 MG PO TB24: 50.0000 mg | ORAL_TABLET | Freq: Every day | ORAL | 0 refills | Status: DC

## 2024-06-04 MED ORDER — AMLODIPINE BESYLATE 5 MG PO TABS
5.0000 mg | ORAL_TABLET | Freq: Every day | ORAL | 0 refills | Status: DC
Start: 2024-06-04 — End: 2024-07-03

## 2024-06-04 NOTE — Telephone Encounter (Signed)
*  STAT* If patient is at the pharmacy, call can be transferred to refill team.   1. Which medications need to be refilled? (please list name of each medication and dose if known)   amLODipine  (NORVASC ) 5 MG tablet    metoprolol  succinate (TOPROL -XL) 50 MG 24 hr tablet    2. Which pharmacy/location (including street and city if local pharmacy) is medication to be sent to?  [facility] - [place], [place] - [address]      3. Do they need a 30 day or 90 day supply? 90 day    Pt is out of medication and the med pack for the month goes out today from pharmacy.

## 2024-06-06 ENCOUNTER — Ambulatory Visit: Attending: Cardiology | Admitting: Cardiology

## 2024-06-06 ENCOUNTER — Encounter: Payer: Self-pay | Admitting: Cardiology

## 2024-06-06 NOTE — Progress Notes (Deleted)
 Clinical Summary Mr. Tiso is a 68 y.o.male  seen today for follow up of the following medical problems.    1. 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   - echo 07/2011 LVEF 45-50% with inferior hypokinesis.     - no recent chest pain. No SOB/DOE - compliant with meds   2. HTN - compliant with meds       3. Hyperlipidemia -compliant with meds   Jan 22 TC 116 HDL 32 TG 190 LDL 67 - he is on atorvatatin 80mg   - recent labs with pcp   4. History of CVA         5. EtOH use    6. Afib - new diagnosis during 10/2023 ER presentation with AMS, diagnosed with CVA - ASA/plavix were stopped, started on eliquis  5mg  bid.   7. CVA - admit 10/2023 with AMS, diagnosed with CVA   Past Medical History:  Diagnosis Date   Coronary artery disease    Diabetes mellitus    Diastolic heart failure    Hyperlipidemia    Hypertension    Obesity    OSA on CPAP      No Known Allergies   Current Outpatient Medications  Medication Sig Dispense Refill   acetaminophen  (TYLENOL ) 500 MG tablet Take 1 tablet (500 mg total) by mouth every 6 (six) hours as needed. (Patient taking differently: Take 500 mg by mouth every 6 (six) hours as needed for mild pain or headache (pain in neck).) 30 tablet 0   amLODipine  (NORVASC ) 5 MG tablet Take 1 tablet (5 mg total) by mouth daily. 30 tablet 0   atorvastatin  (LIPITOR) 80 MG tablet Take 1 tablet (80 mg total) by mouth daily. 90 tablet 3   Dulaglutide 1.5 MG/0.5ML SOPN Inject 1.5 mg into the skin once a week. On fridays     ELIQUIS  5 MG TABS tablet TAKE ONE TABLET BY MOUTH TWICE DAILY 60 tablet 3   fluticasone  (FLONASE ) 50 MCG/ACT nasal spray Place 2 sprays into both nostrils daily.     folic acid  (FOLVITE ) 1 MG tablet Take 1 tablet (1 mg total) by mouth daily. 30 tablet 2   gabapentin  (NEURONTIN ) 400 MG capsule Take 400 mg by mouth 3 (three) times daily.     insulin  glargine (LANTUS ) 100 UNIT/ML Solostar Pen  Inject 10 Units into the skin 2 (two) times daily.      lisinopril -hydrochlorothiazide  (ZESTORETIC ) 20-12.5 MG tablet TAKE 1 TABLET BY MOUTH ONCE DAILY. 90 tablet 2   meclizine  (ANTIVERT ) 25 MG tablet Take 1 tablet (25 mg total) by mouth every 8 (eight) hours as needed for dizziness. 45 tablet 0   metoprolol  succinate (TOPROL -XL) 50 MG 24 hr tablet Take 1 tablet (50 mg total) by mouth daily. 30 tablet 0   Multiple Vitamin (MULTIVITAMIN WITH MINERALS) TABS tablet Take 1 tablet by mouth daily.     nicotine  (NICODERM CQ  - DOSED IN MG/24 HOURS) 21 mg/24hr patch Place 1 patch (21 mg total) onto the skin daily. 28 patch 2   SYMBICORT  80-4.5 MCG/ACT inhaler Inhale 1 puff into the lungs daily.     thiamine  (VITAMIN B-1) 100 MG tablet Take 1 tablet (100 mg total) by mouth daily. 30 tablet 2   No current facility-administered medications for this visit.     Past Surgical History:  Procedure Laterality Date   CARDIAC CATHETERIZATION     NO PAST SURGERIES  No Known Allergies    Family History  Problem Relation Age of Onset   Diabetes Father    Hypertension Father      Social History Mr. Ontiveros reports that he has been smoking cigarettes. He has a 15 pack-year smoking history. He has been exposed to tobacco smoke. He has never used smokeless tobacco. Mr. Fleet reports current alcohol use of about 9.0 standard drinks of alcohol per week.   Review of Systems CONSTITUTIONAL: No weight loss, fever, chills, weakness or fatigue.  HEENT: Eyes: No visual loss, blurred vision, double vision or yellow sclerae.No hearing loss, sneezing, congestion, runny nose or sore throat.  SKIN: No rash or itching.  CARDIOVASCULAR:  RESPIRATORY: No shortness of breath, cough or sputum.  GASTROINTESTINAL: No anorexia, nausea, vomiting or diarrhea. No abdominal pain or blood.  GENITOURINARY: No burning on urination, no polyuria NEUROLOGICAL: No headache, dizziness, syncope, paralysis, ataxia, numbness  or tingling in the extremities. No change in bowel or bladder control.  MUSCULOSKELETAL: No muscle, back pain, joint pain or stiffness.  LYMPHATICS: No enlarged nodes. No history of splenectomy.  PSYCHIATRIC: No history of depression or anxiety.  ENDOCRINOLOGIC: No reports of sweating, cold or heat intolerance. No polyuria or polydipsia.  [name]   Physical Examination There were no vitals filed for this visit. There were no vitals filed for this visit.  Gen: resting comfortably, no acute distress HEENT: no scleral icterus, pupils equal round and reactive, no palptable cervical adenopathy,  CV Resp: Clear to auscultation bilaterally GI: abdomen is soft, non-tender, non-distended, normal bowel sounds, no hepatosplenomegaly MSK: extremities are warm, no edema.  Skin: warm, no rash Neuro:  no focal deficits Psych: appropriate affect   Diagnostic Studies   07/2011 Echo LVEF 45-50%, mod biatrial enlargement, diastolic function not described.    05/2012 Carotid US   IMPRESSION: Bilateral atherosclerosis, not resulting in hemodynamically significant stenosis.   Jan 2016 ABI IMPRESSION: Normal ABIs at rest. Nonocclusive peripheral vascular disease, noncompressible in the left thigh and calf regions.   10/2023 echo 1. Calcified papillary muscle. Technically difficult study despite  contrast use.. Left ventricular ejection fraction, by estimation, is 50 to  55%. The left ventricle has low normal function. The left ventricle has no  regional wall motion abnormalities.  There is severe concentric left ventricular hypertrophy. Left ventricular  diastolic parameters are indeterminate.   2. Right ventricular systolic function is normal. The right ventricular  size is mildly enlarged.   3. Right atrial size was mildly dilated.   4. The mitral valve is grossly normal. No evidence of mitral valve  regurgitation.   5. The aortic valve is tricuspid. Aortic valve regurgitation is not   visualized. No aortic stenosis is present.   6. The inferior vena cava is normal in size with greater than 50%  respiratory variability, suggesting right atrial pressure of 3 mmHg.   10/2023 carotid US  IMPRESSION: 1. Mild (1-49%) stenosis proximal right internal carotid artery secondary to heterogenous atherosclerotic plaque. 2. Mild (1-49%) stenosis proximal left internal carotid artery secondary to heterogenous atherosclerotic plaque. 3. Vertebral arteries are patent with normal antegrade flow.    Assessment and Plan  1. CAD - he is on plavix by another provider, not for any cardiac issue - no recent symptoms, continue current meds     2. HTN - at goal, continue current meds   3. Hyperlipidemia - request pcp labs, continue statin. Would aim for LDL <55      Dorn PHEBE Ross, M.D., F.A.C.C.

## 2024-06-07 ENCOUNTER — Ambulatory Visit: Admitting: Cardiology

## 2024-06-23 ENCOUNTER — Other Ambulatory Visit: Payer: Self-pay | Admitting: Cardiology

## 2024-06-24 NOTE — Telephone Encounter (Signed)
 Prescription refill request for Eliquis  received. Indication: CVA/CAD Last office visit: 11/14/22 CHRISTELLA Pavy PA-C  (appt 07/31/24) Scr: 1.26 on 10/28/23  Epic Age: 68 Weight: 108.9kg  Based on above findings Eliquis  5mg  twice daily is the appropriate dose.  Refill approved until upcoming appt.

## 2024-07-02 ENCOUNTER — Other Ambulatory Visit: Payer: Self-pay | Admitting: Cardiology

## 2024-07-03 ENCOUNTER — Other Ambulatory Visit: Payer: Self-pay | Admitting: Cardiology

## 2024-07-29 NOTE — Progress Notes (Deleted)
 Cardiology Office Note:  .   Date:  07/29/2024  ID:  Zinedine Ellner, DOB 02-Nov-1956, MRN [id number] PCP: Shelda Atlas, MD  [facility] Providers Cardiologist:  Alvan Carrier, MD {  History of Present Illness: .   [name] is a 68 y.o. male  with PMHx of CAD (s/p CABG 04/2008 with LIMA-LAD, SVG-ramus, SVG to LCX, SVG to RCA), HTN, HLD, DM2 with nephropathy, paroxysmal afib, mild bilateral carotid stenosis (10/2023 US  carotid), history of CVA, tobacco use, ETOH use, COPD/Asthma who reports to [facility] office for follow up.   Last seen in heartcare 11/2022 by Olivia Pavy, PA-C for yearly follow up.  Overall doing well without any cardiac complaints.  Discussed cessation and/or cutting back on tobacco and EtOH use.  Continued on amlodipine  5 mg daily, ASA 81 mg daily, Lipitor 80 mg daily, Plavix 75 mg daily, lisinopril /HCTZ 20/12.5 daily, Toprol  XL 50 mg daily.    echo 07/2011 LVEF 45-50% with inferior hypokinesis.  Patient presented to urgent care on 10/27/2023 for dizziness and finding words and was referred to the ED for further evaluation.  Hospitalization 12/20-21/2024, however symptoms subsided by this time and patient back to baseline with no active complaints.  Further workup revealed new onset coarse afib rate controlled.  No acute cardiopulmonary findings on CXR.  CT demonstrated no acute findings, however there were old infarcts in left frontal and right PCA territory/left cerebral infarct.  MRI demonstrated acute left bilateral ischemic infarcts.  Case discussed with neurology who recommended Eliquis  and risk factor modification. Echo showed EF 50 to 55%, calcified papillary muscles, severe concentric LVH, mildly enlarged RV size, mildly dilated RA size.  Also noted hyperkalemia most likely secondary to Invokana and lisinopril .  Discontinued Invokana. K improved to 4.2 prior to discharge.  Cardiology was curbsided for consult and recommended discontinuing  ASA/Plavix, start Eliquis , and continue metoprolol  for rate control.  Continued on amlodipine  5 mg daily, Eliquis  5 mg twice daily, lisinopril /HCTZ 20/12.5 daily, Toprol  XL 50 mg daily, Lipitor 80 mg daily  Today, reports ### and denies ###.  Reports compliance with medications.  Dietary habitats:  Activity level:  Social: Smoke 1 PPD? Drinks 1-2 quarts of beer daily and a pint of liquor on the weekend. Denies drug use  Denies any recent hospitalizations or visits to the emergency department.   Paroxysmal atrial fibrillation (HCC) ECHO 10/2023: mildly dilated RA size  EKG today shows  On exam, noted regular, rate and rhythm OR irregularly irregular.  Denies palpitations or active bleeding.  On AC with CBC WNL in 10/2023. Order CBC.  Discontinued ASA/Plavix 10/2023 for previous CVA and started Eliquis .  Continue on Toprol  XL 50 mg daily and Eliquis  5 mg BID (dose appropriate for age 61, weight # kg, Cr #  in )  (Age > 80, Body wt < 60 kg, Cr > 1.5; if +2 then decrease dose  2.5 mg BID)   Coronary artery disease involving coronary bypass graft of native heart without angina pectoris Hyperlipidemia LDL goal <55 s/p CABG 04/2008 with LIMA-LAD, SVG-ramus, SVG to LCX, SVG to RCA Echo showed EF 50 to 55%, calcified papillary muscles, severe concentric LVH, mildly enlarged RV size  Denies any anginal symptoms.  No need for further ischemic evaluation at this time Discontinued ASA/Plavix 10/2023 for previous CVA since starting Eliquis .  Continue on Lipitor 80 mg daily  Essential hypertension Hyperkalemia Reports well controlled Home BP:  BP this OV well controlled today:  Cr 1.4>1.26 and K  6.2> 4.2 in 10/2023. Discontinued Invokana in 10/2023. Order BMP.  Continue on amlodipine  5 mg daily and lisinopril /HCTZ 20/12.5 daily.  Encourage physical activity for 150 minutes per week and heart healthy low sodium diet. Discussed limiting sodium intake to < 2 grams daily.     Bilateral carotid  Stenosis  10/2023 US  carotid: mild 1-49% bilaterally  No bruits on exam. Denies presyncope/syncope.  Continue to monitor.  Will plan to repeat Carotid US  if bruit present or develops associated symptoms.    Tobacco abuse  ETOH abuse   Type 2 diabetes mellitus with stage 2 chronic kidney disease, with long-term current use of insulin  (HCC) DM 6.5 in 10/2024 Continue to follow with?  ROS: 10 point review of system has been reviewed and considered negative except ones been listed in the HPI.   Studies Reviewed: .   Carotid  US  IMPRESSION: 10/27/2023 1. Mild (1-49%) stenosis proximal right internal carotid artery secondary to heterogenous atherosclerotic plaque. 2. Mild (1-49%) stenosis proximal left internal carotid artery secondary to heterogenous atherosclerotic plaque. 3. Vertebral arteries are patent with normal antegrade flow.  ECHO IMPRESSIONS 10/2023   1. Calcified papillary muscle. Technically difficult study despite  contrast use.. Left ventricular ejection fraction, by estimation, is 50 to  55%. The left ventricle has low normal function. The left ventricle has no  regional wall motion abnormalities.  There is severe concentric left ventricular hypertrophy. Left ventricular  diastolic parameters are indeterminate.   2. Right ventricular systolic function is normal. The right ventricular  size is mildly enlarged.   3. Right atrial size was mildly dilated.   4. The mitral valve is grossly normal. No evidence of mitral valve  regurgitation.   5. The aortic valve is tricuspid. Aortic valve regurgitation is not  visualized. No aortic stenosis is present.   6. The inferior vena cava is normal in size with greater than 50%  respiratory variability, suggesting right atrial pressure of 3 mmHg.   Comparison(s): Unable to access 2012 study.  Risk Assessment/Calculations:   {Does this patient have ATRIAL FIBRILLATION?:[phone]} No BP recorded.  {Refresh Note OR Click here to  enter BP  :1}***       Physical Exam:   VS:  There were no vitals taken for this visit.   Wt Readings from Last 3 Encounters:  05/06/24 240 lb 4.8 oz (109 kg)  10/27/23 240 lb (108.9 kg)  11/14/22 240 lb (108.9 kg)    GEN: Well nourished, well developed in no acute distress while sitting in chair.  NECK: No JVD; No carotid bruits CARDIAC: ***RRR, no murmurs, rubs, gallops RESPIRATORY:  Clear to auscultation without rales, wheezing or rhonchi  ABDOMEN: Soft, non-tender, non-distended EXTREMITIES:  No edema; No deformity   ASSESSMENT AND PLAN: .   ***    {Are you ordering a CV Procedure (e.g. stress test, cath, DCCV, TEE, etc)?   Press F2        :[id number]}  Dispo: ***  Signed, Lorette CINDERELLA Kapur, PA-C

## 2024-07-31 ENCOUNTER — Ambulatory Visit: Attending: Student | Admitting: Physician Assistant

## 2024-07-31 DIAGNOSIS — I480 Paroxysmal atrial fibrillation: Secondary | ICD-10-CM

## 2024-07-31 DIAGNOSIS — Z720 Tobacco use: Secondary | ICD-10-CM

## 2024-07-31 DIAGNOSIS — Z794 Long term (current) use of insulin: Secondary | ICD-10-CM

## 2024-07-31 DIAGNOSIS — I25810 Atherosclerosis of coronary artery bypass graft(s) without angina pectoris: Secondary | ICD-10-CM

## 2024-07-31 DIAGNOSIS — I10 Essential (primary) hypertension: Secondary | ICD-10-CM

## 2024-07-31 DIAGNOSIS — I6523 Occlusion and stenosis of bilateral carotid arteries: Secondary | ICD-10-CM

## 2024-07-31 DIAGNOSIS — F1010 Alcohol abuse, uncomplicated: Secondary | ICD-10-CM

## 2024-07-31 DIAGNOSIS — E785 Hyperlipidemia, unspecified: Secondary | ICD-10-CM

## 2024-08-01 ENCOUNTER — Telehealth: Payer: Self-pay | Admitting: Cardiology

## 2024-08-01 ENCOUNTER — Other Ambulatory Visit: Payer: Self-pay | Admitting: Cardiology

## 2024-08-01 ENCOUNTER — Encounter: Payer: Self-pay | Admitting: Cardiology

## 2024-08-01 MED ORDER — METOPROLOL SUCCINATE ER 50 MG PO TB24: 50.0000 mg | ORAL_TABLET | Freq: Every day | ORAL | 4 refills | Status: AC

## 2024-08-01 MED ORDER — AMLODIPINE BESYLATE 5 MG PO TABS: 5.0000 mg | ORAL_TABLET | Freq: Every day | ORAL | 4 refills | Status: AC

## 2024-08-01 NOTE — Telephone Encounter (Signed)
*  STAT* If patient is at the pharmacy, call can be transferred to refill team.   1. Which medications need to be refilled? (please list name of each medication and dose if known)   amLODipine  (NORVASC ) 5 MG tablet  metoprolol  succinate (TOPROL -XL) 50 MG 24 hr tablet    2. Would you like to learn more about the convenience, safety, & potential cost savings by using the [facility]? No   3. Are you open to using the [facility] (Type [facility]. No    4. Which pharmacy/location (including street and city if local pharmacy) is medication to be sent to? [facility] - [place], [place] - [address]    5. Do they need a 30 day or 90 day supply? 30 day

## 2024-08-01 NOTE — Telephone Encounter (Signed)
 error

## 2024-08-01 NOTE — Telephone Encounter (Signed)
 Filled

## 2024-08-14 ENCOUNTER — Other Ambulatory Visit: Payer: Self-pay | Admitting: Cardiology

## 2024-08-14 NOTE — Telephone Encounter (Signed)
 Prescription refill request for Eliquis  received. Indication:cad Last office visit:upcoming Scr:1.26  12/24 Age: 68 Weight:109  kg  Prescription refilled

## 2024-09-20 ENCOUNTER — Ambulatory Visit: Attending: Student | Admitting: Student

## 2024-09-20 NOTE — Progress Notes (Deleted)
 Cardiology Office Note    Date:  09/20/2024  ID:  [name], DOB 12-06-1955, MRN [id number] Cardiologist: Alvan Carrier, MD { :  History of Present Illness:    [name] is a 68 y.o. 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  He was last exam by Rosaline Fryer, PA in 11/2022 and denied any recent anginal symptoms at that time.  Was consuming 1 to 2 quarts of beer daily along with a pint of liquor on the weekends.  The importance of reducing alcohol intake was reviewed.   In the interim, he was admitted to the hospital in 10/2023 for evaluation of dizziness and found to have acute left bilateral ischemic infarcts by Brain MRI. Was also diagnosed with new-onset atrial fibrillation during admission and was started on Eliquis  5 mg twice daily for anticoagulation with Plavix being discontinued.  Echocardiogram during admission showed a low normal EF of 50 to 55% with no regional wall motion abnormalities, severe LVH, normal RV function and no significant valve abnormalities. He had follow-up visits scheduled in 11/2023, 05/26/2024 and 07/27/2024 but no-showed for these.  - CBC, CMET, FLP  ROS: ***  Studies Reviewed:   EKG: EKG is*** ordered today and demonstrates ***   EKG Interpretation Date/Time:    Ventricular Rate:    PR Interval:    QRS Duration:    QT Interval:    QTC Calculation:   R Axis:      Text Interpretation:         Echocardiogram: 10/2023 IMPRESSIONS     1. Calcified papillary muscle. Technically difficult study despite  contrast use.. Left ventricular ejection fraction, by estimation, is 50 to  55%. The left  ventricle has low normal function. The left ventricle has no  regional wall motion abnormalities.  There is severe concentric left ventricular hypertrophy. Left ventricular  diastolic parameters are indeterminate.   2. Right ventricular systolic function is normal. The right ventricular  size is mildly enlarged.   3. Right atrial size was mildly dilated.   4. The mitral valve is grossly normal. No evidence of mitral valve  regurgitation.   5. The aortic valve is tricuspid. Aortic valve regurgitation is not  visualized. No aortic stenosis is present.   6. The inferior vena cava is normal in size with greater than 50%  respiratory variability, suggesting right atrial pressure of 3 mmHg.   Comparison(s): Unable to access 2012 study.    Risk Assessment/Calculations:   {Does this patient have ATRIAL FIBRILLATION?:[phone]} No BP recorded.  {Refresh Note OR Click here to enter BP  :1}***         Physical Exam:   VS:  There were no vitals taken for this visit.   Wt Readings from Last 3 Encounters:  05/06/24 240 lb 4.8 oz (109 kg)  10/27/23 240 lb (108.9 kg)  11/14/22 240 lb (108.9 kg)     GEN: Well nourished, well developed in no acute distress NECK: No JVD; No carotid bruits CARDIAC: ***RRR, no murmurs, rubs, gallops RESPIRATORY:  Clear to auscultation without rales, wheezing or rhonchi  ABDOMEN: Appears non-distended. No obvious abdominal masses. EXTREMITIES: No clubbing or cyanosis. No edema.  Distal pedal pulses are 2+ bilaterally.   Assessment and Plan:   1. Coronary artery disease involving coronary bypass graft of native heart without angina pectoris - He previously underwent CABG in 2009 with details as outlined above.  2. PAF (paroxysmal atrial fibrillation) (HCC) - He was diagnosed with the arrhythmia during his admission in 10/2023 after presenting with dizziness and found to have a CVA. - ***  3. Essential hypertension - BP is at *** during today's visit.    4. Mixed hyperlipidemia - FLP in 10/2023 showed total cholesterol 107, triglycerides 183, HDL 38 and LDL 48. ***  5. Bilateral carotid artery stenosis - Dopplers in 10/2023 showed 1 to 49% stenosis bilaterally.   Signed, Laymon CHRISTELLA Qua, PA-C

## 2024-11-18 ENCOUNTER — Other Ambulatory Visit: Payer: Self-pay | Admitting: Cardiology

## 2024-11-18 NOTE — Telephone Encounter (Signed)
 Prescription refill request for Eliquis  received. Indication: Last office visit:needs appt Scr: Age:  Weight:  Prescription refilled
